# Patient Record
Sex: Male | Born: 1970
Health system: Southern US, Community
[De-identification: ages and names within clinical notes are randomized; demographics above are authoritative.]

## PROBLEM LIST (undated history)

## (undated) DIAGNOSIS — I6529 Occlusion and stenosis of unspecified carotid artery: Secondary | ICD-10-CM

## (undated) DIAGNOSIS — E7800 Pure hypercholesterolemia, unspecified: Secondary | ICD-10-CM

## (undated) DIAGNOSIS — T7840XA Allergy, unspecified, initial encounter: Secondary | ICD-10-CM

## (undated) DIAGNOSIS — Z720 Tobacco use: Secondary | ICD-10-CM

## (undated) DIAGNOSIS — I739 Peripheral vascular disease, unspecified: Secondary | ICD-10-CM

## (undated) DIAGNOSIS — I2510 Atherosclerotic heart disease of native coronary artery without angina pectoris: Secondary | ICD-10-CM

## (undated) DIAGNOSIS — K219 Gastro-esophageal reflux disease without esophagitis: Secondary | ICD-10-CM

## (undated) DIAGNOSIS — I10 Essential (primary) hypertension: Secondary | ICD-10-CM

## (undated) DIAGNOSIS — I219 Acute myocardial infarction, unspecified: Secondary | ICD-10-CM

## (undated) HISTORY — DX: Occlusion and stenosis of unspecified carotid artery: I65.29

## (undated) HISTORY — PX: LACERATION REPAIR: SHX5168

## (undated) HISTORY — DX: Tobacco use: Z72.0

## (undated) HISTORY — DX: Allergy, unspecified, initial encounter: T78.40XA

## (undated) HISTORY — DX: Peripheral vascular disease, unspecified: I73.9

---

## 2001-01-14 ENCOUNTER — Ambulatory Visit (HOSPITAL_COMMUNITY): Admission: RE | Admit: 2001-01-14 | Discharge: 2001-01-14 | Payer: Self-pay | Admitting: Family Medicine

## 2001-01-14 ENCOUNTER — Encounter: Payer: Self-pay | Admitting: Family Medicine

## 2006-12-12 ENCOUNTER — Emergency Department (HOSPITAL_COMMUNITY): Admission: EM | Admit: 2006-12-12 | Discharge: 2006-12-13 | Payer: Self-pay | Admitting: Emergency Medicine

## 2007-11-06 ENCOUNTER — Emergency Department (HOSPITAL_COMMUNITY): Admission: EM | Admit: 2007-11-06 | Discharge: 2007-11-07 | Payer: Self-pay | Admitting: Emergency Medicine

## 2010-10-20 LAB — POCT I-STAT, CHEM 8
BUN: 11
Calcium, Ion: 1.19
Chloride: 105
Creatinine, Ser: 1.2
Glucose, Bld: 102 — ABNORMAL HIGH
HCT: 45
Hemoglobin: 15.3
Potassium: 4
Sodium: 141
TCO2: 31

## 2010-10-28 LAB — BASIC METABOLIC PANEL
BUN: 7
CO2: 28
Calcium: 8.6
Chloride: 104
Creatinine, Ser: 1.12
GFR calc Af Amer: >60
GFR calc non Af Amer: >60
Glucose, Bld: 105 — ABNORMAL HIGH
Potassium: 3.7
Sodium: 139

## 2010-10-28 LAB — DIFFERENTIAL
Basophils Absolute: 0.1
Basophils Relative: 1
Eosinophils Absolute: 0.1 — ABNORMAL LOW
Eosinophils Relative: 1
Lymphocytes Relative: 18
Lymphs Abs: 1.8
Monocytes Absolute: 1.3 — ABNORMAL HIGH
Monocytes Relative: 13 — ABNORMAL HIGH
Neutro Abs: 6.5
Neutrophils Relative %: 67

## 2010-10-28 LAB — CBC
HCT: 46.6
Hemoglobin: 16.2
MCHC: 34.7
MCV: 85.7
Platelets: 183
RBC: 5.44
RDW: 13.9
WBC: 9.7

## 2012-07-14 ENCOUNTER — Ambulatory Visit: Payer: Self-pay | Admitting: Family Medicine

## 2012-07-14 VITALS — BP 110/66 | HR 81 | Temp 97.9°F | Resp 16 | Ht 69.25 in | Wt 177.2 lb

## 2012-07-14 DIAGNOSIS — Z0000 Encounter for general adult medical examination without abnormal findings: Secondary | ICD-10-CM

## 2012-07-14 DIAGNOSIS — Z0289 Encounter for other administrative examinations: Secondary | ICD-10-CM

## 2012-07-14 NOTE — Patient Instructions (Addendum)
Good to see you today!  You did have trace protein in your urine- please follow up with your regular doctor.  You should have a blood test to check your kidney function

## 2012-07-14 NOTE — Progress Notes (Signed)
[facility] [address] [place] [address] [phone]  Date:  07/14/2012   Name:  [name] [name]   DOB:  Jun 18, 1970   MRN:  [id number]  PCP:  No PCP Per Patient    Chief Complaint: dot   History of Present Illness:  [name] [name] is a 42 y.o. very pleasant male patient who presents with the following:  Here today for a DOT exam.  He has a history of AR and uses nasacort- otherwise quite healhty.   He is a long distance Naval architect.   Pan negative health history on DOT form   There are no active problems to display for this patient.   Past Medical History  Diagnosis Date  . Allergy     History reviewed. No pertinent past surgical history.  History  Substance Use Topics  . Smoking status: Former Games developer  . Smokeless tobacco: Not on file     Comment: uses e cigarette  . Alcohol Use: No    Family History  Problem Relation Age of Onset  . Cancer Father     Allergies  Allergen Reactions  . Codeine     Migraine     Medication list has been reviewed and updated.  No current outpatient prescriptions on file prior to visit.   No current facility-administered medications on file prior to visit.    Review of Systems:  As per HPI- otherwise negative.   Physical Examination: Filed Vitals:   07/14/12 0812  BP: 110/66  Pulse: 81  Temp: 97.9 F (36.6 C)  Resp: 16   Filed Vitals:   07/14/12 0812  Height: 5' 9.25" (1.759 m)  Weight: 177 lb 3.2 oz (80.377 kg)   Body mass index is 25.98 kg/(m^2). Ideal Body Weight: Weight in (lb) to have BMI = 25: 170.2  GEN: WDWN, NAD, Non-toxic, A & O x 3 HEENT: Atraumatic, Normocephalic. Neck supple. No masses, No LAD.  Bilateral TM wnl, oropharynx normal.  PEERL,EOMI.   Ears and Nose: No external deformity. CV: RRR, No M/G/R. No JVD. No thrill. No extra heart sounds. PULM: CTA B, no wheezes, crackles, rhonchi. No retractions. No resp. distress. No accessory muscle use. ABD: S, NT, ND,  +BS. No rebound. No HSM. EXTR: No c/c/e.  Normal strength, ROM and DTR all extremities  NEURO Normal gait.  PSYCH: Normally interactive. Conversant. Not depressed or anxious appearing.  Calm demeanor.  No inguinal hernia  Assessment and Plan: Physical exam  Cleared for 3 year DOT card.  Follow- up trace proteinuria with personal physician.    Signed Abbe Amsterdam, MD

## 2012-08-04 ENCOUNTER — Encounter: Payer: Self-pay | Admitting: Family Medicine

## 2013-03-28 ENCOUNTER — Ambulatory Visit (INDEPENDENT_AMBULATORY_CARE_PROVIDER_SITE_OTHER): Payer: BC Managed Care – PPO | Admitting: Internal Medicine

## 2013-03-28 VITALS — BP 126/80 | HR 67 | Temp 98.6°F | Resp 16 | Ht 69.25 in | Wt 167.8 lb

## 2013-03-28 DIAGNOSIS — F17200 Nicotine dependence, unspecified, uncomplicated: Secondary | ICD-10-CM

## 2013-03-28 DIAGNOSIS — R0989 Other specified symptoms and signs involving the circulatory and respiratory systems: Secondary | ICD-10-CM

## 2013-03-28 DIAGNOSIS — R002 Palpitations: Secondary | ICD-10-CM

## 2013-03-28 DIAGNOSIS — R0602 Shortness of breath: Secondary | ICD-10-CM

## 2013-03-28 LAB — POCT CBC
Granulocyte percent: 64.9 %G (ref 37–80)
HCT, POC: 49.7 % (ref 43.5–53.7)
HEMOGLOBIN: 16.1 g/dL (ref 14.1–18.1)
Lymph, poc: 3.1 (ref 0.6–3.4)
MCH: 28.9 pg (ref 27–31.2)
MCHC: 32.4 g/dL (ref 31.8–35.4)
MCV: 89.3 fL (ref 80–97)
MID (CBC): 0.9 (ref 0–0.9)
MPV: 9.1 fL (ref 0–99.8)
PLATELET COUNT, POC: 266 10*3/uL (ref 142–424)
POC GRANULOCYTE: 7.4 — AB (ref 2–6.9)
POC LYMPH PERCENT: 27.5 %L (ref 10–50)
POC MID %: 7.6 % (ref 0–12)
RBC: 5.57 M/uL (ref 4.69–6.13)
RDW, POC: 14.7 %
WBC: 11.4 10*3/uL — AB (ref 4.6–10.2)

## 2013-03-28 NOTE — Progress Notes (Signed)
   Subjective:    Patient ID: [name] [name], male    DOB: 02/05/1970, 43 y.o.   MRN: [id number]  HPI 43 year old male presents with palpitations and SOB. No intense chest pain; sometimes he will get a feeling in his chest like someone is poking or a small shock to his chest. Pt has previously had them off and on, but they have been steady for the past three weeks. No hx of hypertension. Pt takes no medications. He currently smokes 2 cigarettes a day then uses an e-cig the rest of the day. No known heart disease in his family hx. When he was 13 or 14 they said he thought he had a heart murmur. They ran tests on him and said they could not find the heart murmur so they cleared him.   fhx of stroke Review of Systems     Objective:   Physical Exam  Vitals reviewed. Constitutional: He is oriented to person, place, and time. He appears well-developed and well-nourished. No distress.  HENT:  Head: Normocephalic.  Eyes: EOM are normal. Pupils are equal, round, and reactive to light.  Neck: Normal range of motion. Neck supple.  Cardiovascular: Regular rhythm, normal heart sounds and intact distal pulses.  Tachycardia present.   3/6 left carotid bruit 4/6 left femoral bruit 3/6 lower abdominal bruit  Pulmonary/Chest: Effort normal and breath sounds normal.  Abdominal: Soft. He exhibits no mass. There is no tenderness.  Neurological: He is alert and oriented to person, place, and time. No cranial nerve deficit. He exhibits normal muscle tone. Coordination normal.  Psychiatric: He has a normal mood and affect.   Screening labs       Assessment & Plan:  Scattered vascular bruits Palpitations/PR interval .12 Smoker/SOB intermittent Refer to cardiology. Quit smoking

## 2013-03-28 NOTE — Patient Instructions (Signed)
Palpitations  A palpitation is the feeling that your heartbeat is irregular or is faster than normal. It may feel like your heart is fluttering or skipping a beat. Palpitations are usually not a serious problem. However, in some cases, you may need further medical evaluation. CAUSES  Palpitations can be caused by:  Smoking.  Caffeine or other stimulants, such as diet pills or energy drinks.  Alcohol.  Stress and anxiety.  Strenuous physical activity.  Fatigue.  Certain medicines.  Heart disease, especially if you have a history of arrhythmias. This includes atrial fibrillation, atrial flutter, or supraventricular tachycardia.  An improperly working pacemaker or defibrillator. DIAGNOSIS  To find the cause of your palpitations, your caregiver will take your history and perform a physical exam. Tests may also be done, including:  Electrocardiography (ECG). This test records the heart's electrical activity.  Cardiac monitoring. This allows your caregiver to monitor your heart rate and rhythm in real time.  Holter monitor. This is a portable device that records your heartbeat and can help diagnose heart arrhythmias. It allows your caregiver to track your heart activity for several days, if needed.  Stress tests by exercise or by giving medicine that makes the heart beat faster. TREATMENT  Treatment of palpitations depends on the cause of your symptoms and can vary greatly. Most cases of palpitations do not require any treatment other than time, relaxation, and monitoring your symptoms. Other causes, such as atrial fibrillation, atrial flutter, or supraventricular tachycardia, usually require further treatment. HOME CARE INSTRUCTIONS   Avoid:  Caffeinated coffee, tea, soft drinks, diet pills, and energy drinks.  Chocolate.  Alcohol.  Stop smoking if you smoke.  Reduce your stress and anxiety. Things that can help you relax include:  A method that measures bodily functions so  you can learn to control them (biofeedback).  Yoga.  Meditation.  Physical activity such as swimming, jogging, or walking.  Get plenty of rest and sleep. SEEK MEDICAL CARE IF:   You continue to have a fast or irregular heartbeat beyond 24 hours.  Your palpitations occur more often. SEEK IMMEDIATE MEDICAL CARE IF:  You develop chest pain or shortness of breath.  You have a severe headache.  You feel dizzy, or you faint. MAKE SURE YOU:  Understand these instructions.  Will watch your condition.  Will get help right away if you are not doing well or get worse. Document Released: 01/03/2000 Document Revised: 05/02/2012 Document Reviewed: 03/06/2011 [facility] Patient Information 2014 [place], [place]. Smoking Cessation Quitting smoking is important to your health and has many advantages. However, it is not always easy to quit since nicotine is a very addictive drug. Often times, people try 3 times or more before being able to quit. This document explains the best ways for you to prepare to quit smoking. Quitting takes hard work and a lot of effort, but you can do it. ADVANTAGES OF QUITTING SMOKING  You will live longer, feel better, and live better.  Your body will feel the impact of quitting smoking almost immediately.  Within 20 minutes, blood pressure decreases. Your pulse returns to its normal level.  After 8 hours, carbon monoxide levels in the blood return to normal. Your oxygen level increases.  After 24 hours, the chance of having a heart attack starts to decrease. Your breath, hair, and body stop smelling like smoke.  After 48 hours, damaged nerve endings begin to recover. Your sense of taste and smell improve.  After 72 hours, the body is virtually free  of nicotine. Your bronchial tubes relax and breathing becomes easier.  After 2 to 12 weeks, lungs can hold more air. Exercise becomes easier and circulation improves.  The risk of having a heart attack, stroke,  cancer, or lung disease is greatly reduced.  After 1 year, the risk of coronary heart disease is cut in half.  After 5 years, the risk of stroke falls to the same as a nonsmoker.  After 10 years, the risk of lung cancer is cut in half and the risk of other cancers decreases significantly.  After 15 years, the risk of coronary heart disease drops, usually to the level of a nonsmoker.  If you are pregnant, quitting smoking will improve your chances of having a healthy baby.  The people you live with, especially any children, will be healthier.  You will have extra money to spend on things other than cigarettes. QUESTIONS TO THINK ABOUT BEFORE ATTEMPTING TO QUIT You may want to talk about your answers with your caregiver.  Why do you want to quit?  If you tried to quit in the past, what helped and what did not?  What will be the most difficult situations for you after you quit? How will you plan to handle them?  Who can help you through the tough times? Your family? Friends? A caregiver?  What pleasures do you get from smoking? What ways can you still get pleasure if you quit? Here are some questions to ask your caregiver:  How can you help me to be successful at quitting?  What medicine do you think would be best for me and how should I take it?  What should I do if I need more help?  What is smoking withdrawal like? How can I get information on withdrawal? GET READY  Set a quit date.  Change your environment by getting rid of all cigarettes, ashtrays, matches, and lighters in your home, car, or work. Do not let people smoke in your home.  Review your past attempts to quit. Think about what worked and what did not. GET SUPPORT AND ENCOURAGEMENT You have a better chance of being successful if you have help. You can get support in many ways.  Tell your family, friends, and co-workers that you are going to quit and need their support. Ask them not to smoke around you.  Get  individual, group, or telephone counseling and support. Programs are available at [facility] and health centers. Call your local health department for information about programs in your area.  Spiritual beliefs and practices may help some smokers quit.  Download a "quit meter" on your computer to keep track of quit statistics, such as how long you have gone without smoking, cigarettes not smoked, and money saved.  Get a self-help book about quitting smoking and staying off of tobacco. LEARN NEW SKILLS AND BEHAVIORS  Distract yourself from urges to smoke. Talk to someone, go for a walk, or occupy your time with a task.  Change your normal routine. Take a different route to work. Drink tea instead of coffee. Eat breakfast in a different place.  Reduce your stress. Take a hot bath, exercise, or read a book.  Plan something enjoyable to do every day. Reward yourself for not smoking.  Explore interactive web-based programs that specialize in helping you quit. GET MEDICINE AND USE IT CORRECTLY Medicines can help you stop smoking and decrease the urge to smoke. Combining medicine with the above behavioral methods and support can greatly increase  your chances of successfully quitting smoking.  Nicotine replacement therapy helps deliver nicotine to your body without the negative effects and risks of smoking. Nicotine replacement therapy includes nicotine gum, lozenges, inhalers, nasal sprays, and skin patches. Some may be available over-the-counter and others require a prescription.  Antidepressant medicine helps people abstain from smoking, but how this works is unknown. This medicine is available by prescription.  Nicotinic receptor partial agonist medicine simulates the effect of nicotine in your brain. This medicine is available by prescription. Ask your caregiver for advice about which medicines to use and how to use them based on your health history. Your caregiver will tell you what side  effects to look out for if you choose to be on a medicine or therapy. Carefully read the information on the package. Do not use any other product containing nicotine while using a nicotine replacement product.  RELAPSE OR DIFFICULT SITUATIONS Most relapses occur within the first 3 months after quitting. Do not be discouraged if you start smoking again. Remember, most people try several times before finally quitting. You may have symptoms of withdrawal because your body is used to nicotine. You may crave cigarettes, be irritable, feel very hungry, cough often, get headaches, or have difficulty concentrating. The withdrawal symptoms are only temporary. They are strongest when you first quit, but they will go away within 10 14 days. To reduce the chances of relapse, try to:  Avoid drinking alcohol. Drinking lowers your chances of successfully quitting.  Reduce the amount of caffeine you consume. Once you quit smoking, the amount of caffeine in your body increases and can give you symptoms, such as a rapid heartbeat, sweating, and anxiety.  Avoid smokers because they can make you want to smoke.  Do not let weight gain distract you. Many smokers will gain weight when they quit, usually less than 10 pounds. Eat a healthy diet and stay active. You can always lose the weight gained after you quit.  Find ways to improve your mood other than smoking. FOR MORE INFORMATION  www.smokefree.gov  Document Released: 12/30/2000 Document Revised: 07/07/2011 Document Reviewed: 04/16/2011 [facility] Patient Information 2014 [place], [place].

## 2013-03-29 LAB — COMPREHENSIVE METABOLIC PANEL
ALT: 27 U/L (ref 0–53)
AST: 103 U/L — AB (ref 0–37)
Albumin: 4.6 g/dL (ref 3.5–5.2)
Alkaline Phosphatase: 98 U/L (ref 39–117)
BILIRUBIN TOTAL: 0.6 mg/dL (ref 0.2–1.2)
BUN: 11 mg/dL (ref 6–23)
CALCIUM: 9 mg/dL (ref 8.4–10.5)
CHLORIDE: 104 meq/L (ref 96–112)
CO2: 25 mEq/L (ref 19–32)
CREATININE: 1.04 mg/dL (ref 0.50–1.35)
Glucose, Bld: 95 mg/dL (ref 70–99)
Potassium: 4.6 mEq/L (ref 3.5–5.3)
SODIUM: 139 meq/L (ref 135–145)
TOTAL PROTEIN: 7.5 g/dL (ref 6.0–8.3)

## 2013-03-29 LAB — LIPID PANEL
CHOL/HDL RATIO: 6.6 ratio
Cholesterol: 210 mg/dL — ABNORMAL HIGH (ref 0–200)
HDL: 32 mg/dL — AB (ref >39)
LDL CALC: 137 mg/dL — AB (ref 0–99)
Triglycerides: 203 mg/dL — ABNORMAL HIGH (ref <150)
VLDL: 41 mg/dL — ABNORMAL HIGH (ref 0–40)

## 2013-03-29 LAB — TSH: TSH: 1.885 u[IU]/mL (ref 0.350–4.500)

## 2013-04-10 ENCOUNTER — Emergency Department (HOSPITAL_COMMUNITY): Payer: BC Managed Care – PPO

## 2013-04-10 ENCOUNTER — Encounter (HOSPITAL_COMMUNITY): Payer: Self-pay | Admitting: Emergency Medicine

## 2013-04-10 DIAGNOSIS — R002 Palpitations: Secondary | ICD-10-CM | POA: Insufficient documentation

## 2013-04-10 DIAGNOSIS — F17200 Nicotine dependence, unspecified, uncomplicated: Secondary | ICD-10-CM | POA: Insufficient documentation

## 2013-04-10 DIAGNOSIS — IMO0002 Reserved for concepts with insufficient information to code with codable children: Secondary | ICD-10-CM | POA: Insufficient documentation

## 2013-04-10 DIAGNOSIS — Z79899 Other long term (current) drug therapy: Secondary | ICD-10-CM | POA: Insufficient documentation

## 2013-04-10 DIAGNOSIS — J40 Bronchitis, not specified as acute or chronic: Secondary | ICD-10-CM | POA: Insufficient documentation

## 2013-04-10 NOTE — ED Notes (Signed)
Pt. reports palpitations " heart racing " with left chest discomfort onset this evening  , no chest pain , slight SOB and occasional dry cough .

## 2013-04-11 ENCOUNTER — Emergency Department (HOSPITAL_COMMUNITY)
Admission: EM | Admit: 2013-04-11 | Discharge: 2013-04-11 | Disposition: A | Discharge status: Home / Self Care | Payer: BC Managed Care – PPO | Attending: Emergency Medicine | Admitting: Emergency Medicine

## 2013-04-11 DIAGNOSIS — J40 Bronchitis, not specified as acute or chronic: Secondary | ICD-10-CM

## 2013-04-11 DIAGNOSIS — R002 Palpitations: Secondary | ICD-10-CM

## 2013-04-11 LAB — CBC WITH DIFFERENTIAL/PLATELET
BASOS ABS: 0 10*3/uL (ref 0.0–0.1)
BASOS PCT: 0 % (ref 0–1)
EOS ABS: 0.4 10*3/uL (ref 0.0–0.7)
Eosinophils Relative: 3 % (ref 0–5)
HEMATOCRIT: 41 % (ref 39.0–52.0)
HEMOGLOBIN: 14.3 g/dL (ref 13.0–17.0)
LYMPHS ABS: 3.3 10*3/uL (ref 0.7–4.0)
Lymphocytes Relative: 23 % (ref 12–46)
MCH: 29.4 pg (ref 26.0–34.0)
MCHC: 34.9 g/dL (ref 30.0–36.0)
MCV: 84.4 fL (ref 78.0–100.0)
MONO ABS: 1 10*3/uL (ref 0.1–1.0)
Monocytes Relative: 7 % (ref 3–12)
NEUTROS ABS: 9.5 10*3/uL — AB (ref 1.7–7.7)
Neutrophils Relative %: 67 % (ref 43–77)
Platelets: 223 10*3/uL (ref 150–400)
RBC: 4.86 MIL/uL (ref 4.22–5.81)
RDW: 14.2 % (ref 11.5–15.5)
WBC: 14.2 10*3/uL — ABNORMAL HIGH (ref 4.0–10.5)

## 2013-04-11 LAB — I-STAT CHEM 8, ED
BUN: 14 mg/dL (ref 6–23)
CALCIUM ION: 1.2 mmol/L (ref 1.12–1.23)
CREATININE: 1.3 mg/dL (ref 0.50–1.35)
Chloride: 103 mEq/L (ref 96–112)
GLUCOSE: 95 mg/dL (ref 70–99)
HEMATOCRIT: 43 % (ref 39.0–52.0)
HEMOGLOBIN: 14.6 g/dL (ref 13.0–17.0)
Potassium: 4 mEq/L (ref 3.7–5.3)
Sodium: 143 mEq/L (ref 137–147)
TCO2: 28 mmol/L (ref 0–100)

## 2013-04-11 LAB — I-STAT TROPONIN, ED: Troponin i, poc: 0 ng/mL (ref 0.00–0.08)

## 2013-04-11 MED ORDER — PREDNISONE 50 MG PO TABS: 50.0000 mg | ORAL_TABLET | Freq: Every day | ORAL | Status: DC

## 2013-04-11 MED ORDER — ALBUTEROL SULFATE HFA 108 (90 BASE) MCG/ACT IN AERS
1.0000 | INHALATION_SPRAY | Freq: Four times a day (QID) | RESPIRATORY_TRACT | Status: DC | PRN
Start: 2013-04-11 — End: 2013-12-29

## 2013-04-11 NOTE — Discharge Instructions (Signed)
We saw you in the ER for the palpitations.  All the results in the ER are normal, labs and imaging - EXCEPT FOR THE XRAY SHOWS POSSIBLE BRONCHITIS LIKE FINDINGS. If you are having cough, wheezing - take the medications prescribed - otherwise, no need to fill those prescription. We are not sure what is causing your palpitations. The workup in the ER is not complete, and is limited to screening for life threatening and emergent conditions only, so please see a primary care doctor for further evaluation.   Bronchitis Bronchitis is inflammation of the airways that extend from the windpipe into the lungs (bronchi). The inflammation often causes mucus to develop, which leads to a cough. If the inflammation becomes severe, it may cause shortness of breath. CAUSES  Bronchitis may be caused by:   Viral infections.   Bacteria.   Cigarette smoke.   Allergens, pollutants, and other irritants.  SIGNS AND SYMPTOMS  The most common symptom of bronchitis is a frequent cough that produces mucus. Other symptoms include:  Fever.   Body aches.   Chest congestion.   Chills.   Shortness of breath.   Sore throat.  DIAGNOSIS  Bronchitis is usually diagnosed through a medical history and physical exam. Tests, such as chest X-rays, are sometimes done to rule out other conditions.  TREATMENT  You may need to avoid contact with whatever caused the problem (smoking, for example). Medicines are sometimes needed. These may include:  Antibiotics. These may be prescribed if the condition is caused by bacteria.  Cough suppressants. These may be prescribed for relief of cough symptoms.   Inhaled medicines. These may be prescribed to help open your airways and make it easier for you to breathe.   Steroid medicines. These may be prescribed for those with recurrent (chronic) bronchitis. HOME CARE INSTRUCTIONS  Get plenty of rest.   Drink enough fluids to keep your urine clear or pale yellow  (unless you have a medical condition that requires fluid restriction). Increasing fluids may help thin your secretions and will prevent dehydration.   Only take over-the-counter or prescription medicines as directed by your health care provider.  Only take antibiotics as directed. Make sure you finish them even if you start to feel better.  Avoid secondhand smoke, irritating chemicals, and strong fumes. These will make bronchitis worse. If you are a smoker, quit smoking. Consider using nicotine gum or skin patches to help control withdrawal symptoms. Quitting smoking will help your lungs heal faster.   Put a cool-mist humidifier in your bedroom at night to moisten the air. This may help loosen mucus. Change the water in the humidifier daily. You can also run the hot water in your shower and sit in the bathroom with the door closed for 5 10 minutes.   Follow up with your health care provider as directed.   Wash your hands frequently to avoid catching bronchitis again or spreading an infection to others.  SEEK MEDICAL CARE IF: Your symptoms do not improve after 1 week of treatment.  SEEK IMMEDIATE MEDICAL CARE IF:  Your fever increases.  You have chills.   You have chest pain.   You have worsening shortness of breath.   You have bloody sputum.  You faint.  You have lightheadedness.  You have a severe headache.   You vomit repeatedly. MAKE SURE YOU:   Understand these instructions.  Will watch your condition.  Will get help right away if you are not doing well or get worse. Document  Released: 01/05/2005 Document Revised: 10/26/2012 Document Reviewed: 08/30/2012 [facility] Patient Information 2014 [place], [place].  Palpitations  A palpitation is the feeling that your heartbeat is irregular or is faster than normal. It may feel like your heart is fluttering or skipping a beat. Palpitations are usually not a serious problem. However, in some cases, you may need further  medical evaluation. CAUSES  Palpitations can be caused by:  Smoking.  Caffeine or other stimulants, such as diet pills or energy drinks.  Alcohol.  Stress and anxiety.  Strenuous physical activity.  Fatigue.  Certain medicines.  Heart disease, especially if you have a history of arrhythmias. This includes atrial fibrillation, atrial flutter, or supraventricular tachycardia.  An improperly working pacemaker or defibrillator. DIAGNOSIS  To find the cause of your palpitations, your caregiver will take your history and perform a physical exam. Tests may also be done, including:  Electrocardiography (ECG). This test records the heart's electrical activity.  Cardiac monitoring. This allows your caregiver to monitor your heart rate and rhythm in real time.  Holter monitor. This is a portable device that records your heartbeat and can help diagnose heart arrhythmias. It allows your caregiver to track your heart activity for several days, if needed.  Stress tests by exercise or by giving medicine that makes the heart beat faster. TREATMENT  Treatment of palpitations depends on the cause of your symptoms and can vary greatly. Most cases of palpitations do not require any treatment other than time, relaxation, and monitoring your symptoms. Other causes, such as atrial fibrillation, atrial flutter, or supraventricular tachycardia, usually require further treatment. HOME CARE INSTRUCTIONS   Avoid:  Caffeinated coffee, tea, soft drinks, diet pills, and energy drinks.  Chocolate.  Alcohol.  Stop smoking if you smoke.  Reduce your stress and anxiety. Things that can help you relax include:  A method that measures bodily functions so you can learn to control them (biofeedback).  Yoga.  Meditation.  Physical activity such as swimming, jogging, or walking.  Get plenty of rest and sleep. SEEK MEDICAL CARE IF:   You continue to have a fast or irregular heartbeat beyond 24  hours.  Your palpitations occur more often. SEEK IMMEDIATE MEDICAL CARE IF:  You develop chest pain or shortness of breath.  You have a severe headache.  You feel dizzy, or you faint. MAKE SURE YOU:  Understand these instructions.  Will watch your condition.  Will get help right away if you are not doing well or get worse. Document Released: 01/03/2000 Document Revised: 05/02/2012 Document Reviewed: 03/06/2011 [facility] Patient Information 2014 [place], [place].

## 2013-04-13 NOTE — ED Provider Notes (Signed)
CSN: [id number]     Arrival date & time 04/10/13  2229 History   First MD Initiated Contact with Patient 04/11/13 0015     Chief Complaint  Patient presents with  . Palpitations     (Consider location/radiation/quality/duration/timing/severity/associated sxs/prior Treatment) Patient is a 43 y.o. male presenting with palpitations. The history is provided by the patient.  Palpitations Palpitations quality:  Fast Onset quality:  Sudden Timing:  Constant Progression:  Unable to specify Chronicity:  New Context: not anxiety, not appetite suppressants, not caffeine, not exercise, not illicit drugs, not nicotine and not stimulant use   Relieved by:  Nothing Ineffective treatments:  None tried Associated symptoms: chest pain   Associated symptoms: no cough, no diaphoresis, no dizziness, no leg pain, no orthopnea, no PND and no shortness of breath     Past Medical History  Diagnosis Date  . Allergy    History reviewed. No pertinent past surgical history. Family History  Problem Relation Age of Onset  . Cancer Father   . Stroke Mother    History  Substance Use Topics  . Smoking status: Current Every Day Smoker  . Smokeless tobacco: Not on file     Comment: uses e cigarette  . Alcohol Use: No    Review of Systems  Constitutional: Negative for diaphoresis, activity change and appetite change.  Respiratory: Negative for cough and shortness of breath.   Cardiovascular: Positive for chest pain and palpitations. Negative for orthopnea and PND.  Gastrointestinal: Negative for abdominal pain.  Genitourinary: Negative for dysuria.  Neurological: Negative for dizziness.      Allergies  Codeine  Home Medications   Current Outpatient Rx  Name  Route  Sig  Dispense  Refill  . atorvastatin (LIPITOR) 20 MG tablet   Oral   Take 20 mg by mouth daily.         . cilostazol (PLETAL) 100 MG tablet   Oral   Take 100 mg by mouth 2 (two) times daily.         [name]. esomeprazole  (NEXIUM) 20 MG capsule   Oral   Take 20 mg by mouth daily as needed (for stomach).         . naproxen sodium (ANAPROX) 220 MG tablet   Oral   Take 440 mg by mouth daily as needed (for pain).         [name]. albuterol (PROVENTIL HFA;VENTOLIN HFA) 108 (90 BASE) MCG/ACT inhaler   Inhalation   Inhale 1-2 puffs into the lungs every 6 (six) hours as needed for wheezing or shortness of breath.   1 Inhaler   0   . predniSONE (DELTASONE) 50 MG tablet   Oral   Take 1 tablet (50 mg total) by mouth daily.   5 tablet   0    BP 117/65  Pulse 68  Temp(Src) 98.3 F (36.8 C) (Oral)  Resp 12  Ht 5\' 9"  (1.753 m)  Wt 165 lb (74.844 kg)  BMI 24.36 kg/m2  SpO2 98% Physical Exam  Nursing note and vitals reviewed. Constitutional: He is oriented to person, place, and time. He appears well-developed.  HENT:  Head: Normocephalic and atraumatic.  Eyes: Conjunctivae and EOM are normal. Pupils are equal, round, and reactive to light.  Neck: Normal range of motion. Neck supple.  Cardiovascular: Normal rate and regular rhythm.   Pulmonary/Chest: Effort normal and breath sounds normal.  Abdominal: Soft. Bowel sounds are normal. He exhibits no distension. There is no tenderness. There is no rebound  and no guarding.  Neurological: He is alert and oriented to person, place, and time.  Skin: Skin is warm.    ED Course  Procedures (including critical care time) Labs Review Labs Reviewed  CBC WITH DIFFERENTIAL - Abnormal; Notable for the following:    WBC 14.2 (*)    Neutro Abs 9.5 (*)    All other components within normal limits  I-STAT TROPOININ, ED  I-STAT CHEM 8, ED   Imaging Review No results found.   EKG Interpretation   Date/Time:  Monday April 10 2013 22:48:38 EDT Ventricular Rate:  91 PR Interval:  118 QRS Duration: 96 QT Interval:  356 QTC Calculation: 437 R Axis:   77 Text Interpretation:  Normal sinus rhythm Cannot rule out Anterior infarct  , age undetermined Abnormal ECG  Confirmed by Rhunette Croft, MD, Janey Genta [phone])  on 04/11/2013 12:38:11 AM      MDM   Final diagnoses:  Bronchitis  Palpitations    Pt comes in with chest palpitations and chest pain. Smoker, caffeine user - no other stimulant use. No hard cardiac risk factors. No palpitations actively, with the palpitations there was no fainting.  Will d.c EKG appears reassuring.  Derwood Kaplan, MD 04/13/13 [phone]

## 2013-05-01 ENCOUNTER — Encounter (HOSPITAL_COMMUNITY): Payer: Self-pay

## 2013-05-16 ENCOUNTER — Encounter (HOSPITAL_COMMUNITY)
Admission: RE | Disposition: A | Discharge status: Home / Self Care | Payer: Self-pay | Source: Ambulatory Visit | Attending: Cardiology

## 2013-05-16 ENCOUNTER — Ambulatory Visit (HOSPITAL_COMMUNITY)
Admission: RE | Admit: 2013-05-16 | Discharge: 2013-05-16 | Disposition: A | Discharge status: Home / Self Care | Payer: BC Managed Care – PPO | Source: Ambulatory Visit | Attending: Cardiology | Admitting: Cardiology

## 2013-05-16 DIAGNOSIS — I739 Peripheral vascular disease, unspecified: Secondary | ICD-10-CM | POA: Diagnosis present

## 2013-05-16 DIAGNOSIS — I708 Atherosclerosis of other arteries: Secondary | ICD-10-CM | POA: Insufficient documentation

## 2013-05-16 DIAGNOSIS — J309 Allergic rhinitis, unspecified: Secondary | ICD-10-CM | POA: Insufficient documentation

## 2013-05-16 DIAGNOSIS — F17200 Nicotine dependence, unspecified, uncomplicated: Secondary | ICD-10-CM | POA: Insufficient documentation

## 2013-05-16 DIAGNOSIS — K219 Gastro-esophageal reflux disease without esophagitis: Secondary | ICD-10-CM | POA: Insufficient documentation

## 2013-05-16 DIAGNOSIS — E785 Hyperlipidemia, unspecified: Secondary | ICD-10-CM | POA: Insufficient documentation

## 2013-05-16 HISTORY — PX: LOWER EXTREMITY ANGIOGRAM: SHX5508

## 2013-05-16 LAB — POCT ACTIVATED CLOTTING TIME
Activated Clotting Time: 232 seconds
Activated Clotting Time: 177 seconds

## 2013-05-16 SURGERY — ANGIOGRAM, LOWER EXTREMITY
Anesthesia: LOCAL

## 2013-05-16 MED ORDER — HEPARIN SODIUM (PORCINE) 1000 UNIT/ML IJ SOLN
INTRAMUSCULAR | Status: AC
  Filled 2013-05-16: qty 1

## 2013-05-16 MED ORDER — HYDROMORPHONE HCL PF 1 MG/ML IJ SOLN
0.5000 mg | INTRAMUSCULAR | Status: DC | PRN
  Administered 2013-05-16: 0.5 mg via INTRAVENOUS

## 2013-05-16 MED ORDER — LIDOCAINE HCL (PF) 1 % IJ SOLN
INTRAMUSCULAR | Status: AC
  Filled 2013-05-16: qty 30

## 2013-05-16 MED ORDER — MIDAZOLAM HCL 2 MG/2ML IJ SOLN
INTRAMUSCULAR | Status: AC
  Filled 2013-05-16: qty 2

## 2013-05-16 MED ORDER — HEPARIN (PORCINE) IN NACL 2-0.9 UNIT/ML-% IJ SOLN
INTRAMUSCULAR | Status: AC
  Filled 2013-05-16: qty 1000

## 2013-05-16 MED ORDER — HYDROMORPHONE HCL PF 1 MG/ML IJ SOLN
INTRAMUSCULAR | Status: AC
  Filled 2013-05-16: qty 1

## 2013-05-16 MED ORDER — SODIUM CHLORIDE 0.9 % IV SOLN
INTRAVENOUS | Status: DC
  Administered 2013-05-16: 08:00:00 via INTRAVENOUS

## 2013-05-16 MED ORDER — SODIUM CHLORIDE 0.9 % IV SOLN: Freq: Once | INTRAVENOUS | Status: DC

## 2013-05-16 NOTE — Interval H&P Note (Signed)
History and Physical Interval Note:  05/16/2013 10:18 AM  [name] [name]  has presented today for surgery, with the diagnosis of pain when walking   The various methods of treatment have been discussed with the patient and family. After consideration of risks, benefits and other options for treatment, the patient has consented to  Procedure(s): LOWER EXTREMITY ANGIOGRAM (N/A) and possible PTA as a surgical intervention .  The patient's history has been reviewed, patient examined, no change in status, stable for surgery.  I have reviewed the patient's chart and labs.  Questions were answered to the patient's satisfaction.     Pamella PertJagadeesh R Young Mulvey

## 2013-05-16 NOTE — Discharge Instructions (Signed)
Angiography, Care After °Refer to this sheet in the next few weeks. These instructions provide you with information on caring for yourself after your procedure. Your health care provider may also give you more specific instructions. Your treatment has been planned according to current medical practices, but problems sometimes occur. Call your health care provider if you have any problems or questions after your procedure.  °WHAT TO EXPECT AFTER THE PROCEDURE °After your procedure, it is typical to have the following sensations: °· Minor discomfort or tenderness and a small bump at the catheter insertion site. The bump should usually decrease in size and tenderness within 1 to 2 weeks. °· Any bruising will usually fade within 2 to 4 weeks. °HOME CARE INSTRUCTIONS  °· You may need to keep taking blood thinners if they were prescribed for you. Only take over-the-counter or prescription medicines for pain, fever, or discomfort as directed by your health care provider. °· Do not apply powder or lotion to the site. °· Do not sit in a bathtub, swimming pool, or whirlpool for 5 to 7 days. °· You may shower 24 hours after the procedure. Remove the bandage (dressing) and gently wash the site with plain soap and water. Gently pat the site dry. °· Inspect the site at least twice daily. °· Limit your activity for the first 48 hours. Do not bend, squat, or lift anything over 20 lb (9 kg) or as directed by your health care provider. °· Do not drive home if you are discharged the day of the procedure. Have someone else drive you. Follow instructions about when you can drive or return to work. °SEEK MEDICAL CARE IF: °· You get lightheaded when standing up. °· You have drainage (other than a small amount of blood on the dressing). °· You have chills. °· You have a fever. °· You have redness, warmth, swelling, or pain at the insertion site. °SEEK IMMEDIATE MEDICAL CARE IF:  °· You develop chest pain or shortness of breath, feel faint,  or pass out. °· You have bleeding, swelling larger than a walnut, or drainage from the catheter insertion site. °· You develop pain, discoloration, coldness, or severe bruising in the leg or arm that held the catheter. °· You develop bleeding from any other place, such as the bowels. You may see bright red blood in your urine or stools, or your stools may appear black and tarry. °· You have heavy bleeding from the site. If this happens, hold pressure on the site. °MAKE SURE YOU: °· Understand these instructions. °· Will watch your condition. °· Will get help right away if you are not doing well or get worse. °Document Released: 07/24/2004 Document Revised: 09/07/2012 Document Reviewed: 05/30/2012 °Exit[employer]® Patient Information ©2014 [employer]. ° °

## 2013-05-16 NOTE — CV Procedure (Signed)
Procedure performed: 1. Bilateral femoral arterial access, abdominal aortogram, abdominal aortogram with bifemoral runoff. 2. PTA and stenting of the left common iliac artery with implantation of 2 overlapping 9.0 x 29 mm and a 8.0 x 19 mm Omnilink Elite  balloon expandable stent, PTA and stenting of the left external iliac artery with implantation of a self-expanding 8.0 x 18 mm absolute pro stent.  Indication: Patient is a 43 year old Caucasian male with history of tobacco use disorder who was referred for evaluation of peripheral arterial disease and chest discomfort and claudication. Outpatient Doppler evaluation had revealed severe damping flow across the left leg, suggestive of inflow disease. Hence was brought to the peripheral angiography suite to evaluate his peripheral anatomy.  Angiographic data: Abdominal aortogram revealed no significant disease in the abdominal aorta. The aortoiliac bifurcation was widely patent. There were 2 renal arteries one on either sides, widely patent. The mesenteric vessels filled quickly and did not appear to have any significant atherosclerotic changes.  Abdominal aortogram with bifemoral runoff: The right common iliac artery showed about a 20% mild luminal irregularity. Right femoral artery showed mild disease. SFA also showed mild disease. Below the knee there was two-vessel runoff in the form of peroneal and posterior tibial artery, and the tibial artery is occluded.  Left common iliac artery showed a high-grade 80-90% stenosis, left internal iliac artery was widely patent, left external iliac artery at the origin and ostium showed a high-grade 90% stenosis followed by a tandem 80% stenosis. The left femoral artery and left superficial femoral artery showed mild disease. Below the left knee there was two-vessel runoff in the form of peroneal and posterior tibial artery with an irritable artery occluded in the proximal segment.  Interventional data:  Successful  PTA and stenting of the left common iliac artery with implantation of 2 overlapping 9.0 x 29 mm and a 8.0 x 19 mm Omnilink Elite  balloon expandable stent, PTA and stenting of the left external iliac artery with implantation of a self-expanding 8.0 x 18 mm absolute pro stent. No residual stenosis was evident. Excellent result was evident.  Recommendation: Patient needs aggressive risk modification, smoking cessation. He'll be discharged home today with outpatient followup.  Technique of the procedure: Under sterile precautions using a 5 French right femoral arterial access, a 5 French Omni Flush catheter was advanced into the abdominal aorta. Abdominal aortogram was performed. The same catheter was utilized to perform abdominal aortogram with bifemoral runoff.  Technique of intervention: Using 3000 units of heparin and keeping ACT greater than 200, access the left femoral artery and a 7 French bright tip 35 cm sheath was introduced into the left common femoral artery and into the left distal iliac artery. The lesion in the common and external iliac artery was carefully evaluated, a Viper trek ruler was utilized to measure the lesion length. This was followed by stenting of the left common iliac artery with implantation of a balloon expandable stent, 9.0 x 28 mm Omnilink deployed at 10 atmospheric pressure for 60 seconds. I then implanted a self-expanding 8.0 x 18 mm absolute pro into the left external iliac artery. I spared the internal iliac artery origin. The stenosis at the site was then ballooned with the 9.0 x 28 mm Omnilink stent balloon at 2 atmosphere pressure was the self-expanding stent was deployed. The stent was dilated with the same balloon throughout the lesion length. Post-angioplasty, there was a antegrade dissection noted at the outflow of the proximal common iliac artery stent. This  was covered with a 8.0 x 19 mm Omnilink Elite balloon expandable stent which was deployed at 10 atmospheric  pressure x2. Having performed this angiography revealed excellent results. The sheath was Sutured in place., Omni flush catheter pulled out of the body over [employer] wire. All the procedure was performed via Versacore wire support. A total of 280 cc of contrast was utilized for diagnostic and interventional procedure.

## 2013-05-16 NOTE — H&P (Signed)
  Please see office visit notes for complete details of HPI.  

## 2013-05-26 ENCOUNTER — Encounter: Payer: Self-pay | Admitting: Internal Medicine

## 2013-07-18 ENCOUNTER — Encounter: Payer: Self-pay | Admitting: Internal Medicine

## 2013-12-28 ENCOUNTER — Encounter (HOSPITAL_COMMUNITY): Payer: Self-pay | Admitting: Cardiology

## 2013-12-29 ENCOUNTER — Observation Stay (HOSPITAL_COMMUNITY)
Admission: EM | Admit: 2013-12-29 | Discharge: 2013-12-29 | Disposition: A | Discharge status: Home / Self Care | Payer: BC Managed Care – PPO | Attending: Internal Medicine | Admitting: Internal Medicine

## 2013-12-29 ENCOUNTER — Emergency Department (HOSPITAL_COMMUNITY): Payer: BC Managed Care – PPO

## 2013-12-29 ENCOUNTER — Encounter (HOSPITAL_COMMUNITY): Payer: Self-pay | Admitting: Emergency Medicine

## 2013-12-29 DIAGNOSIS — R079 Chest pain, unspecified: Principal | ICD-10-CM | POA: Diagnosis present

## 2013-12-29 DIAGNOSIS — Z7982 Long term (current) use of aspirin: Secondary | ICD-10-CM | POA: Insufficient documentation

## 2013-12-29 DIAGNOSIS — Z720 Tobacco use: Secondary | ICD-10-CM | POA: Diagnosis not present

## 2013-12-29 DIAGNOSIS — Z79899 Other long term (current) drug therapy: Secondary | ICD-10-CM | POA: Insufficient documentation

## 2013-12-29 DIAGNOSIS — I739 Peripheral vascular disease, unspecified: Secondary | ICD-10-CM | POA: Diagnosis present

## 2013-12-29 DIAGNOSIS — F17200 Nicotine dependence, unspecified, uncomplicated: Secondary | ICD-10-CM | POA: Diagnosis present

## 2013-12-29 LAB — CBC
HEMATOCRIT: 40.8 % (ref 39.0–52.0)
Hemoglobin: 14.2 g/dL (ref 13.0–17.0)
MCH: 29.1 pg (ref 26.0–34.0)
MCHC: 34.8 g/dL (ref 30.0–36.0)
MCV: 83.6 fL (ref 78.0–100.0)
PLATELETS: 231 10*3/uL (ref 150–400)
RBC: 4.88 MIL/uL (ref 4.22–5.81)
RDW: 14.1 % (ref 11.5–15.5)
WBC: 22 10*3/uL — AB (ref 4.0–10.5)

## 2013-12-29 LAB — RAPID URINE DRUG SCREEN, HOSP PERFORMED
AMPHETAMINES: NOT DETECTED
Barbiturates: NOT DETECTED
Benzodiazepines: NOT DETECTED
COCAINE: NOT DETECTED
Opiates: NOT DETECTED
TETRAHYDROCANNABINOL: NOT DETECTED

## 2013-12-29 LAB — BASIC METABOLIC PANEL
Anion gap: 16 — ABNORMAL HIGH (ref 5–15)
BUN: 11 mg/dL (ref 6–23)
CO2: 22 meq/L (ref 19–32)
CREATININE: 1.13 mg/dL (ref 0.50–1.35)
Calcium: 8.8 mg/dL (ref 8.4–10.5)
Chloride: 100 mEq/L (ref 96–112)
GFR calc Af Amer: >90 mL/min (ref >90)
GFR calc non Af Amer: 78 mL/min — ABNORMAL LOW (ref >90)
Glucose, Bld: 90 mg/dL (ref 70–99)
Potassium: 4.2 mEq/L (ref 3.7–5.3)
Sodium: 138 mEq/L (ref 137–147)

## 2013-12-29 LAB — TROPONIN I
Troponin I: <0.3 ng/mL (ref <0.30)
Troponin I: <0.3 ng/mL (ref <0.30)

## 2013-12-29 LAB — I-STAT TROPONIN, ED: Troponin i, poc: 0 ng/mL (ref 0.00–0.08)

## 2013-12-29 MED ORDER — METOPROLOL TARTRATE 25 MG PO TABS
25.0000 mg | ORAL_TABLET | Freq: Two times a day (BID) | ORAL | Status: DC
Start: 2013-12-29 — End: 2013-12-29

## 2013-12-29 MED ORDER — RANITIDINE HCL 150 MG PO TABS: 75.0000 mg | ORAL_TABLET | Freq: Every day | ORAL | Status: DC | PRN

## 2013-12-29 MED ORDER — ATORVASTATIN CALCIUM 20 MG PO TABS
20.0000 mg | ORAL_TABLET | Freq: Every day | ORAL | Status: DC
  Administered 2013-12-29: 20 mg via ORAL
  Filled 2013-12-29: qty 1

## 2013-12-29 MED ORDER — CILOSTAZOL 50 MG PO TABS
50.0000 mg | ORAL_TABLET | Freq: Two times a day (BID) | ORAL | Status: DC
  Administered 2013-12-29: 50 mg via ORAL
  Filled 2013-12-29: qty 1

## 2013-12-29 MED ORDER — NITROGLYCERIN 0.4 MG SL SUBL
0.4000 mg | SUBLINGUAL_TABLET | SUBLINGUAL | Status: DC | PRN
  Administered 2013-12-29: 0.4 mg via SUBLINGUAL
  Filled 2013-12-29: qty 1

## 2013-12-29 MED ORDER — ASPIRIN EC 81 MG PO TBEC
81.0000 mg | DELAYED_RELEASE_TABLET | Freq: Every day | ORAL | Status: DC
  Administered 2013-12-29: 81 mg via ORAL
  Filled 2013-12-29: qty 1

## 2013-12-29 MED ORDER — METOPROLOL TARTRATE 25 MG PO TABS
25.0000 mg | ORAL_TABLET | Freq: Two times a day (BID) | ORAL | Status: DC
Start: 2013-12-29 — End: 2015-01-30

## 2013-12-29 MED ORDER — NICOTINE 21 MG/24HR TD PT24
21.0000 mg | MEDICATED_PATCH | Freq: Every day | TRANSDERMAL | Status: DC
  Administered 2013-12-29: 21 mg via TRANSDERMAL
  Filled 2013-12-29 (×2): qty 1

## 2013-12-29 MED ORDER — ASPIRIN EC 81 MG PO TBEC: 81.0000 mg | DELAYED_RELEASE_TABLET | Freq: Every day | ORAL | Status: DC

## 2013-12-29 MED ORDER — ALBUTEROL SULFATE HFA 108 (90 BASE) MCG/ACT IN AERS
1.0000 | INHALATION_SPRAY | Freq: Four times a day (QID) | RESPIRATORY_TRACT | Status: DC | PRN
Start: 2013-12-29 — End: 2015-01-30

## 2013-12-29 MED ORDER — ATORVASTATIN CALCIUM 20 MG PO TABS: 20.0000 mg | ORAL_TABLET | Freq: Every day | ORAL | Status: DC

## 2013-12-29 MED ORDER — CILOSTAZOL 100 MG PO TABS: 50.0000 mg | ORAL_TABLET | Freq: Two times a day (BID) | ORAL | Status: DC

## 2013-12-29 MED ORDER — ESOMEPRAZOLE MAGNESIUM 20 MG PO CPDR: 20.0000 mg | DELAYED_RELEASE_CAPSULE | Freq: Every day | ORAL | Status: DC | PRN

## 2013-12-29 MED ORDER — METOPROLOL TARTRATE 25 MG PO TABS
25.0000 mg | ORAL_TABLET | Freq: Two times a day (BID) | ORAL | Status: DC
  Administered 2013-12-29: 25 mg via ORAL
  Filled 2013-12-29: qty 1

## 2013-12-29 MED ORDER — TRIAMCINOLONE ACETONIDE 55 MCG/ACT NA AERO: 2.0000 | INHALATION_SPRAY | Freq: Every day | NASAL | Status: DC | PRN

## 2013-12-29 MED ORDER — PANTOPRAZOLE SODIUM 40 MG PO TBEC
40.0000 mg | DELAYED_RELEASE_TABLET | Freq: Every day | ORAL | Status: DC
  Administered 2013-12-29: 40 mg via ORAL
  Filled 2013-12-29: qty 1

## 2013-12-29 MED ORDER — ASPIRIN 81 MG PO CHEW
324.0000 mg | CHEWABLE_TABLET | Freq: Once | ORAL | Status: AC
  Administered 2013-12-29: 324 mg via ORAL
  Filled 2013-12-29: qty 4

## 2013-12-29 MED ORDER — ENOXAPARIN SODIUM 40 MG/0.4ML ~~LOC~~ SOLN: 40.0000 mg | SUBCUTANEOUS | Status: DC

## 2013-12-29 NOTE — Discharge Instructions (Addendum)
Please attend your previously scheduled appointment with Dr. Jacinto HalimGanji on February 01, 2013. He is aware of this hospital visit and your chest pain last night.  Please also fill your prescriptions so that you can take your medications.

## 2013-12-29 NOTE — Care Management Note (Signed)
  Page 1 of 1   12/29/2013     1:10:13 PM CARE MANAGEMENT NOTE 12/29/2013  Patient:  [name] [name],[name] [name]   Account Number:  [id number]  Date Initiated:  12/29/2013  Documentation initiated by:  Ronny FlurryWILE,Jerene Yeager  Subjective/Objective Assessment:     Action/Plan:   Anticipated DC Date:  12/29/2013   Anticipated DC Plan:  HOME/SELF CARE      DC Planning Services  [facility]      Choice offered to / List presented to:             Status of service:   Medicare Important Message given?   (If response is "NO", the following Medicare IM given date fields will be blank) Date Medicare IM given:   Medicare IM given by:   Date Additional Medicare IM given:   Additional Medicare IM given by:    Discharge Disposition:    Per UR Regulation:    If discussed at Long Length of Stay Meetings, dates discussed:    Comments:  12-29-13 [facility] , scheduled Monday Dec 21 at 1030 . Patient can go there today to have prescriptions filled . 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 [facility] has [facility] contact information. Ronny FlurryHeather Hailea Eaglin RN BSN

## 2013-12-29 NOTE — Progress Notes (Signed)
Subjective: [name] feels "much better" this morning. He has had no more chest pain or pressure in his chest or shoulder. He had a very difficult time sleeping in the hospital and is experiencing discomfort at the site of his IV. He would like to go home.  Objective: Vital signs in last 24 hours: Filed Vitals:   12/29/13 0330 12/29/13 0400 12/29/13 0459 12/29/13 1116  BP: 107/71 118/74 137/69 108/58  Pulse: 65 56 56   Temp:   97.7 F (36.5 C)   TempSrc:   Oral   Resp: 11 14 13    Height:   5\' 9"  (1.753 m)   Weight:   171 lb (77.565 kg)   SpO2: 98% 96% 99%    Weight change:   Intake/Output Summary (Last 24 hours) at 12/29/13 1143 Last data filed at 12/29/13 1045  Gross per 24 hour  Intake      0 ml  Output    625 ml  Net   -625 ml   Physical Exam: General: in NAD, resting on his side in bed, appears comfortable HEENT: [place]/AT, EOMi, MMM Cardiac: RRR, normal S1S2, no MRG, no chest [place] tenderness Pulmonary: CTAB, non-labored breathing Abdominal: nondistended, BS+, soft, nontender Extremities: no edema BLE, no shoulder tenderness, no pain on motion Neurologic: A&Ox3, grossly intact, appears frustrated  Lab Results: Basic Metabolic Panel:  Recent Labs Lab 12/29/13 0120  NA 138  K 4.2  CL 100  CO2 22  GLUCOSE 90  BUN 11  CREATININE 1.13  CALCIUM 8.8   CBC:  Recent Labs Lab 12/29/13 0120  WBC 22.0*  HGB 14.2  HCT 40.8  MCV 83.6  PLT 231   Cardiac Enzymes:  Recent Labs Lab 12/29/13 0625 12/29/13 0731  TROPONINI <0.30 <0.30   Patient refusing third blood draw  Urine Drug Screen: Drugs of Abuse     Component Value Date/Time   LABOPIA NONE DETECTED 12/29/2013 1037   COCAINSCRNUR NONE DETECTED 12/29/2013 1037   LABBENZ NONE DETECTED 12/29/2013 1037   AMPHETMU NONE DETECTED 12/29/2013 1037   THCU NONE DETECTED 12/29/2013 1037   LABBARB NONE DETECTED 12/29/2013 1037    Studies/Results: Dg Chest Port 1 View  12/29/2013   CLINICAL DATA:  Left  upper chest pain began while walking. Shortness of breath.  EXAM: PORTABLE CHEST - 1 VIEW  COMPARISON:  04/10/2013  FINDINGS: Elevation of right hemidiaphragm with suggestion of atelectasis in the right lung base. No airspace disease in the lungs. No blunting of costophrenic angles. No pneumothorax. Normal heart size and pulmonary vascularity.  IMPRESSION: Atelectasis in the right lung base with elevation of right hemidiaphragm.   Electronically Signed   By: Burman NievesWilliam  Stevens M.D.   On: 12/29/2013 01:48   Medications: I have reviewed the patient's current medications. Scheduled Meds: . aspirin EC  81 mg Oral Daily  . atorvastatin  20 mg Oral Daily  . cilostazol  50 mg Oral BID  . enoxaparin (LOVENOX) injection  40 mg Subcutaneous Q24H  . metoprolol tartrate  25 mg Oral BID  . nicotine  21 mg Transdermal Daily  . pantoprazole  40 mg Oral Daily   Continuous Infusions:  PRN Meds:.nitroGLYCERIN Assessment/Plan: Principal Problem:   Chest pain Active Problems:   Nicotine dependence   Claudication in peripheral vascular disease  Mr. Malbrough is a 43 yo man with a history of peripheral artery disease (s/p stents to his left common iliac and left external iliac arteries in April 2015), tobacco abuse (2 packs  per day), and HLD who presents with atypical chest pain; he therefore has significant cardiac risk factors. However, his episode occurred in the setting of stress and its brevity is less convincing for AMI. He has had 2 EKGs that are not concerning for acute changes and 2 negative troponins; he is refusing the 3rd blood draw. His chest pain is completely resolved. is followed by Dr. Jacinto HalimGanji, who reviewed his history on this visit, is comfortable with his discharge with his previously scheduled follow up in early January.  Atypical Chest Pain: Chest pain brought on by emotional stress and physical exertion. Described as 2/10 in severity, only lasting "a second" and intermittent, located in left shoulder  radiating to axilla, and associated with palpitations and SOB. Patient does have significant cardiac risk factors including heavy smoking, peripheral artery disease, and hyperlipidemia. However, his presentation is very atypical and not convincing for ACS. He follows with Dr. Jacinto HalimGanji. Of note, patient had a nuclear stress test on 04/07/13 that showed no evidence of ischemia and an EF 54%, no Vangieson motion abnormalities. EKGs unchanged from previous. Troponins negative x2. CXR shows normal heart size and pulmonary vascularity. TIMI risk score 0-1 (1 for aspirin use, but has not been taking this medication). His chest pain is most likely due to anxiety. UDS negative. - Trend troponins - Nitro 0.4 mg as needed - ASA 81 mg - Lipitor 20 mg daily - Metoprolol 25 mg BID - Cardiac monitoring  Peripheral Vascular Disease: History of claudication and s/p stents to left common iliac and left external iliac arteries. Patient has Pletal 50 mg BID and Lipitor 20 mg daily listed on home meds, but patient states he has not taken any medications since August. 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 He is currently unemployed. He denies calf pain at rest, but reports some pain if walking for long distances.  - ContinuePletal 50 mg BID - Continue Lipitor 20 mg daily  HLD: Last lipid profile on 03/28/13 shows Chol 210, Trigly 203, HDL 32, and LDL 137. He is prescribed Lipitor 20 mg daily, but states he has not taken this medication for several months. Given his hx of PVD, patient should be on statin.  - Started Lipitor 20 mg daily; switch to lovastatin 20 mg even though it is lower intensity in that it is on the $4 list  Tobacco abuse: Patient currently smokes 2 packs per day. He notes increased smoking over past few days due to stress.  - Encourage smoking cessation  Care Management:  - Left message for CM; patient has  struggled to get his medications and does not have a PCP. Follows with Dr. Jacinto HalimGanji, cardiology, without insurance.  Diet: Heart healthy DVT/PE PPx: Lovenox SQ  Dispo: Disposition is deferred at this time, awaiting improvement of current medical problems.  Anticipated discharge in approximately 0 day(s).   The patient does not have a current PCP (No Pcp Per Patient) and [facility].  The patient does not have transportation limitations that hinder transportation to clinic appointments.  .Services Needed at time of discharge: Y = Yes, Blank = No PT:   OT:   RN:   Equipment:   Other:     LOS: 0 days   Dionne AnoJulia Keighley Deckman, MD 12/29/2013, 11:43 AM

## 2013-12-29 NOTE — ED Provider Notes (Signed)
CSN: [id number]     Arrival date & time 12/29/13  0114 History   This chart was scribed for Dione Boozeavid Elyas Villamor, MD by Freida Busmaniana Omoyeni, ED Scribe. This patient was seen in room B15C/B15C and the patient's care was started 1:34 AM.    Chief Complaint  Patient presents with  . Chest Pain   The history is provided by the patient. No language interpreter was used.     HPI Comments:  [name] [name] is a 43 y.o. male who presents to the Emergency Department complaining of 2 out of 10 left sided CP that radiates into his left shoulder, with onset about 2200 last night while walking. He describes the pain as a stinging sensation. He also notes chest heaviness which he noticed about 30 minutes ago. He reports associated mild SOB. He denies nausea, vomiting, and diaphoresis. He also denies h/o similar CP. No alleviating factors noted. He is currently a 2 ppd smoker. Denies h/o DM, and HTN. No ASA taken PTA.   Past Medical History  Diagnosis Date  . Allergy    Past Surgical History  Procedure Laterality Date  . Lower extremity angiogram N/A 05/16/2013    Procedure: LOWER EXTREMITY ANGIOGRAM;  Surgeon: Pamella PertJagadeesh R Ganji, MD;  [facility];  Service: Cardiovascular;  Laterality: N/A;   Family History  Problem Relation Age of Onset  . Cancer Father   . Stroke Mother    History  Substance Use Topics  . Smoking status: Current Every Day Smoker  . Smokeless tobacco: Not on file     Comment: uses e cigarette  . Alcohol Use: No    Review of Systems  Constitutional: Negative for diaphoresis.  Respiratory: Positive for shortness of breath.   Cardiovascular: Positive for chest pain.  Gastrointestinal: Negative for nausea and vomiting.  All other systems reviewed and are negative.     Allergies  Codeine  Home Medications   Prior to Admission medications   Medication Sig Start Date End Date Taking? Authorizing Provider  albuterol (PROVENTIL HFA;VENTOLIN HFA) 108 (90 BASE) MCG/ACT inhaler  Inhale 1-2 puffs into the lungs every 6 (six) hours as needed for wheezing or shortness of breath. 04/11/13   Derwood KaplanAnkit Nanavati, MD  aspirin EC 81 MG tablet Take 81 mg by mouth daily.    Historical Provider, MD  atorvastatin (LIPITOR) 20 MG tablet Take 20 mg by mouth daily.    Historical Provider, MD  cilostazol (PLETAL) 100 MG tablet Take 50 mg by mouth 2 (two) times daily.     Historical Provider, MD  naproxen sodium (ANAPROX) 220 MG tablet Take 440 mg by mouth daily as needed (for pain).    Historical Provider, MD  ranitidine (ZANTAC) 75 MG tablet Take 75 mg by mouth daily as needed for heartburn.    Historical Provider, MD  triamcinolone (NASACORT ALLERGY 24HR) 55 MCG/ACT AERO nasal inhaler Place 2 sprays into the nose daily as needed (allergies).    Historical Provider, MD   BP 137/81 mmHg  Pulse 64  Temp(Src) 98.4 F (36.9 C) (Oral)  Resp 19  SpO2 97% Physical Exam  Constitutional: He is oriented to person, place, and time. He appears well-developed and well-nourished.  HENT:  Head: Normocephalic and atraumatic.  Eyes: Conjunctivae are normal. Pupils are equal, round, and reactive to light.  Neck: Normal range of motion. Neck supple. No JVD present.  Cardiovascular: Normal rate, regular rhythm and normal heart sounds.   No murmur heard. Pulmonary/Chest: Effort normal and breath sounds  normal. He has no wheezes. He has no rales. He exhibits no tenderness.  Abdominal: Soft. Bowel sounds are normal. He exhibits no distension and no mass. There is no tenderness.  Musculoskeletal: Normal range of motion. He exhibits no edema.  Lymphadenopathy:    He has no cervical adenopathy.  Neurological: He is alert and oriented to person, place, and time. He has normal reflexes. No cranial nerve deficit.  Skin: Skin is warm and dry. No rash noted.  Psychiatric: He has a normal mood and affect. His behavior is normal. Thought content normal.  Nursing note and vitals reviewed.   ED Course   Procedures   DIAGNOSTIC STUDIES:  Oxygen Saturation is 97% on RA, normal by my interpretation.    COORDINATION OF CARE:  1:38 AM Discussed treatment plan with pt at bedside and pt agreed to plan.  Labs Review Results for orders placed or performed during the hospital encounter of 12/29/13  CBC  Result Value Ref Range   WBC 22.0 (H) 4.0 - 10.5 K/uL   RBC 4.88 4.22 - 5.81 MIL/uL   Hemoglobin 14.2 13.0 - 17.0 g/dL   HCT 08.640.8 57.839.0 - 46.952.0 %   MCV 83.6 78.0 - 100.0 fL   MCH 29.1 26.0 - 34.0 pg   MCHC 34.8 30.0 - 36.0 g/dL   RDW 62.914.1 52.811.5 - 41.315.5 %   Platelets 231 150 - 400 K/uL  Basic metabolic panel  Result Value Ref Range   Sodium 138 137 - 147 mEq/L   Potassium 4.2 3.7 - 5.3 mEq/L   Chloride 100 96 - 112 mEq/L   CO2 22 19 - 32 mEq/L   Glucose, Bld 90 70 - 99 mg/dL   BUN 11 6 - 23 mg/dL   Creatinine, Ser 2.441.13 0.50 - 1.35 mg/dL   Calcium 8.8 8.4 - 01.010.5 mg/dL   GFR calc non Af Amer 78 (L) >90 mL/min   GFR calc Af Amer >90 >90 mL/min   Anion gap 16 (H) 5 - 15  I-stat troponin, ED [facility])  Result Value Ref Range   Troponin i, poc 0.00 0.00 - 0.08 ng/mL   Comment 3           Imaging Review Dg Chest Port 1 View  12/29/2013   CLINICAL DATA:  Left upper chest pain began while walking. Shortness of breath.  EXAM: PORTABLE CHEST - 1 VIEW  COMPARISON:  04/10/2013  FINDINGS: Elevation of right hemidiaphragm with suggestion of atelectasis in the right lung base. No airspace disease in the lungs. No blunting of costophrenic angles. No pneumothorax. Normal heart size and pulmonary vascularity.  IMPRESSION: Atelectasis in the right lung base with elevation of right hemidiaphragm.   Electronically Signed   By: Burman NievesWilliam  Stevens M.D.   On: 12/29/2013 01:48     EKG Interpretation   Date/Time:  Friday December 29 2013 01:21:26 EST Ventricular Rate:  63 PR Interval:  139 QRS Duration: 79 QT Interval:  382 QTC Calculation: 391 R Axis:   46 Text Interpretation:  Sinus rhythm  Probable left atrial enlargement  Anterior infarct, old When compared with ECG of 04/10/2013, No significant  change was found Confirmed by [facility]  MD, Promise Weldin ([id number]) on 12/29/2013  1:26:15 AM      MDM   Final diagnoses:  Chest pain, unspecified chest pain type  Claudication in peripheral vascular disease    Chest pain of uncertain cause. Old records are reviewed and he has had stents placed in his iliac  arteries for atherosclerotic disease. He is therefore at significant risk for coronary artery disease. He continues to smoke. He is given aspirin and nitroglycerin was ordered but he refuses nitroglycerin. He states his in's and he had has been any open the door to the room, the heavy feeling went away. I am concerned about his risk of underlying cardiac disease and will admit him for serial troponins.  I personally performed the services described in this documentation, which was scribed in my presence. The recorded information has been reviewed and is accurate.     Dione Booze, MD 12/29/13 [phone]

## 2013-12-29 NOTE — Discharge Summary (Signed)
Name: [name] [name] MRN: [id number] DOB: 04-13-1970 43 y.o. PCP: No Pcp Per Patient  Date of Admission: 12/29/2013  1:15 AM Date of Discharge: 12/29/2013 Attending Physician: No att. providers found  Discharge Diagnosis: Principal Problem:   Chest pain Active Problems:   Nicotine dependence   Claudication in peripheral vascular disease  Discharge Medications:   Medication List    STOP taking these medications        naproxen sodium 220 MG tablet  Commonly known as:  ANAPROX      TAKE these medications        albuterol 108 (90 BASE) MCG/ACT inhaler  Commonly known as:  PROVENTIL HFA;VENTOLIN HFA  Inhale 1-2 puffs into the lungs every 6 (six) hours as needed for wheezing or shortness of breath.     aspirin EC 81 MG tablet  Take 1 tablet (81 mg total) by mouth daily.     atorvastatin 20 MG tablet  Commonly known as:  LIPITOR  Take 1 tablet (20 mg total) by mouth daily.     cilostazol 100 MG tablet  Commonly known as:  PLETAL  Take 0.5 tablets (50 mg total) by mouth 2 (two) times daily.     esomeprazole 20 MG capsule  Commonly known as:  NEXIUM  Take 1 capsule (20 mg total) by mouth daily as needed (heartburn/acid reflux).     metoprolol tartrate 25 MG tablet  Commonly known as:  LOPRESSOR  Take 1 tablet (25 mg total) by mouth 2 (two) times daily.     ranitidine 150 MG tablet  Commonly known as:  ZANTAC  Take 0.5 tablets (75 mg total) by mouth daily as needed for heartburn.     triamcinolone 55 MCG/ACT Aero nasal inhaler  Commonly known as:  NASACORT ALLERGY 24HR  Place 2 sprays into the nose daily as needed (allergies).        Disposition and follow-up:   [name] [name] was discharged from [facility] in Stable condition.  At the hospital follow up visit please address:  1.  Atypical Chest Pain: please follow; patient's pain was very atypical, plus his EKG and troponins were negative. Follow for recurrence given that he has many  risk factors.  HLD and CAD: Reinitiated medications for this patient with consciousness for cost ($4 list).  2.  Labs / imaging needed at time of follow-up: none  3.  Pending labs/ test needing follow-up: none  Follow-up Appointments:     Follow-up Information    Follow up with [facility]     On 01/08/2014.   Why:  10:30AM   Contact information:   [address] [place] [address] [phone]      Discharge Instructions: Please attend your previously scheduled appointment with Dr. Jacinto Halim on February 01, 2013. He is aware of this hospital visit and your chest pain last night.  Please also fill your prescriptions so that you can take your medications.  Discharge Instructions    Diet - low sodium heart healthy    Complete by:  As directed      Increase activity slowly    Complete by:  As directed            Consultations:  none, spoke with patient's cardiologist  Procedures Performed:  Dg Chest Port 1 View  12/29/2013   CLINICAL DATA:  Left upper chest pain began while walking. Shortness of breath.  EXAM: PORTABLE CHEST - 1 VIEW  COMPARISON:  04/10/2013  FINDINGS: Elevation of right hemidiaphragm with suggestion of atelectasis in the right lung base. No airspace disease in the lungs. No blunting of costophrenic angles. No pneumothorax. Normal heart size and pulmonary vascularity.  IMPRESSION: Atelectasis in the right lung base with elevation of right hemidiaphragm.   Electronically Signed   By: Burman NievesWilliam  Stevens M.D.   On: 12/29/2013 01:48    Admission HPI: [name] [name] is a 43yo man w/ PMHx of peripheral artery disease s/p stents to his left common iliac and left external iliac arteries in April 2015, tobacco abuse (2 packs per day), and HLD who presents to the ED with chest pain. Patient reports around 9 PM he was in an argument with his ex-girlfriend when he started to feel palpitations. He states he "stormed off" and was  walking fast when he developed chest pain in his left shoulder. He states the pain only lasted a second before it went away. He describes the pain as 2/10 in severity, located in a small area on his left shoulder that radiated into his axilla, tingling in sensation, and has been intermittent with only lasting a second and then returning 6-7 minutes later. He also describes chest heaviness at one point, but states it did not last long. He denies chest pain or chest heaviness currently. He states his pain was relieved after EMS gave him nitroglycerin. He reports associated SOB but feels this was due to exerting himself. He denies nausea, vomiting, diaphoresis. He states he has had similar chest pain during emotional distress, but not exertional. He reports the last time he felt this pain was 2 years ago.   In the ED, he was given ASA 325 mg and nitroglycerin. Trops were negative x 1.   Hospital Course by problem list: Principal Problem:   Chest pain Active Problems:   Nicotine dependence   Claudication in peripheral vascular disease  [name] [name] is a 43 yo man with a history of peripheral artery disease (s/p stents to his left common iliac and left external iliac arteries in April 2015), tobacco abuse (2 packs per day), and HLD who [facility]; he clearly has significant cardiac risk factors. However, his episode occurred in the setting of stress and its brevity is less convincing for AMI. He had 2 EKGs that were not concerning for acute changes and 2 negative troponins; he refused the 3rd blood draw. His chest pain completely resolved. He is followed by Dr. Jacinto HalimGanji, who reviewed his history on this visit, is comfortable with his discharge with his previously scheduled follow up in early January.  Atypical Chest Pain: Chest pain brought on by emotional stress and physical exertion. Described as 2/10 in severity, only lasting "a second" and intermittent, located in left  shoulder radiating to axilla, and associated with palpitations and SOB. Patient does have significant cardiac risk factors including heavy smoking, peripheral artery disease, and hyperlipidemia. However, his presentation is very atypical and not convincing for ACS. He follows with Dr. Jacinto HalimGanji. Of note, patient had a nuclear stress test on 04/07/13 that showed no evidence of ischemia and an EF 54%, no Sepulveda motion abnormalities. EKGs unchanged from previous. Troponins negative x2. CXR shows normal heart size and pulmonary vascularity. TIMI risk score 0-1 (1 for aspirin use, but has not been taking this medication). His chest pain is most likely due to anxiety. UDS negative.  Peripheral Vascular Disease: History of claudication and s/p stents to left common iliac and  left external iliac arteries. Patient has Pletal 50 mg BID and Lipitor 20 mg daily listed on home meds, but patient states he has not taken any medications since August. 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 He is currently unemployed. He denies calf pain at rest, but reports some pain if walking for long distances. ReinitiatedPletal and Lovastatin 20 mg daily (on the $4 list).  HLD: Last lipid profile on 03/28/13 shows Chol 210, Trigly 203, HDL 32, and LDL 137. He is prescribed lovastatin 20 mg daily, but states he has not taken this medication for several months. Given his hx of PVD, patient should be on statin (lovastatin reinitiated).  Discharge Vitals:   BP 108/58 mmHg  Pulse 56  Temp(Src) 97.7 F (36.5 C) (Oral)  Resp 13  Ht 5\' 9"  (1.753 m)  Wt 171 lb (77.565 kg)  BMI 25.24 kg/m2  SpO2 99%  Discharge Labs:  Results for orders placed or performed during the hospital encounter of 12/29/13 (from the past 24 hour(s))  CBC     Status: Abnormal   Collection Time: 12/29/13  1:20 AM  Result Value Ref Range   WBC 22.0 (H) 4.0 - 10.5 K/uL    RBC 4.88 4.22 - 5.81 MIL/uL   Hemoglobin 14.2 13.0 - 17.0 g/dL   HCT 16.140.8 09.639.0 - 04.552.0 %   MCV 83.6 78.0 - 100.0 fL   MCH 29.1 26.0 - 34.0 pg   MCHC 34.8 30.0 - 36.0 g/dL   RDW 40.914.1 81.111.5 - 91.415.5 %   Platelets 231 150 - 400 K/uL  Basic metabolic panel     Status: Abnormal   Collection Time: 12/29/13  1:20 AM  Result Value Ref Range   Sodium 138 137 - 147 mEq/L   Potassium 4.2 3.7 - 5.3 mEq/L   Chloride 100 96 - 112 mEq/L   CO2 22 19 - 32 mEq/L   Glucose, Bld 90 70 - 99 mg/dL   BUN 11 6 - 23 mg/dL   Creatinine, Ser 7.821.13 0.50 - 1.35 mg/dL   Calcium 8.8 8.4 - 95.610.5 mg/dL   GFR calc non Af Amer 78 (L) >90 mL/min   GFR calc Af Amer >90 >90 mL/min   Anion gap 16 (H) 5 - 15  I-stat troponin, ED ([facility])     Status: None   Collection Time: 12/29/13  1:47 AM  Result Value Ref Range   Troponin i, poc 0.00 0.00 - 0.08 ng/mL   Comment 3          Troponin I-serum (0, 3, 6 hours)     Status: None   Collection Time: 12/29/13  6:25 AM  Result Value Ref Range   Troponin I <0.30 <0.30 ng/mL  Troponin I-serum (0, 3, 6 hours)     Status: None   Collection Time: 12/29/13  7:31 AM  Result Value Ref Range   Troponin I <0.30 <0.30 ng/mL  Urine rapid drug screen (hosp performed)     Status: None   Collection Time: 12/29/13 10:37 AM  Result Value Ref Range   Opiates NONE DETECTED NONE DETECTED   Cocaine NONE DETECTED NONE DETECTED   Benzodiazepines NONE DETECTED NONE DETECTED   Amphetamines NONE DETECTED NONE DETECTED   Tetrahydrocannabinol NONE DETECTED NONE DETECTED   Barbiturates NONE DETECTED NONE DETECTED    Signed: Dionne AnoJulia Arda Daggs, MD 12/29/2013, 4:31 PM  Services Ordered on Discharge: none Equipment Ordered on Discharge: none

## 2013-12-29 NOTE — ED Notes (Signed)
Patient got into a argument with significant other and began walking down the road to [place], [place]. While walking patient began having left upper chest pain he described as a 2/10 stinging pain that radiated to his left arm. Patient denies N/v, but states he is SOB room air sats are 100%. Patient refused to take ASA, but took 1 SL nitro and pain decreased from a 2 to a 1. Patient has had 3 stents placed previously.

## 2013-12-29 NOTE — H&P (Signed)
Date: 12/29/2013               Patient Name:  [name] [name] MRN: [id number]  DOB: 09/27/1970 Age / Sex: 43 y.o., male   PCP: No Pcp Per Patient         Medical Service: Internal Medicine Teaching Service         Attending Physician: Dr. Burns Spain, MD    First Contact: Dr. Eleonore Chiquito Pager: [phone]  Second Contact: Dr. Boykin Peek Pager: [phone]       After Hours (After 5p/  First Contact Pager: [phone]  weekends / holidays): Second Contact Pager: [phone]   Chief Complaint: Chest pain  History of Present Illness: [name] [name] is a 43yo man w/ PMHx of peripheral artery disease s/p stents to his left common iliac and left external iliac arteries in April 2015, tobacco abuse (2 packs per day), and HLD who presents to the ED with chest pain.  Patient reports around 9 PM he was in an argument with his ex-girlfriend when he started to feel palpitations. He states he "stormed off" and was walking fast when he developed chest pain in his left shoulder. He states the pain only lasted a second before it went away. He describes the pain as 2/10 in severity, located in a small area on his left shoulder that radiated into his axilla, tingling in sensation, and has been intermittent with only lasting a second and then returning 6-7 minutes later. He also describes chest heaviness at one point, but states it did not last long. He denies chest pain or chest heaviness currently. He states his pain was relieved after EMS gave him nitroglycerin. He reports associated SOB but feels this was due to exerting himself. He denies nausea, vomiting, diaphoresis. He states he has had similar chest pain during emotional distress, but not exertional. He reports the last time he felt this pain was 2 years ago.   In the ED, he was given ASA 325 mg and nitroglycerin. Trops were negative x 1.   Meds: Current Facility-Administered Medications  Medication Dose Route Frequency Provider Last Rate Last Dose  .  nitroGLYCERIN (NITROSTAT) SL tablet 0.4 mg  0.4 mg Sublingual Q5 min PRN Dione Booze, MD   0.4 mg at 12/29/13 0210   Current Outpatient Prescriptions  Medication Sig Dispense Refill  . albuterol (PROVENTIL HFA;VENTOLIN HFA) 108 (90 BASE) MCG/ACT inhaler Inhale 1-2 puffs into the lungs every 6 (six) hours as needed for wheezing or shortness of breath. 1 Inhaler 0  . esomeprazole (NEXIUM) 20 MG capsule Take 20 mg by mouth daily as needed (heartburn/acid reflux).    . naproxen sodium (ANAPROX) 220 MG tablet Take 440 mg by mouth daily as needed (for pain).    . ranitidine (ZANTAC) 75 MG tablet Take 75 mg by mouth daily as needed for heartburn.    . triamcinolone (NASACORT ALLERGY 24HR) 55 MCG/ACT AERO nasal inhaler Place 2 sprays into the nose daily as needed (allergies).    [name] aspirin EC 81 MG tablet Take 81 mg by mouth daily.    [name] atorvastatin (LIPITOR) 20 MG tablet Take 20 mg by mouth daily.    . cilostazol (PLETAL) 100 MG tablet Take 50 mg by mouth 2 (two) times daily.       Allergies: Allergies as of 12/29/2013 - Review Complete 12/29/2013  Allergen Reaction Noted  . Codeine  07/14/2012   Past Medical History  Diagnosis Date  . Allergy  Past Surgical History  Procedure Laterality Date  . Lower extremity angiogram N/A 05/16/2013    Procedure: LOWER EXTREMITY ANGIOGRAM;  Surgeon: Pamella PertJagadeesh R Ganji, MD;  [facility];  Service: Cardiovascular;  Laterality: N/A;   Family History  Problem Relation Age of Onset  . Cancer Father   . Stroke Mother    History   Social History  . Marital Status: Single    Spouse Name: N/A    Number of Children: N/A  . Years of Education: N/A   Occupational History  . Not on file.   Social History Main Topics  . Smoking status: Current Every Day Smoker  . Smokeless tobacco: Not on file     Comment: uses e cigarette  . Alcohol Use: No  . Drug Use: No  . Sexual Activity: Yes   Other Topics Concern  . Not on file   Social History  Narrative    Review of Systems: General: Denies fever, chills, night sweats, changes in weight, changes in appetite HEENT: Denies headaches, ear pain, changes in vision, rhinorrhea, sore throat CV: See HPI Pulm: Denies cough, wheezing GI: Denies abdominal pain, diarrhea, constipation, melena, hematochezia GU: Denies dysuria, hematuria, frequency Msk: Denies muscle cramps, joint pains Neuro: Denies weakness, numbness Skin: Denies rashes, bruising  Physical Exam: Blood pressure 107/71, pulse 65, temperature 98.4 F (36.9 C), temperature source Oral, resp. rate 11, SpO2 98 %. General: alert, sitting up in bed, NAD HEENT: [place]/AT, EOMI, sclera anicteric, mucus membranes moist CV: RRR, normal S1/S2, no m/g/r, no chest Guthrie tenderness Pulm: CTA bilaterally, breaths non-labored Abd: BS+, soft, non-tender Ext: warm, no edema, moves all Neuro: alert and oriented x 3, appears frustrated and angry, CNs II-XII intact  Lab results: Basic Metabolic Panel:  Recent Labs  40/98/1110/12/04 0120  NA 138  K 4.2  CL 100  CO2 22  GLUCOSE 90  BUN 11  CREATININE 1.13  CALCIUM 8.8   Liver Function Tests: No results for input(s): AST, ALT, ALKPHOS, BILITOT, PROT, ALBUMIN in the last 72 hours. No results for input(s): LIPASE, AMYLASE in the last 72 hours. No results for input(s): AMMONIA in the last 72 hours. CBC:  Recent Labs  12/29/13 0120  WBC 22.0*  HGB 14.2  HCT 40.8  MCV 83.6  PLT 231   Cardiac Enzymes: No results for input(s): CKTOTAL, CKMB, CKMBINDEX, TROPONINI in the last 72 hours. BNP: No results for input(s): PROBNP in the last 72 hours. D-Dimer: No results for input(s): DDIMER in the last 72 hours. CBG: No results for input(s): GLUCAP in the last 72 hours. Hemoglobin A1C: No results for input(s): HGBA1C in the last 72 hours. Fasting Lipid Panel: No results for input(s): CHOL, HDL, LDLCALC, TRIG, CHOLHDL, LDLDIRECT in the last 72 hours. Thyroid Function Tests: No results for  input(s): TSH, T4TOTAL, FREET4, T3FREE, THYROIDAB in the last 72 hours. Anemia Panel: No results for input(s): VITAMINB12, FOLATE, FERRITIN, TIBC, IRON, RETICCTPCT in the last 72 hours. Coagulation: No results for input(s): LABPROT, INR in the last 72 hours. Urine Drug Screen: Drugs of Abuse  No results found for: LABOPIA, COCAINSCRNUR, LABBENZ, AMPHETMU, THCU, LABBARB  Alcohol Level: No results for input(s): ETH in the last 72 hours. Urinalysis: No results for input(s): COLORURINE, LABSPEC, PHURINE, GLUCOSEU, HGBUR, BILIRUBINUR, KETONESUR, PROTEINUR, UROBILINOGEN, NITRITE, LEUKOCYTESUR in the last 72 hours.  Invalid input(s): APPERANCEUR   Imaging results:  Dg Chest Port 1 View  12/29/2013   CLINICAL DATA:  Left upper chest pain began while  walking. Shortness of breath.  EXAM: PORTABLE CHEST - 1 VIEW  COMPARISON:  04/10/2013  FINDINGS: Elevation of right hemidiaphragm with suggestion of atelectasis in the right lung base. No airspace disease in the lungs. No blunting of costophrenic angles. No pneumothorax. Normal heart size and pulmonary vascularity.  IMPRESSION: Atelectasis in the right lung base with elevation of right hemidiaphragm.   Electronically Signed   By: Burman NievesWilliam  Stevens M.D.   On: 12/29/2013 01:48    Other results: EKG: sinus rhythm, old anterior infarct, no significant changes from prior  Assessment & Plan by Problem: Principal Problem:   Chest pain Active Problems:   Nicotine dependence   Claudication in peripheral vascular disease [name] [name] is a 43yo man w/ PMHx of peripheral artery disease s/p stents to his left common iliac and left external iliac arteries in April 2015, tobacco abuse (2 packs per day), and HLD who presents with atypical chest pain.   Atypical Chest Pain: Chest pain brought on by emotional stress and physical exertion. Described as 2/10 in severity, only lasting "a second" and intermittent, located in left shoulder radiating to axilla, and  associated with palpitations and SOB. Patient does have significant cardiac risk factors including heavy smoking, peripheral artery disease, and hyperlipidemia. However, his presentation is very atypical and not convincing for ACS. He follows with Dr. Jacinto HalimGanji. Of note, patient had a nuclear stress test on 04/07/13 that showed no evidence of ischemia and an EF 54%, no Hyden motion abnormalities. In the ED, troponin negative x 1. EKG unchanged from prior. CXR shows normal heart size and pulmonary vascularity. TIMI risk score 0-1 (1 for aspirin use, but has not been taking this medication). His chest pain is most likely due to anxiety. - Call Dr. Jacinto HalimGanji in AM - Trend troponins - Repeat EKG in AM - Nitro 0.4 mg as needed - ASA 81 mg - Lipitor 20 mg daily - Metoprolol 25 mg BID - Cardiac monitoring  Peripheral Vascular Disease: Hx of claudication and s/p stents to left common iliac and left external iliac arteries. Patient has Pletal 50 mg BID and Lipitor 20 mg daily listed on home meds, but patient states he has not taken any medications since August. 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 He is currently unemployed. He denies calf pain at rest, but reports some pain if walking for long distances.  - Start Pletal 50 mg BID - Start Lipitor 20 mg daily  HLD: Last lipid profile on 03/28/13 shows Chol 210, Trigly 203, HDL 32, and LDL 137. He is prescribed Lipitor 20 mg daily, but states he has not taken this medication for several months. Given his hx of PVD, patient should be on statin.  - Start Lipitor 20 mg daily  Tobacco abuse: Patient currently smokes 2 packs per day. He notes increased smoking over past few days due to stress.  - Encourage smoking cessation   Diet: Heart healthy DVT/PE PPx: Lovenox SQ Dispo: Disposition is deferred at this time, awaiting improvement of current medical problems.  Anticipated discharge in approximately 1 day(s).   The patient does not have a current PCP (No Pcp Per Patient) and [facility] after discharge.  The patient does not have transportation limitations that hinder transportation to clinic appointments.  Signed: Rich Numberarly Rivet, MD 12/29/2013, 4:02 AM

## 2013-12-29 NOTE — Progress Notes (Signed)
Spoke with Dr.Ghanji on the phone this morning. States pt can be discharged with current follow up appointment.Internal medicine notified. Cecille Rubinhompson,Feliciano Wynter V, RN

## 2013-12-29 NOTE — Progress Notes (Signed)
UR completed 

## 2014-01-08 ENCOUNTER — Inpatient Hospital Stay: Payer: BC Managed Care – PPO | Admitting: Internal Medicine

## 2014-01-19 ENCOUNTER — Emergency Department (HOSPITAL_COMMUNITY)
Admission: EM | Admit: 2014-01-19 | Discharge: 2014-01-19 | Disposition: A | Discharge status: Home / Self Care | Payer: BC Managed Care – PPO | Attending: Emergency Medicine | Admitting: Emergency Medicine

## 2014-01-19 ENCOUNTER — Emergency Department (HOSPITAL_COMMUNITY): Payer: BC Managed Care – PPO

## 2014-01-19 ENCOUNTER — Encounter (HOSPITAL_COMMUNITY): Payer: Self-pay | Admitting: *Deleted

## 2014-01-19 DIAGNOSIS — R52 Pain, unspecified: Secondary | ICD-10-CM

## 2014-01-19 DIAGNOSIS — Z9861 Coronary angioplasty status: Secondary | ICD-10-CM | POA: Insufficient documentation

## 2014-01-19 DIAGNOSIS — Z79899 Other long term (current) drug therapy: Secondary | ICD-10-CM | POA: Insufficient documentation

## 2014-01-19 DIAGNOSIS — E780 Pure hypercholesterolemia: Secondary | ICD-10-CM | POA: Insufficient documentation

## 2014-01-19 DIAGNOSIS — Y9389 Activity, other specified: Secondary | ICD-10-CM | POA: Insufficient documentation

## 2014-01-19 DIAGNOSIS — Z7982 Long term (current) use of aspirin: Secondary | ICD-10-CM | POA: Insufficient documentation

## 2014-01-19 DIAGNOSIS — Y9289 Other specified places as the place of occurrence of the external cause: Secondary | ICD-10-CM | POA: Insufficient documentation

## 2014-01-19 DIAGNOSIS — Z7951 Long term (current) use of inhaled steroids: Secondary | ICD-10-CM | POA: Insufficient documentation

## 2014-01-19 DIAGNOSIS — Y998 Other external cause status: Secondary | ICD-10-CM | POA: Insufficient documentation

## 2014-01-19 DIAGNOSIS — S92401A Displaced unspecified fracture of right great toe, initial encounter for closed fracture: Secondary | ICD-10-CM | POA: Insufficient documentation

## 2014-01-19 DIAGNOSIS — W2209XA Striking against other stationary object, initial encounter: Secondary | ICD-10-CM | POA: Insufficient documentation

## 2014-01-19 DIAGNOSIS — Z720 Tobacco use: Secondary | ICD-10-CM | POA: Insufficient documentation

## 2014-01-19 DIAGNOSIS — Z8679 Personal history of other diseases of the circulatory system: Secondary | ICD-10-CM | POA: Insufficient documentation

## 2014-01-19 DIAGNOSIS — Z79891 Long term (current) use of opiate analgesic: Secondary | ICD-10-CM | POA: Insufficient documentation

## 2014-01-19 HISTORY — DX: Pure hypercholesterolemia, unspecified: E78.00

## 2014-01-19 MED ORDER — OXYCODONE-ACETAMINOPHEN 5-325 MG PO TABS: 1.0000 | ORAL_TABLET | ORAL | Status: DC | PRN

## 2014-01-19 MED ORDER — LIDOCAINE HCL (PF) 1 % IJ SOLN
5.0000 mL | Freq: Once | INTRAMUSCULAR | Status: AC
  Administered 2014-01-19: 5 mL via INTRADERMAL
  Filled 2014-01-19: qty 5

## 2014-01-19 MED ORDER — OXYCODONE-ACETAMINOPHEN 5-325 MG PO TABS
2.0000 | ORAL_TABLET | Freq: Once | ORAL | Status: AC
  Administered 2014-01-19: 2 via ORAL
  Filled 2014-01-19: qty 2

## 2014-01-19 NOTE — Discharge Instructions (Signed)
1. Medications: percocet, usual home medications 2. Treatment: rest, drink plenty of fluids, not walking on your foot until evaluated by orthopedics 3. Follow Up: Please followup with Dr. Lajoyce Corners or an orthopedic of your choice for further evaluation of your toe, if you do not have a primary care doctor use the resource guide provided to find one; Please return to the ER for  worsening pain, significant swelling, high fevers or other concerning symptoms   Toe Fracture Your caregiver has diagnosed you as having a fractured toe. A toe fracture is a break in the bone of a toe. "Buddy taping" is a way of splinting your broken toe, by taping the broken toe to the toe next to it. This "buddy taping" will keep the injured toe from moving beyond normal range of motion. Buddy taping also helps the toe heal in a more normal alignment. It may take 6 to 8 weeks for the toe injury to heal. HOME CARE INSTRUCTIONS   Leave your toes taped together for as long as directed by your caregiver or until you see a doctor for a follow-up examination. You can change the tape after bathing. Always use a small piece of gauze or cotton between the toes when taping them together. This will help the skin stay dry and prevent infection.  Apply ice to the injury for 15-20 minutes each hour while awake for the first 2 days. Put the ice in a plastic bag and place a towel between the bag of ice and your skin.  After the first 2 days, apply heat to the injured area. Use heat for the next 2 to 3 days. Place a heating pad on the foot or soak the foot in warm water as directed by your caregiver.  Keep your foot elevated as much as possible to lessen swelling.  Wear sturdy, supportive shoes. The shoes should not pinch the toes or fit tightly against the toes.  Your caregiver may prescribe a rigid shoe if your foot is very swollen.  Your may be given crutches if the pain is too great and it hurts too much to walk.  Only take  over-the-counter or prescription medicines for pain, discomfort, or fever as directed by your caregiver.  If your caregiver has given you a follow-up appointment, it is very important to keep that appointment. Not keeping the appointment could result in a chronic or permanent injury, pain, and disability. If there is any problem keeping the appointment, you must call back to this facility for assistance. SEEK MEDICAL CARE IF:   You have increased pain or swelling, not relieved with medications.  The pain does not get better after 1 week.  Your injured toe is cold when the others are warm. SEEK IMMEDIATE MEDICAL CARE IF:   The toe becomes cold, numb, or white.  The toe becomes hot (inflamed) and red. Document Released: 01/03/2000 Document Revised: 03/30/2011 Document Reviewed: 08/22/2007 [facility] Patient Information 2015 [place], [place]. This information is not intended to replace advice given to you by your health care provider. Make sure you discuss any questions you have with your health care provider.

## 2014-01-19 NOTE — ED Notes (Signed)
Patient presents stating he ran into a basketball goal with his right foot injury his great toe.  Toe swollen

## 2014-01-19 NOTE — ED Provider Notes (Signed)
CSN: [id number]     Arrival date & time 01/19/14  2003 History   None    Chief Complaint  Patient presents with  . Toe Injury    The history is provided by the patient and medical records. No language interpreter was used.   This chart was scribed for non-physician practitioner Dierdre Forth, PA-C,  working with No att. providers found, by Andrew Au, ED Scribe. This patient was seen in room TR11C/TR11C and the patient's care was started at 10:59 PM.  [name] [name] is a 44 y.o. male who presents to the Emergency Department complaining of a right great toe injury that occurred today. Pt states he kicked a  basketball goal. She reports the pain to his right great toe and medial right foot but no pain in the ankle, knee or hip. Patient reports he's been unable to ambulate since the incident. He has been using crutches. Pt takes cholesterol and blood thinning medication he is unable to recall.  ( Record review lists Pletal as a vasodilator but no anticoagulants listed). Pt reports 3 stents in left leg due to peripheral vascular disease and claudication. Pt denies drugs and alcohol.    Past Medical History  Diagnosis Date  . Allergy   . Hypercholesteremia    Past Surgical History  Procedure Laterality Date  . Lower extremity angiogram N/A 05/16/2013    Procedure: LOWER EXTREMITY ANGIOGRAM;  Surgeon: Pamella Pert, MD;  Location: [facility] CATH LAB;  Service: Cardiovascular;  Laterality: N/A;  . Heart stents     Family History  Problem Relation Age of Onset  . Cancer Father   . Stroke Mother    History  Substance Use Topics  . Smoking status: Current Every Day Smoker  . Smokeless tobacco: Never Used     Comment: uses e cigarette  . Alcohol Use: No    Review of Systems  Constitutional: Negative for fever and chills.  Gastrointestinal: Negative for nausea and vomiting.  Musculoskeletal: Positive for myalgias, joint swelling and arthralgias. Negative for back pain, neck pain and neck  stiffness.  Skin: Negative for wound.  Neurological: Negative for numbness.  Hematological: Does not bruise/bleed easily.  Psychiatric/Behavioral: The patient is not nervous/anxious.   All other systems reviewed and are negative.  Allergies  Codeine  Home Medications   Prior to Admission medications   Medication Sig Start Date End Date Taking? Authorizing Provider  albuterol (PROVENTIL HFA;VENTOLIN HFA) 108 (90 BASE) MCG/ACT inhaler Inhale 1-2 puffs into the lungs every 6 (six) hours as needed for wheezing or shortness of breath. 12/29/13   Dionne Ano, MD  aspirin EC 81 MG tablet Take 1 tablet (81 mg total) by mouth daily. 12/29/13   Dionne Ano, MD  atorvastatin (LIPITOR) 20 MG tablet Take 1 tablet (20 mg total) by mouth daily. 12/29/13   Dionne Ano, MD  cilostazol (PLETAL) 100 MG tablet Take 0.5 tablets (50 mg total) by mouth 2 (two) times daily. 12/29/13   Dionne Ano, MD  esomeprazole (NEXIUM) 20 MG capsule Take 1 capsule (20 mg total) by mouth daily as needed (heartburn/acid reflux). 12/29/13   Dionne Ano, MD  metoprolol tartrate (LOPRESSOR) 25 MG tablet Take 1 tablet (25 mg total) by mouth 2 (two) times daily. 12/29/13   Dionne Ano, MD  oxyCODONE-acetaminophen (PERCOCET) 5-325 MG per tablet Take 1-2 tablets by mouth every 4 (four) hours as needed. 01/19/14   Shakyia Bosso, PA-C  ranitidine (ZANTAC) 150 MG tablet Take 0.5 tablets (75 mg total)  by mouth daily as needed for heartburn. 12/29/13   Dionne Ano, MD  triamcinolone (NASACORT ALLERGY 24HR) 55 MCG/ACT AERO nasal inhaler Place 2 sprays into the nose daily as needed (allergies). 12/29/13   Dionne Ano, MD   BP 140/87 mmHg  Pulse 82  Temp(Src) 97.7 F (36.5 C) (Oral)  Resp 16  Ht  (1.753 m)  Wt 178 lb (80.74 kg)  BMI 26.27 kg/m2  SpO2 96% Physical Exam  Constitutional: He appears well-developed and well-nourished. No distress.  HENT:  Head: Normocephalic and atraumatic.  Eyes: Conjunctivae  are normal.  Neck: Normal range of motion.  Cardiovascular: Normal rate, regular rhythm, normal heart sounds and intact distal pulses.   No murmur heard. Capillary refill < 3 sec  Pulmonary/Chest: Effort normal and breath sounds normal.  Musculoskeletal: He exhibits tenderness. He exhibits no edema.  ROM: Full range of motion of the right knee and ankle, no range of motion of right great toe due to pain, or range of motion of all other toes on the right foot Deformity of the right great toe without tenting of the skin  Neurological: He is alert. Coordination normal.  Sensation intact to dull and sharp Strength 5/5 versus flexion and plantar flexion, no resisted flexion and extension of the right great toe due to pain  Skin: Skin is warm and dry. He is not diaphoretic. No erythema.  No tenting of the skin No lacerations  Psychiatric: He has a normal mood and affect.  Nursing note and vitals reviewed.   ED Course  NERVE BLOCK Date/Time: 01/19/2014 9:39 PM Performed by: Dierdre Forth Authorized by: Dierdre Forth Consent: Verbal consent obtained. Risks and benefits: risks, benefits and alternatives were discussed Consent given by: patient Patient understanding: patient states understanding of the procedure being performed Patient consent: the patient's understanding of the procedure matches consent given Procedure consent: procedure consent matches procedure scheduled Relevant documents: relevant documents present and verified Site marked: the operative site was marked Imaging studies: imaging studies available Required items: required blood products, implants, devices, and special equipment available Patient identity confirmed: verbally with patient and arm band Time out: Immediately prior to procedure a "time out" was called to verify the correct patient, procedure, equipment, support staff and site/side marked as required. Indications: pain relief and fracture Body  area: lower extremity Nerve: digital Laterality: right Patient sedated: no Preparation: Patient was prepped and draped in the usual sterile fashion. Patient position: supine Needle gauge: 25 G Location technique: anatomical landmarks Local anesthetic: lidocaine 1% without epinephrine Anesthetic total: 5 ml Outcome: pain improved Patient tolerance: Patient tolerated the procedure well with no immediate complications  Reduction of fracture Date/Time: 01/19/2014 11:00 PM Performed by: Dierdre Forth Authorized by: Dierdre Forth Consent: Verbal consent obtained. Risks and benefits: risks, benefits and alternatives were discussed Consent given by: patient Patient understanding: patient states understanding of the procedure being performed Patient consent: the patient's understanding of the procedure matches consent given Procedure consent: procedure consent matches procedure scheduled Relevant documents: relevant documents present and verified Site marked: the operative site was marked Imaging studies: imaging studies available Required items: required blood products, implants, devices, and special equipment available Patient identity confirmed: verbally with patient and arm band Time out: Immediately prior to procedure a "time out" was called to verify the correct patient, procedure, equipment, support staff and site/side marked as required. Preparation: Patient was prepped and draped in the usual sterile fashion. Local anesthesia used: yes Anesthesia: digital block Local anesthetic: lidocaine 1%  without epinephrine Anesthetic total: 5 ml Patient sedated: no Patient tolerance: Patient tolerated the procedure well with no immediate complications Comments: Projection of fracture of right great toe with improvement in alignment and decrease in visible and palpable deformity   (including critical care time) DIAGNOSTIC STUDIES: Oxygen Saturation is 96% on RA, normal by my  interpretation.    COORDINATION OF CARE: 10:59 PM- Pt advised of plan for treatment and pt agrees.  Labs Review Labs Reviewed - No data to display  Imaging Review Dg Foot Complete Right  01/19/2014   CLINICAL DATA:  Stubbed toe on basketball goal this evening, with right great toe pain. Initial encounter.  EXAM: RIGHT FOOT COMPLETE - 3+ VIEW  COMPARISON:  None.  FINDINGS: There is a comminuted fracture involving the first proximal phalanx, with extension to the first interphalangeal joint. In addition, there is suggestion of a minimally displaced fracture through the distal tuft of the first digit, and likely also involving the proximal portions of the first distal phalanx.  Associated soft tissue swelling is noted. No additional fractures are seen. The subtalar joint is grossly unremarkable.  IMPRESSION: Comminuted fracture involving the first proximal phalanx, with extension to the first interphalangeal joint. In addition, there is suggestion of a minimally displaced fracture through the distal tuft of the first digit, likely also involving the proximal portions of the first distal phalanx.   Electronically Signed   By: Roanna Raider M.D.   On: 01/19/2014 21:06   Dg Toe Great Right  01/19/2014   CLINICAL DATA:  Great toe injury.  Great toe pain.  Trauma.  EXAM: RIGHT GREAT TOE  COMPARISON:  None.  FINDINGS: Comminuted mildly displaced fractures of the great toe proximal phalanx. Proximal articular surface appears normal and there is no intra-articular extension. There is probably a small amount of intra-articular extension into the distal articular surface however no step-off deformity is present.  IMPRESSION: Comminuted proximal phalanx great toe fracture, with mild displacement of a few fragments. Probable intra-articular extension into the IP joint.   Electronically Signed   By: Andreas Newport M.D.   On: 01/19/2014 20:56     EKG Interpretation None      MDM   Final diagnoses:  Fractured  great toe, right, closed, initial encounter   [name] [name] presents with right great toe pain after kicking a metal pole.  Patient X-Ray with Comminuted proximal phalanx great toe fracture, with mild displacement of a few fragments. Pain managed in ED. Digital block performed and reduction of great toe performed with improvement in visible and palpable deformity.  Pt advised to follow up with orthopedics for further evaluation and treatment.  Patient given splint, post op she and crutches while in ED, conservative therapy recommended and discussed. Patient is to be non-weight bearing until he sees orthopedics. Patient will be dc home & is agreeable with above plan. I have also discussed reasons to return immediately to the ER.  Patient expresses understanding and agrees with plan.  I have personally reviewed patient's vitals, nursing note and any pertinent labs or imaging.  I performed an focused physical exam; undressed when appropriate .    It has been determined that no acute conditions requiring further emergency intervention are present at this time. The patient/guardian have been advised of the diagnosis and plan. I reviewed any labs and imaging including any potential incidental findings. We have discussed signs and symptoms that warrant return to the ED and they are listed in the discharge  instructions.    Vital signs are stable at discharge. Patient with tachycardia on arrival to the emergency department however no tachycardia on physical exam.   BP 140/87 mmHg  Pulse 82  Temp(Src) 97.7 F (36.5 C) (Oral)  Resp 16  Ht  (1.753 m)  Wt 178 lb (80.74 kg)  BMI 26.27 kg/m2  SpO2 96%  I personally performed the services described in this documentation, which was scribed in my presence. The recorded information has been reviewed and is accurate.    Dahlia Client Emaree Chiu, PA-C 01/19/14 [id number]  Flint Melter, MD 01/20/14 [phone]

## 2014-07-16 ENCOUNTER — Emergency Department (HOSPITAL_COMMUNITY)
Admission: EM | Admit: 2014-07-16 | Discharge: 2014-07-16 | Discharge status: Against Medical Advice | Payer: Self-pay | Attending: Emergency Medicine | Admitting: Emergency Medicine

## 2014-07-16 ENCOUNTER — Encounter (HOSPITAL_COMMUNITY): Payer: Self-pay | Admitting: Emergency Medicine

## 2014-07-16 DIAGNOSIS — S0081XA Abrasion of other part of head, initial encounter: Secondary | ICD-10-CM | POA: Insufficient documentation

## 2014-07-16 DIAGNOSIS — Z79899 Other long term (current) drug therapy: Secondary | ICD-10-CM | POA: Insufficient documentation

## 2014-07-16 DIAGNOSIS — Z7982 Long term (current) use of aspirin: Secondary | ICD-10-CM | POA: Insufficient documentation

## 2014-07-16 DIAGNOSIS — Y9289 Other specified places as the place of occurrence of the external cause: Secondary | ICD-10-CM | POA: Insufficient documentation

## 2014-07-16 DIAGNOSIS — E780 Pure hypercholesterolemia: Secondary | ICD-10-CM | POA: Insufficient documentation

## 2014-07-16 DIAGNOSIS — Y998 Other external cause status: Secondary | ICD-10-CM | POA: Insufficient documentation

## 2014-07-16 DIAGNOSIS — L089 Local infection of the skin and subcutaneous tissue, unspecified: Secondary | ICD-10-CM | POA: Insufficient documentation

## 2014-07-16 DIAGNOSIS — Z7951 Long term (current) use of inhaled steroids: Secondary | ICD-10-CM | POA: Insufficient documentation

## 2014-07-16 DIAGNOSIS — Y9389 Activity, other specified: Secondary | ICD-10-CM | POA: Insufficient documentation

## 2014-07-16 DIAGNOSIS — S1081XA Abrasion of other specified part of neck, initial encounter: Secondary | ICD-10-CM

## 2014-07-16 DIAGNOSIS — Z720 Tobacco use: Secondary | ICD-10-CM | POA: Insufficient documentation

## 2014-07-16 DIAGNOSIS — G44309 Post-traumatic headache, unspecified, not intractable: Secondary | ICD-10-CM | POA: Insufficient documentation

## 2014-07-16 MED ORDER — HYDROCODONE-ACETAMINOPHEN 5-325 MG PO TABS
2.0000 | ORAL_TABLET | Freq: Once | ORAL | Status: AC
  Administered 2014-07-16: 2 via ORAL
  Filled 2014-07-16: qty 2
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
## 2014-07-16 NOTE — ED Notes (Addendum)
Pt is leaving AMA and refusing vital signs

## 2014-07-16 NOTE — ED Notes (Signed)
Pt is stating he wants to leave and go home. PA Hannah notified and at bedside
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
## 2014-07-16 NOTE — Discharge Instructions (Signed)
1. Medications: usual home medications 2. Treatment: rest, drink plenty of fluids, tylenol or ibuprofen for headache 3. Follow Up: Please followup with your primary doctor in 2-3 days for discussion of your diagnoses and further evaluation after today's visit; if you do not have a primary care doctor use the resource guide provided to find one; Please return to the ER for worsening symptoms    Abrasion An abrasion is a cut or scrape of the skin. Abrasions do not extend through all layers of the skin and most heal within 10 days. It is important to care for your abrasion properly to prevent infection. CAUSES  Most abrasions are caused by falling on, or gliding across, the ground or other surface. When your skin rubs on something, the outer and inner layer of skin rubs off, causing an abrasion. DIAGNOSIS  Your caregiver will be able to diagnose an abrasion during a physical exam.  TREATMENT  Your treatment depends on how large and deep the abrasion is. Generally, your abrasion will be cleaned with water and a mild soap to remove any dirt or debris. An antibiotic ointment may be put over the abrasion to prevent an infection. A bandage (dressing) may be wrapped around the abrasion to keep it from getting dirty.  You may need a tetanus shot if:  You cannot remember when you had your last tetanus shot.  You have never had a tetanus shot.  The injury broke your skin. If you get a tetanus shot, your arm may swell, get red, and feel warm to the touch. This is common and not a problem. If you need a tetanus shot and you choose not to have one, there is a rare chance of getting tetanus. Sickness from tetanus can be serious.  HOME CARE INSTRUCTIONS   If a dressing was applied, change it at least once a day or as directed by your caregiver. If the bandage sticks, soak it off with warm water.   Wash the area with water and a mild soap to remove all the ointment 2 times a day. Rinse off the soap and pat  the area dry with a clean towel.   Reapply any ointment as directed by your caregiver. This will help prevent infection and keep the bandage from sticking. Use gauze over the wound and under the dressing to help keep the bandage from sticking.   Change your dressing right away if it becomes wet or dirty.   Only take over-the-counter or prescription medicines for pain, discomfort, or fever as directed by your caregiver.   Follow up with your caregiver within 24-48 hours for a wound check, or as directed. If you were not given a wound-check appointment, look closely at your abrasion for redness, swelling, or pus. These are signs of infection. SEEK IMMEDIATE MEDICAL CARE IF:   You have increasing pain in the wound.   You have redness, swelling, or tenderness around the wound.   You have pus coming from the wound.   You have a fever or persistent symptoms for more than 2-3 days.  You have a fever and your symptoms suddenly get worse.  You have a bad smell coming from the wound or dressing.  MAKE SURE YOU:   Understand these instructions.  Will watch your condition.  Will get help right away if you are not doing well or get worse. Document Released: 10/15/2004 Document Revised: 12/23/2011 Document Reviewed: 12/09/2010 [facility] Patient Information 2015 [place], [place]. This information is not intended to replace  advice given to you by your health care provider. Make sure you discuss any questions you have with your health care provider.   Emergency Department Resource Guide 1) Find a Doctor and Pay Out of Pocket Although you won't have to find out who is covered by your insurance plan, it is a good idea to ask around and get recommendations. You will then need to call the office and see if the doctor you have chosen will accept you as a new patient and what types of options they offer for patients who are self-pay. Some doctors offer discounts or will set up payment plans for  their patients who do not have insurance, but you will need to ask so you aren't surprised when you get to your appointment.  2) Contact Your Local Health Department Not all health departments have doctors that can see patients for sick visits, but many do, so it is worth a call to see if yours does. If you don't know where your local health department is, you can check in your phone book. The CDC also has a tool to help you locate your state's health department, and many state websites also have listings of all of their local health departments.  3) Find a Walk-in Clinic If your illness is not likely to be very severe or complicated, you may want to try a walk in clinic. These are popping up all over the country in pharmacies, drugstores, and shopping centers. They're usually staffed by nurse practitioners or physician assistants that have been trained to treat common illnesses and complaints. They're usually fairly quick and inexpensive. However, if you have serious medical issues or chronic medical problems, these are probably not your best option.  No Primary Care Doctor: - Call Health Connect at  [phone] - they can help you locate a primary care doctor that  accepts your insurance, provides certain services, etc. - Physician Referral Service- [phone]  Chronic Pain Problems: Organization         Address  Phone   Notes  [facility]  [phone] Patients need to be referred by their primary care doctor.   Medication Assistance: Organization         Address  Phone   Notes  [facility] Medication [facility] [address] [place], [place] [address] [phone] --Must be a resident of [facility] -- Must have NO insurance coverage whatsoever (no Medicaid/ Medicare, etc.) -- The pt. MUST have a primary care doctor that directs their care regularly and follows them in the community   [facility]ist  [phone]   [employer]  [phone]    Agencies that provide inexpensive medical care: Organization         Address  Phone   Notes  [facility]  [phone]   [facility]    [phone]   [facility] [address] [place], [place] [address] [phone]   [facility] [address] [place] [place] [phone]   Planned Parenthood    [phone]   [facility]    [phone]   [facility]  [address], [place] Phone:  [phone], Fax:  [phone] Hours of Operation:  9 am - 6 pm, M-F.  Also accepts Medicaid/Medicare and self-pay.  [facility]  [address], [place] Phone: [phone])  [phone], Fax: [phone]. Hours of Operation:  8:30 am - 5:30 pm, M-F.  Also accepts Medicaid and self-pay.  [facility] [place] [address] [place] Phone: [phone]   [facility] [address] Natasha Bence [place], [place] [phone], Ext. [phone] Mondays & Thursdays: 7-9 AM.  First 15 patients are seen on a first come, first serve basis.    Medicaid-accepting [facility] Providers:  Organization         Address  Phone   Notes  [facility] [address], [place] [phone] Also accepts self-pay patients.  [facility] [address] Laurell Josephs [place], [place]  [phone]   [facility] [address], [place] [phone]   [facility] [address], [place] [phone]   Renaye Rakers [address], [place]   [phone] Only accepts [place] patients after they have their name applied to their card.   Self-Pay (no insurance) in [facility]:  Organization         Address  Phone   Notes  Sickle Cell Patients, [facility] [address] [place], [place] [phone]   [facility] [address] [place], [place] [phone]   [facility] [place]  [address], [place] [phone]   [facility]/Dr. Osei-Bonsu  [address] [place] or [address], [place] [phone] Phone number for both [place] and [place] locations is the same.  [facility] [address], [place] [phone]   [facility] [address], [place] or [address] [phone]   [facility] [address], [place] [phone], phone; [phone], fax Sees patients 1st and 3rd Saturday of every month.  Must not qualify for public or private insurance (i.e. Medicaid, Medicare, [place] Health Choice, Veterans' Benefits)  Household income should be no more than 200% of the poverty level The clinic cannot treat you if you are pregnant or think you are pregnant  Sexually transmitted diseases are not treated at the clinic.    Dental Care: Organization         Address  Phone  Notes  [facility] Department of [facility] [address] [place], [place] [phone] Accepts children up to age 30 who are enrolled in [place] or [place] Health Choice; pregnant women with a Medicaid card; and children who have applied for Medicaid or [place] Health Choice, but were declined, whose parents can pay a reduced fee at time of service.  [facility] of [facility]  [address], [place] [phone] Accepts children up to age 58 who are enrolled in [place] or [place] Health Choice; pregnant women with a Medicaid card; and children who have applied for Medicaid or [place] Health Choice, but were declined, whose parents can pay a reduced fee at time of service.  [facility]  [address] [place], [place] [phone] Patients are  seen by appointment only. Walk-ins are not accepted. [facility] will see patients 56 years of age and older. Monday - Tuesday (8am-5pm) Most Wednesdays (8:30-5pm) $30 per visit, cash only  [facility]  [address], [employer] [place] [phone] Patients are seen by appointment only. Walk-ins are not accepted. [facility] will see patients 71 years of age and older. One Wednesday Evening (Monthly: Volunteer Based).  $30 per visit, cash only  [employer]  [phone] for adults; Children under age 32, call Graduate Pediatric Dentistry at [phone]. Children aged 52-14, please call [phone] to request a pediatric application.  Dental services are provided in all areas of dental care including fillings, crowns and bridges, complete and partial dentures, implants, gum treatment, root canals, and extractions. Preventive care is also provided. Treatment is provided to both adults and children. Patients are selected via a lottery and there is often a waiting list.   [facility] [address], [place]  [phone] [email or web address]   [facility] [address] [place], [place] [phone], Ext. [phone] Second and Fourth Thursday of each month, opens at 6:30 AM; Clinic ends at 9 AM.  Patients are seen on a first-come first-served basis, and a limited number are seen during each clinic.   [facility]  [address] Ether Griffins [place], [place] [phone]   Eligibility Requirements You must have lived in [place], [place], or [place] counties for at least the last three months.   You cannot be eligible for state or federal sponsored [place], including [employer], [place], or [employer].   You generally cannot be eligible for healthcare insurance through your employer.    How to apply: Eligibility screenings are held every Tuesday and Wednesday afternoon from 1:00 pm until 4:00  pm. You do not need an appointment for the interview!  [facility] [address] [place], [place] [phone]   [facility] Health Department  [phone]   [facility] Health Department  [phone]   [facility] Health Department  [phone]    Behavioral Health Resources in the Community: Intensive Outpatient Programs Organization         Address  Phone  Notes  [facility] Services [address], [place], [place] [phone]   [facility] Outpatient [address] [place], [place] [phone]   [facility] [address] [place], [place]  [phone]   [facility] Mental Health [address]  [place], [place] [phone] or [phone]   Substance Abuse Resources Organization         Address  Phone  Notes  [facility]  [phone]   [facility]  [phone]   [facility] [place]  [phone]   [place]oydene Flock  [phone]   Residential & Outpatient Substance Abuse Program  [phone]   Psychological Services Organization         Address  Phone  Notes  [facility]  [phone]   [facility] Services  [phone]   [facility] [address] [place] [phone] or [phone]    Mobile Crisis Teams Organization         Address  Phone  Notes  [facility], Mobile Crisis Care Unit  [phone]   [facility]  [address] [place], [place] [phone]   Doristine Locks [address] [place] [phone]    Self-Help/Support Groups Organization         Address  Phone             Notes  [facility] [place] - variety of support groups  336- [phone] Call for more information  Narcotics Anonymous (NA), [facility] [address], [place]  2 meetings at this location   Residential Sports administratorTreatment Programs Organization          Address  Phone  Notes  [facility] [address],    [place]  [phone]   [facility]  [address] [place] [phone], [place], [place] [phone]   [facility] [address], [place] [phone] Admissions: 8am-3pm M-F  [facility] [address]    [place], [place] [phone]   [facility] [address], [place], [place] [phone]   [facility] [address]  [place], [place] [phone]   [facility] - Intensive Outpatient [address], [place], [place] [phone]   [facility] ([facility] [address]  [place], [place] [phone] or [phone]   [facility] ([facility]) [address] [place], [place] [phone] Accepts Medicaid  [facility] [address]  [place] [phone] Substance Abuse/Addiction Treatment   [facility] Behavioral Health Resources Organization         Address  Phone  Notes  [facility]  [phone]) [phone]   Angie FavaJulie Brannon, PhD [address] [place], [place]   [phone] or [phone]) [phone]   [facility]   [address] [place], [place] [phone]) [phone]   [facility] [address], [place], [place] [phone]) [phone] Insurance/Medicaid/sponsorship through [facility]  [facility] [address]                                    [place], [place] [phone]) [phone] Therapy/tele-psych/case  [facility] [address]   [place], [place] [phone](336) [phone]    Dr. Lolly MustacheArfeen  [phone](336) [phone]   [facility]  [employer] [facility] 1) [address], [place] 2) [address], [place] 3)  [address], [place] [phone]) [phone](336) [phone]  [phone]) [phone]   [facility] Child Abuse Hotline [phone]) [phone] or [phone]) [phone] (After Hours)

## 2014-07-16 NOTE — ED Notes (Signed)
Pt has two small abrasions to left side of forehead. No drainage or swelling noted.

## 2014-07-16 NOTE — ED Notes (Signed)
Pt states that he is able to take vicodin
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
## 2014-07-17 ENCOUNTER — Ambulatory Visit (HOSPITAL_COMMUNITY): Payer: Self-pay

## 2015-01-30 ENCOUNTER — Encounter: Payer: Self-pay | Admitting: Internal Medicine

## 2015-01-30 ENCOUNTER — Ambulatory Visit (INDEPENDENT_AMBULATORY_CARE_PROVIDER_SITE_OTHER): Payer: Managed Care, Other (non HMO) | Admitting: Internal Medicine

## 2015-01-30 VITALS — BP 140/80 | HR 74 | Ht 69.0 in | Wt 184.0 lb

## 2015-01-30 DIAGNOSIS — I739 Peripheral vascular disease, unspecified: Secondary | ICD-10-CM

## 2015-01-30 DIAGNOSIS — K219 Gastro-esophageal reflux disease without esophagitis: Secondary | ICD-10-CM | POA: Diagnosis not present

## 2015-01-30 DIAGNOSIS — E785 Hyperlipidemia, unspecified: Secondary | ICD-10-CM | POA: Insufficient documentation

## 2015-01-30 DIAGNOSIS — J302 Other seasonal allergic rhinitis: Secondary | ICD-10-CM | POA: Diagnosis not present

## 2015-01-30 MED ORDER — CLOPIDOGREL BISULFATE 75 MG PO TABS: 75.0000 mg | ORAL_TABLET | Freq: Every day | ORAL | Status: DC

## 2015-01-30 NOTE — Assessment & Plan Note (Signed)
S/p stents He is not interested in following up with Dr. Jacinto HalimGanji Advised him to restart Plavix and ASA Advised him to stop smoking

## 2015-01-30 NOTE — Patient Instructions (Signed)
Allergic Rhinitis Allergic rhinitis is when the mucous membranes in the nose respond to allergens. Allergens are particles in the air that cause your body to have an allergic reaction. This causes you to release allergic antibodies. Through a chain of events, these eventually cause you to release histamine into the blood stream. Although meant to protect the body, it is this release of histamine that causes your discomfort, such as frequent sneezing, congestion, and an itchy, runny nose.  CAUSES Seasonal allergic rhinitis (hay fever) is caused by pollen allergens that may come from grasses, trees, and weeds. Year-round allergic rhinitis (perennial allergic rhinitis) is caused by allergens such as house dust mites, pet dander, and mold spores. SYMPTOMS  Nasal stuffiness (congestion).  Itchy, runny nose with sneezing and tearing of the eyes. DIAGNOSIS Your health care provider can help you determine the allergen or allergens that trigger your symptoms. If you and your health care provider are unable to determine the allergen, skin or blood testing may be used. Your health care provider will diagnose your condition after taking your health history and performing a physical exam. Your health care provider may assess you for other related conditions, such as asthma, pink eye, or an ear infection. TREATMENT Allergic rhinitis does not have a cure, but it can be controlled by:  Medicines that block allergy symptoms. These may include allergy shots, nasal sprays, and oral antihistamines.  Avoiding the allergen. Hay fever may often be treated with antihistamines in pill or nasal spray forms. Antihistamines block the effects of histamine. There are over-the-counter medicines that may help with nasal congestion and swelling around the eyes. Check with your health care provider before taking or giving this medicine. If avoiding the allergen or the medicine prescribed do not work, there are many new medicines  your health care provider can prescribe. Stronger medicine may be used if initial measures are ineffective. Desensitizing injections can be used if medicine and avoidance does not work. Desensitization is when a patient is given ongoing shots until the body becomes less sensitive to the allergen. Make sure you follow up with your health care provider if problems continue. HOME CARE INSTRUCTIONS It is not possible to completely avoid allergens, but you can reduce your symptoms by taking steps to limit your exposure to them. It helps to know exactly what you are allergic to so that you can avoid your specific triggers. SEEK MEDICAL CARE IF:  You have a fever.  You develop a cough that does not stop easily (persistent).  You have shortness of breath.  You start wheezing.  Symptoms interfere with normal daily activities.   This information is not intended to replace advice given to you by your health care provider. Make sure you discuss any questions you have with your health care provider.   Document Released: 09/30/2000 Document Revised: 01/26/2014 Document Reviewed: 09/12/2012 Elsevier Interactive Patient Education 2016 [employer]  

## 2015-01-30 NOTE — Assessment & Plan Note (Signed)
He declines CMET and Lipid Profile today Encouraged him to consume a low fat diet

## 2015-01-30 NOTE — Progress Notes (Signed)
HPI  Pt presents to the clinic today to establish care and for management of the conditions listed below. He has not had a PCP in many years. [facility]  Blood clots in his left lower extremity. S/p stents. He was following with Dr. Jacinto HalimGanji. He has not seen him in a while. He is no longer taking the Aspirin, Plavix or Metoprolol.  GERD: Triggered by spicy foods, tomato based products, eating too much or eating late at night. He takes Ranitidine daily.  HLD: He is prescribed Lipitor bu reports he does not take it. He does not try to consume a low fat diet.  Seasonal Allergies: Usually worse when the weather is wet and cold. He does have a runny nose, post nasal drip and cough currently. He is not blowing anything out of his nose. The cough is nonproductive. He denies fever, chills or body aches. He has take Mucinex with minimal relief. He has not had sick contacts that he is aware of.  Flu: never Tetanus: 2014 Vision Screening: as needed Dentist: needed  Past Medical History  Diagnosis Date  . Allergy   . Hypercholesteremia     Current Outpatient Prescriptions  Medication Sig Dispense Refill  . albuterol (PROVENTIL HFA;VENTOLIN HFA) 108 (90 BASE) MCG/ACT inhaler Inhale 1-2 puffs into the lungs every 6 (six) hours as needed for wheezing or shortness of breath. 1 Inhaler 0  . atorvastatin (LIPITOR) 20 MG tablet Take 1 tablet (20 mg total) by mouth daily. 30 tablet 0  . ranitidine (ZANTAC) 150 MG tablet Take 0.5 tablets (75 mg total) by mouth daily as needed for heartburn. 15 tablet 0  . aspirin EC 81 MG tablet Take 1 tablet (81 mg total) by mouth daily. (Patient not taking: Reported on 01/30/2015) 30 tablet 0  . cilostazol (PLETAL) 100 MG tablet Take 0.5 tablets (50 mg total) by mouth 2 (two) times daily. (Patient not taking: Reported on 01/30/2015) 30 tablet 0  . esomeprazole (NEXIUM) 20 MG capsule Take 1 capsule (20 mg total) by mouth daily as needed (heartburn/acid  reflux). (Patient not taking: Reported on 01/30/2015) 30 capsule 0  . metoprolol tartrate (LOPRESSOR) 25 MG tablet Take 1 tablet (25 mg total) by mouth 2 (two) times daily. (Patient not taking: Reported on 01/30/2015) 60 tablet 0  . oxyCODONE-acetaminophen (PERCOCET) 5-325 MG per tablet Take 1-2 tablets by mouth every 4 (four) hours as needed. (Patient not taking: Reported on 01/30/2015) 10 tablet 0  . triamcinolone (NASACORT ALLERGY 24HR) 55 MCG/ACT AERO nasal inhaler Place 2 sprays into the nose daily as needed (allergies). (Patient not taking: Reported on 01/30/2015) 1 Inhaler 12   No current facility-administered medications for this visit.    Allergies  Allergen Reactions  . Codeine     Migraine     Family History  Problem Relation Age of Onset  . Cancer Father     oral  . Stroke Mother   . Cancer Maternal Grandmother   . Cancer Paternal Grandmother     breast  . Cancer Paternal Grandfather   . Diabetes Neg Hx     Social History   Social History  . Marital Status: Single    Spouse Name: N/A  . Number of Children: N/A  . Years of Education: N/A   Occupational History  . Not on file.   Social History Main Topics  . Smoking status: Current Every Day Smoker -- 2.00 packs/day for 30 years  . Smokeless tobacco:  Never Used     Comment: uses e cigarette  . Alcohol Use: No  . Drug Use: No  . Sexual Activity: No   Other Topics Concern  . Not on file   Social History Narrative    ROS:  Constitutional: Denies fever, malaise, fatigue, headache or abrupt weight changes.  HEENT: Pt reports runny nose, sore throat. Denies eye pain, eye redness, ear pain, ringing in the ears, wax buildup,  nasal congestion, bloody nose. Respiratory: Pt reports cough. Denies difficulty breathing, shortness of breath, or sputum production.   Cardiovascular: Denies chest pain, chest tightness, palpitations or swelling in the hands or feet.  Gastrointestinal: Denies abdominal pain, bloating,  constipation, diarrhea or blood in the stool.  Neurological: Denies dizziness, difficulty with memory, difficulty with speech or problems with balance and coordination.  Psych: Denies anxiety, depression, SI/HI.  No other specific complaints in a complete review of systems (except as listed in HPI above).  PE:  BP 140/80 mmHg  Pulse 74  Ht 5\' 9"  (1.753 m)  Wt 184 lb (83.462 kg)  BMI 27.16 kg/m2  SpO2 98% Wt Readings from Last 3 Encounters:  01/30/15 184 lb (83.462 kg)  07/16/14 180 lb (81.647 kg)  01/19/14 178 lb (80.74 kg)    General: Appears his stated age, well developed, well nourished in NAD. HEENT: Head: normal shape and size, no sinus tenderness noted; Eyes: sclera white, no icterus, conjunctiva pinkt; Ears: Tm's gray and intact, normal light reflex;Throat/Mouth: Teeth present, mucosa pink and moist, no lesions or ulcerations noted.  Neck: No adenopathy noted.  Cardiovascular: Normal rate and rhythm. S1,S2 noted.  No murmur, rubs or gallops noted.  Pulmonary/Chest: Normal effort and positive vesicular breath sounds. No respiratory distress. No wheezes, rales or ronchi noted.  Abdomen: Soft and nontender. Normal bowel sounds. Neurological: Alert and oriented. Psychiatric: Mood and affect normal. Behavior is normal. Judgment and thought content normal.     BMET    Component Value Date/Time   NA 138 12/29/2013 0120   K 4.2 12/29/2013 0120   CL 100 12/29/2013 0120   CO2 22 12/29/2013 0120   GLUCOSE 90 12/29/2013 0120   BUN 11 12/29/2013 0120   CREATININE 1.13 12/29/2013 0120   CREATININE 1.04 03/28/2013 1350   CALCIUM 8.8 12/29/2013 0120   GFRNONAA 78* 12/29/2013 0120   GFRAA >90 12/29/2013 0120    Lipid Panel     Component Value Date/Time   CHOL 210* 03/28/2013 1350   TRIG 203* 03/28/2013 1350   HDL 32* 03/28/2013 1350   CHOLHDL 6.6 03/28/2013 1350   VLDL 41* 03/28/2013 1350   LDLCALC 137* 03/28/2013 1350    CBC    Component Value Date/Time   WBC 22.0*  12/29/2013 0120   WBC 11.4* 03/28/2013 1352   RBC 4.88 12/29/2013 0120   RBC 5.57 03/28/2013 1352   HGB 14.2 12/29/2013 0120   HGB 16.1 03/28/2013 1352   HCT 40.8 12/29/2013 0120   HCT 49.7 03/28/2013 1352   PLT 231 12/29/2013 0120   MCV 83.6 12/29/2013 0120   MCV 89.3 03/28/2013 1352   MCH 29.1 12/29/2013 0120   MCH 28.9 03/28/2013 1352   MCHC 34.8 12/29/2013 0120   MCHC 32.4 03/28/2013 1352   RDW 14.1 12/29/2013 0120   LYMPHSABS 3.3 04/11/2013 0100   MONOABS 1.0 04/11/2013 0100   EOSABS 0.4 04/11/2013 0100   BASOSABS 0.0 04/11/2013 0100    Hgb A1C No results found for: HGBA1C   Assessment and Plan:

## 2015-01-30 NOTE — Assessment & Plan Note (Signed)
Discussed avoid triggers Continue Ranitadine

## 2015-01-30 NOTE — Assessment & Plan Note (Signed)
Advised him to take Claritin and Nasocort OTC

## 2015-03-15 ENCOUNTER — Encounter: Payer: Self-pay | Admitting: Internal Medicine

## 2015-03-15 ENCOUNTER — Ambulatory Visit (INDEPENDENT_AMBULATORY_CARE_PROVIDER_SITE_OTHER): Payer: Managed Care, Other (non HMO) | Admitting: Internal Medicine

## 2015-03-15 ENCOUNTER — Encounter (INDEPENDENT_AMBULATORY_CARE_PROVIDER_SITE_OTHER): Payer: Self-pay

## 2015-03-15 VITALS — BP 142/80 | HR 67 | Temp 97.9°F | Ht 69.25 in | Wt 180.0 lb

## 2015-03-15 DIAGNOSIS — Z0000 Encounter for general adult medical examination without abnormal findings: Secondary | ICD-10-CM | POA: Diagnosis not present

## 2015-03-15 DIAGNOSIS — R7989 Other specified abnormal findings of blood chemistry: Secondary | ICD-10-CM | POA: Diagnosis not present

## 2015-03-15 LAB — CBC
HCT: 44.6 % (ref 39.0–52.0)
HEMOGLOBIN: 15.2 g/dL (ref 13.0–17.0)
MCHC: 34.1 g/dL (ref 30.0–36.0)
MCV: 86.1 fl (ref 78.0–100.0)
Platelets: 233 10*3/uL (ref 150.0–400.0)
RBC: 5.19 Mil/uL (ref 4.22–5.81)
RDW: 15.4 % (ref 11.5–15.5)
WBC: 16.3 10*3/uL — AB (ref 4.0–10.5)

## 2015-03-15 LAB — COMPREHENSIVE METABOLIC PANEL
ALK PHOS: 96 U/L (ref 39–117)
ALT: 15 U/L (ref 0–53)
AST: 65 U/L — ABNORMAL HIGH (ref 0–37)
Albumin: 4.2 g/dL (ref 3.5–5.2)
BUN: 14 mg/dL (ref 6–23)
CHLORIDE: 106 meq/L (ref 96–112)
CO2: 28 meq/L (ref 19–32)
Calcium: 9.3 mg/dL (ref 8.4–10.5)
Creatinine, Ser: 1.51 mg/dL — ABNORMAL HIGH (ref 0.40–1.50)
GFR: 53.57 mL/min — AB (ref >60.00)
GLUCOSE: 122 mg/dL — AB (ref 70–99)
POTASSIUM: 3.8 meq/L (ref 3.5–5.1)
Sodium: 141 mEq/L (ref 135–145)
Total Bilirubin: 0.5 mg/dL (ref 0.2–1.2)
Total Protein: 7.4 g/dL (ref 6.0–8.3)

## 2015-03-15 LAB — LIPID PANEL
CHOL/HDL RATIO: 10
Cholesterol: 219 mg/dL — ABNORMAL HIGH (ref 0–200)
HDL: 22.6 mg/dL — AB (ref >39.00)
NonHDL: 196.62
Triglycerides: 285 mg/dL — ABNORMAL HIGH (ref 0.0–149.0)
VLDL: 57 mg/dL — AB (ref 0.0–40.0)

## 2015-03-15 LAB — LDL CHOLESTEROL, DIRECT: LDL DIRECT: 134 mg/dL

## 2015-03-15 NOTE — Progress Notes (Signed)
Pre visit review using our clinic review tool, if applicable. No additional management support is needed unless otherwise documented below in the visit note. 

## 2015-03-15 NOTE — Progress Notes (Signed)
Subjective:    Patient ID: [name] [name], male    DOB: [birth year], 45 y.o.   MRN: [id number]  HPI  Pt presents to the clinic today for his annual exam  Flu: never Tetanus: 2014 Vision Screening: as needed Dentist: as needed  Diet: He does consume meat. He eats fruits 2-3 times per week. He eats vegetables daily. He does consume fried food. He drinks mostly coffee, tea and Dr. Reino Kent. Exercise: None  Review of Systems      Past Medical History  Diagnosis Date  . Allergy   . Hypercholesteremia     Current Outpatient Prescriptions  Medication Sig Dispense Refill  . aspirin EC 81 MG tablet Take 1 tablet (81 mg total) by mouth daily. (Patient not taking: Reported on 01/30/2015) 30 tablet 0  . atorvastatin (LIPITOR) 20 MG tablet Take 1 tablet (20 mg total) by mouth daily. 30 tablet 0  . clopidogrel (PLAVIX) 75 MG tablet Take 1 tablet (75 mg total) by mouth daily. 30 tablet 2  . ranitidine (ZANTAC) 150 MG tablet Take 0.5 tablets (75 mg total) by mouth daily as needed for heartburn. 15 tablet 0   No current facility-administered medications for this visit.    Allergies  Allergen Reactions  . Codeine     Migraine     Family History  Problem Relation Age of Onset  . Cancer Father     oral  . Stroke Mother   . Cancer Maternal Grandmother   . Cancer Paternal Grandmother     breast  . Cancer Paternal Grandfather   . Diabetes Neg Hx     Social History   Social History  . Marital Status: Single    Spouse Name: N/A  . Number of Children: N/A  . Years of Education: N/A   Occupational History  . Not on file.   Social History Main Topics  . Smoking status: Current Every Day Smoker -- 2.00 packs/day for 30 years  . Smokeless tobacco: Never Used     Comment: uses e cigarette  . Alcohol Use: No  . Drug Use: No  . Sexual Activity: No   Other Topics Concern  . Not on file   Social History Narrative     Constitutional: Denies fever, malaise, fatigue, headache  or abrupt weight changes.  HEENT: Denies eye pain, eye redness, ear pain, ringing in the ears, wax buildup, runny nose, nasal congestion, bloody nose, or sore throat. Respiratory: Denies difficulty breathing, shortness of breath, cough or sputum production.   Cardiovascular: Denies chest pain, chest tightness, palpitations or swelling in the hands or feet.  Gastrointestinal: Denies abdominal pain, bloating, constipation, diarrhea or blood in the stool.  GU: Denies urgency, frequency, pain with urination, burning sensation, blood in urine, odor or discharge. Musculoskeletal: Denies decrease in range of motion, difficulty with gait, muscle pain or joint pain and swelling.  Skin: Pt reports dry skin. Denies redness, rashes, lesions or ulcercations.  Neurological: Denies dizziness, difficulty with memory, difficulty with speech or problems with balance and coordination.  Psych: Denies anxiety, depression, SI/HI.  No other specific complaints in a complete review of systems (except as listed in HPI above).   Objective:   Physical Exam   BP 142/80 mmHg  Pulse 67  Temp(Src) 97.9 F (36.6 C) (Oral)  Ht 5' 9.25" (1.759 m)  Wt 180 lb (81.647 kg)  BMI 26.39 kg/m2  SpO2 97% Wt Readings from Last 3 Encounters:  03/15/15 180 lb (81.647 kg)  01/30/15  184 lb (83.462 kg)  07/16/14 180 lb (81.647 kg)    General: Appears his stated age, well developed, well nourished in NAD. Skin: Lichenification noted on bilateral arms. HEENT: Head: normal shape and size; Eyes: sclera white, no icterus, conjunctiva pink, PERRLA and EOMs intact; Ears: Tm's gray and intact, normal light reflex; Nose: mucosa pink and moist, septum midline; Throat/Mouth: Teeth decay present, mucosa pink and moist, no exudate, lesions or ulcerations noted.  Neck:  Neck supple, trachea midline. No masses, lumps or thyromegaly present.  Cardiovascular: Normal rate and rhythm. S1,S2 noted.  No murmur, rubs or gallops noted. No JVD or BLE  edema. No carotid bruits noted. Pulmonary/Chest: Normal effort and positive vesicular breath sounds. No respiratory distress. No wheezes, rales or ronchi noted.  Abdomen: Soft and nontender. Normal bowel sounds. No distention or masses noted. Liver, spleen and kidneys non palpable. Musculoskeletal: Strength 5/5 BUE/BLE. No signs of joint swelling. No difficulty with gait.  Neurological: Alert and oriented. Cranial nerves II-XII grossly intact. Coordination normal.  Psychiatric: Mood and affect normal. Behavior is normal. Judgment and thought content normal.     BMET    Component Value Date/Time   NA 138 12/29/2013 0120   K 4.2 12/29/2013 0120   CL 100 12/29/2013 0120   CO2 22 12/29/2013 0120   GLUCOSE 90 12/29/2013 0120   BUN 11 12/29/2013 0120   CREATININE 1.13 12/29/2013 0120   CREATININE 1.04 03/28/2013 1350   CALCIUM 8.8 12/29/2013 0120   GFRNONAA 78* 12/29/2013 0120   GFRAA >90 12/29/2013 0120    Lipid Panel     Component Value Date/Time   CHOL 210* 03/28/2013 1350   TRIG 203* 03/28/2013 1350   HDL 32* 03/28/2013 1350   CHOLHDL 6.6 03/28/2013 1350   VLDL 41* 03/28/2013 1350   LDLCALC 137* 03/28/2013 1350    CBC    Component Value Date/Time   WBC 22.0* 12/29/2013 0120   WBC 11.4* 03/28/2013 1352   RBC 4.88 12/29/2013 0120   RBC 5.57 03/28/2013 1352   HGB 14.2 12/29/2013 0120   HGB 16.1 03/28/2013 1352   HCT 40.8 12/29/2013 0120   HCT 49.7 03/28/2013 1352   PLT 231 12/29/2013 0120   MCV 83.6 12/29/2013 0120   MCV 89.3 03/28/2013 1352   MCH 29.1 12/29/2013 0120   MCH 28.9 03/28/2013 1352   MCHC 34.8 12/29/2013 0120   MCHC 32.4 03/28/2013 1352   RDW 14.1 12/29/2013 0120   LYMPHSABS 3.3 04/11/2013 0100   MONOABS 1.0 04/11/2013 0100   EOSABS 0.4 04/11/2013 0100   BASOSABS 0.0 04/11/2013 0100    Hgb A1C No results found for: HGBA1C      Assessment & Plan:   Preventative Health Maintenance:  He declines flu shot Tetanus UTD Encouraged him to see an  eye doctor and dentist at least annually Encouraged him to consume a healthy diet and start an exercise regimen CBC, CMET, Lipid and HIV today Encouraged smoking cessation  RTC in 6 months to follow up chronic conditions

## 2015-03-15 NOTE — Patient Instructions (Signed)

## 2015-03-16 LAB — HIV ANTIBODY (ROUTINE TESTING W REFLEX): HIV 1&2 Ab, 4th Generation: NONREACTIVE

## 2015-03-22 ENCOUNTER — Other Ambulatory Visit: Payer: Self-pay | Admitting: Internal Medicine

## 2015-03-22 ENCOUNTER — Encounter: Payer: Self-pay | Admitting: Internal Medicine

## 2015-03-22 ENCOUNTER — Ambulatory Visit (INDEPENDENT_AMBULATORY_CARE_PROVIDER_SITE_OTHER): Payer: Managed Care, Other (non HMO) | Admitting: Internal Medicine

## 2015-03-22 ENCOUNTER — Telehealth: Payer: Self-pay | Admitting: Internal Medicine

## 2015-03-22 VITALS — BP 136/80 | HR 74 | Temp 97.7°F | Wt 180.0 lb

## 2015-03-22 DIAGNOSIS — I739 Peripheral vascular disease, unspecified: Secondary | ICD-10-CM | POA: Diagnosis not present

## 2015-03-22 DIAGNOSIS — R29898 Other symptoms and signs involving the musculoskeletal system: Secondary | ICD-10-CM

## 2015-03-22 DIAGNOSIS — I70219 Atherosclerosis of native arteries of extremities with intermittent claudication, unspecified extremity: Secondary | ICD-10-CM

## 2015-03-22 NOTE — Progress Notes (Signed)
Subjective:    Patient ID: [name] [name], male    DOB: 08/14/1970, 45 y.o.   MRN: [id number]  HPI  Pt presents to the clinic today with c/o bilateral leg pain and weakness. This is intermittent. He describes the pain as burning and sore. The pain seems worse with walking, better with siting. He does have some tingling but denies numbness. He denies back injury. He denies low back pain. He does have a history of PVD, s/p stent placement x 3. He was not taking his Lipitor, Plavix or ASA, so was advised to restart it 01/30/15. He was seeing Dr. Jacinto HalimGanji in the past, but reports he has not seem him in a while and had no interest in making a follow up appt with him.  Review of Systems      Past Medical History  Diagnosis Date  . Allergy   . Hypercholesteremia     Current Outpatient Prescriptions  Medication Sig Dispense Refill  . aspirin EC 81 MG tablet Take 1 tablet (81 mg total) by mouth daily. 30 tablet 0  . atorvastatin (LIPITOR) 20 MG tablet Take 1 tablet (20 mg total) by mouth daily. 30 tablet 0  . clopidogrel (PLAVIX) 75 MG tablet Take 1 tablet (75 mg total) by mouth daily. 30 tablet 2  . ranitidine (ZANTAC) 150 MG tablet Take 0.5 tablets (75 mg total) by mouth daily as needed for heartburn. 15 tablet 0   No current facility-administered medications for this visit.    Allergies  Allergen Reactions  . Codeine     Migraine     Family History  Problem Relation Age of Onset  . Cancer Father     oral  . Stroke Mother   . Cancer Maternal Grandmother   . Cancer Paternal Grandmother     breast  . Cancer Paternal Grandfather   . Diabetes Neg Hx     Social History   Social History  . Marital Status: Single    Spouse Name: N/A  . Number of Children: N/A  . Years of Education: N/A   Occupational History  . Not on file.   Social History Main Topics  . Smoking status: Current Every Day Smoker -- 2.00 packs/day for 30 years  . Smokeless tobacco: Never Used  . Alcohol  Use: No  . Drug Use: No  . Sexual Activity: No   Other Topics Concern  . Not on file   Social History Narrative     Constitutional: Denies fever, malaise, fatigue, headache or abrupt weight changes.  Musculoskeletal: Pt reports bilateral leg weakness. Denies decrease in range of motion, difficulty with gait, muscle pain or joint pain and swelling.  Skin: Denies redness, rashes, lesions or ulcercations.  Neurological: Denies dizziness, difficulty with memory, difficulty with speech or problems with balance and coordination.    No other specific complaints in a complete review of systems (except as listed in HPI above).  Objective:   Physical Exam  BP 136/80 mmHg  Pulse 74  Wt 180 lb (81.647 kg)  SpO2 97% Wt Readings from Last 3 Encounters:  03/22/15 180 lb (81.647 kg)  03/15/15 180 lb (81.647 kg)  01/30/15 184 lb (83.462 kg)    General: Appears his stated age, well developed, well nourished in NAD. Skin: Dry and lichened. Cardiovascular: Normal rate and rhythm. S1,S2 noted.  No murmur, rubs or gallops noted. No BLE edema. Feet warm, pedal pulses very faintly palpable. Pulmonary/Chest: Normal effort and positive vesicular breath sounds. No  respiratory distress. No wheezes, rales or ronchi noted.  Neurological: Alert and oriented. Sensation intact to BLE.    BMET    Component Value Date/Time   NA 141 03/15/2015 1442   K 3.8 03/15/2015 1442   CL 106 03/15/2015 1442   CO2 28 03/15/2015 1442   GLUCOSE 122* 03/15/2015 1442   BUN 14 03/15/2015 1442   CREATININE 1.51* 03/15/2015 1442   CREATININE 1.04 03/28/2013 1350   CALCIUM 9.3 03/15/2015 1442   GFRNONAA 78* 12/29/2013 0120   GFRAA >90 12/29/2013 0120    Lipid Panel     Component Value Date/Time   CHOL 219* 03/15/2015 1442   TRIG 285.0* 03/15/2015 1442   HDL 22.60* 03/15/2015 1442   CHOLHDL 10 03/15/2015 1442   VLDL 57.0* 03/15/2015 1442   LDLCALC 137* 03/28/2013 1350    CBC    Component Value Date/Time     WBC 16.3* 03/15/2015 1442   WBC 11.4* 03/28/2013 1352   RBC 5.19 03/15/2015 1442   RBC 5.57 03/28/2013 1352   HGB 15.2 03/15/2015 1442   HGB 16.1 03/28/2013 1352   HCT 44.6 03/15/2015 1442   HCT 49.7 03/28/2013 1352   PLT 233.0 03/15/2015 1442   MCV 86.1 03/15/2015 1442   MCV 89.3 03/28/2013 1352   MCH 29.1 12/29/2013 0120   MCH 28.9 03/28/2013 1352   MCHC 34.1 03/15/2015 1442   MCHC 32.4 03/28/2013 1352   RDW 15.4 03/15/2015 1442   LYMPHSABS 3.3 04/11/2013 0100   MONOABS 1.0 04/11/2013 0100   EOSABS 0.4 04/11/2013 0100   BASOSABS 0.0 04/11/2013 0100    Hgb A1C No results found for: HGBA1C       Assessment & Plan:   Bilateral leg weakness, claudication:  ? Stents occluded He is now back on Lipitor and Plavix Discussed stopping smoking- he reports he has tried everything He does not want to try Chantix Advised him to call Dr. Jacinto Halim to make a follow up appt- will place a new referral if needed  RTC as needed or if symptoms persist or worsen

## 2015-03-22 NOTE — Progress Notes (Signed)
Pre visit review using our clinic review tool, if applicable. No additional management support is needed unless otherwise documented below in the visit note. 

## 2015-03-22 NOTE — Patient Instructions (Signed)
Intermittent Claudication Intermittent claudication is pain in your leg that occurs when you walk or exercise and goes away when you rest. The pain can occur in one or both legs. CAUSES Intermittent claudication is caused by the buildup of plaque within the major arteries in the body (atherosclerosis). The plaque, which makes arteries stiff and narrow, prevents enough blood from reaching your leg muscles. The pain occurs when you walk or exercise because your muscles need more blood when you are moving and exercising. RISK FACTORS Risk factors include:  A family history of atherosclerosis.  A personal history of stroke or heart disease.  Older age.  Being inactive or overweight.  Smoking cigarettes.  Having another health condition such as:  Diabetes.  High blood pressure.  High cholesterol. SIGNS AND SYMPTOMS  Your hip or leg may:   Ache.  Cramp.  Feel tight.  Feel weak.  Feel heavy. Over time, you may feel pain in your calf, thigh, or hip. DIAGNOSIS  Your health care provider may diagnose intermittent claudication based on your symptoms and medical history. Your health care provider may also do tests to learn more about your condition. These may include:  Blood tests.  An ultrasound.  Imaging tests such as angiography, magnetic resonance angiography (MRA), and computed tomography angiography (CTA). TREATMENT You may be treated for problems such as:  High blood pressure.  High cholesterol.  Diabetes. Other treatments may include:  Lifestyle changes such as:  Starting an exercise program.  Losing weight.  Quitting smoking.  Medicines to help restore blood flow through your legs.  Blood vessel surgery (angioplasty) to restore blood flow if your intermittent claudication is caused by severe peripheral artery disease. HOME CARE INSTRUCTIONS  Manage any other health conditions you have.  Eat a diet low in saturated fats and calories to maintain a  healthy weight.  Quit smoking, if you smoke.  Take medicines only as directed by your health care provider.  If your health care provider recommended an exercise program for you, follow it as directed. Your exercise program may involve:  Walking three or more times a week.  Walking until you have certain symptoms of intermittent claudication.  Resting until symptoms go away.  Gradually increasing walking time to about 50 minutes a day. SEEK MEDICAL CARE IF: Your condition is not getting better or is getting worse. SEEK IMMEDIATE MEDICAL CARE IF:   You have chest pain.  You have difficulty breathing.  You develop arm weakness.  You have trouble speaking.  Your face begins to droop. MAKE SURE YOU:  Understand these instructions.  Will watch your condition.  Will get help if you are not doing well or get worse.   This information is not intended to replace advice given to you by your health care provider. Make sure you discuss any questions you have with your health care provider.   Document Released: 11/08/2003 Document Revised: 01/26/2014 Document Reviewed: 04/13/2013 Elsevier Interactive Patient Education 2016 Else[employer] Inc.  

## 2015-03-22 NOTE — Telephone Encounter (Signed)
Patient was seen today and told to call back if he needs a referral.  Patient said he needs [facility] to [facility].  [place].  Patient needs a weeks notice to get coverage at work. [facility] has an outstanding [facility].

## 2015-03-22 NOTE — Telephone Encounter (Signed)
Referral placed.

## 2015-04-16 ENCOUNTER — Other Ambulatory Visit: Payer: Self-pay | Admitting: *Deleted

## 2015-04-16 MED ORDER — CLOPIDOGREL BISULFATE 75 MG PO TABS: 75.0000 mg | ORAL_TABLET | Freq: Every day | ORAL | Status: DC

## 2015-04-16 NOTE — Telephone Encounter (Signed)
Pt left voicemail at triage, requesting Rx filled, Nicki ReaperRegina Baity put Rx as no print instead of normal so Rx sent to [facility]

## 2015-04-19 ENCOUNTER — Ambulatory Visit: Payer: Managed Care, Other (non HMO) | Admitting: Internal Medicine

## 2015-04-24 ENCOUNTER — Ambulatory Visit (INDEPENDENT_AMBULATORY_CARE_PROVIDER_SITE_OTHER): Payer: Managed Care, Other (non HMO) | Admitting: Cardiovascular Disease

## 2015-04-24 ENCOUNTER — Encounter: Payer: Self-pay | Admitting: *Deleted

## 2015-04-24 ENCOUNTER — Encounter: Payer: Self-pay | Admitting: Cardiovascular Disease

## 2015-04-24 VITALS — BP 132/86 | HR 66 | Ht 69.0 in | Wt 185.0 lb

## 2015-04-24 DIAGNOSIS — I739 Peripheral vascular disease, unspecified: Secondary | ICD-10-CM

## 2015-04-24 NOTE — Assessment & Plan Note (Signed)
The patient was referred by his primary care provider for symptomatic left calf claudication. He does have risk factors including continued tobacco abuse, treated hypertension and hyperlipidemia. He apparently had peripheral angiography performed by Dr. Jacinto HalimGanji 05/16/13 with stenting of both iliac arteries. He had a 9 mm x 29 mm Omnilink stent placed in the right iliac, an 8 mm x 19 mm Omnilink  placed in the left iliac along with an 8 mm x 80 mm absolute Pro. He does continue to smoke. He had symptomatically for approximately one year and has had recurrent left calf claudication which is lifestyle limiting. He was referred here because unfortunately he did not meet his financial obligations in Dr. Verl DickerGanji's office. I'm going to get lower extremity arterial Doppler studies make further recommendations based on these.

## 2015-04-24 NOTE — Progress Notes (Signed)
04/24/2015 [name] [name]   12/17/1970  [id number]  Primary Physician [name] Reaper, NP Primary Cardiologist: [name] [name] [name] [name] [name] [name]   HPI:  [name] [name] is a pleasant 45 year old single Caucasian male father of one 63 year old son who is referred by [name] [name] registered ,nurse practitioner, for peripheral vascular evaluation. He has a history of acute tobacco abuse having smoked 40-60 pack years, treated hypertension and hyperlipidemia. She has never had a heart attack or stroke and denies chest pain or shortness of breath. He saw [name] [name]  years ago and ultimately underwent angiography/ 05/16/13 revealing bilateral iliac disease both of which were stented with balloon expandable and self expanding stents. He enjoyed symptomatic relief for approximately one year and then developed recurrent left calf claudication.   Current Outpatient Prescriptions  Medication Sig Dispense Refill  . aspirin EC 81 MG tablet Take 1 tablet (81 mg total) by mouth daily. 30 tablet 0  . atorvastatin (LIPITOR) 20 MG tablet Take 1 tablet (20 mg total) by mouth daily. 30 tablet 0  . clopidogrel (PLAVIX) 75 MG tablet Take 1 tablet (75 mg total) by mouth daily. 30 tablet 2  . ranitidine (ZANTAC) 150 MG tablet Take 0.5 tablets (75 mg total) by mouth daily as needed for heartburn. 15 tablet 0   No current facility-administered medications for this visit.    Allergies  Allergen Reactions  . Codeine     Migraine     Social History   Social History  . Marital Status: Single    Spouse Name: N/A  . Number of Children: N/A  . Years of Education: N/A   Occupational History  . Not on file.   Social History Main Topics  . Smoking status: Current Every Day Smoker -- 2.00 packs/day for 30 years  . Smokeless tobacco: Never Used  . Alcohol Use: No  . Drug Use: No  . Sexual Activity: No   Other Topics Concern  . Not on file   Social History Narrative     Review of Systems: General:  negative for chills, fever, night sweats or weight changes.  Cardiovascular: negative for chest pain, dyspnea on exertion, edema, orthopnea, palpitations, paroxysmal nocturnal dyspnea or shortness of breath Dermatological: negative for rash Respiratory: negative for cough or wheezing Urologic: negative for hematuria Abdominal: negative for nausea, vomiting, diarrhea, bright red blood per rectum, melena, or hematemesis Neurologic: negative for visual changes, syncope, or dizziness All other systems reviewed and are otherwise negative except as noted above.    Blood pressure 132/86, pulse 66, height  (1.753 m), weight 185 lb (83.915 kg).  General appearance: alert and no distress Neck: no adenopathy, no carotid bruit, no JVD, supple, symmetrical, trachea midline and thyroid not enlarged, symmetric, no tenderness/mass/nodules Lungs: clear to auscultation bilaterally Heart: regular rate and rhythm, S1, S2 normal, no murmur, click, rub or gallop Extremities: extremities normal, atraumatic, no cyanosis or edema and 2+ femorals bilaterally with a faint left femoral bruit.  EKG not performed today  ASSESSMENT AND PLAN:   Claudication in peripheral vascular disease (HCC) The patient was referred by his primary care provider for symptomatic left calf claudication. He does have risk factors including continued tobacco abuse, treated hypertension and hyperlipidemia. He apparently had peripheral angiography performed by [name] [name] 05/16/13 with stenting of both iliac arteries. He had a 9 mm x 29 mm Omnilink stent placed in the right iliac, an 8 mm x 19 mm Omnilink  placed in the left iliac  along with an 8 mm x 80 mm absolute Pro. He does continue to smoke. He had symptomatically for approximately one year and has had recurrent left calf claudication which is lifestyle limiting. He was referred here because unfortunately he did not meet his financial obligations in [name] [name]'s office. I'm going to get  lower extremity arterial Doppler studies make further recommendations based on these.      [name] [name] [name] [name] FACP,FACC,FAHA, [facility] 04/24/2015 10:59 AM

## 2015-04-24 NOTE — Patient Instructions (Signed)
Medication Instructions:  Your physician recommends that you continue on your current medications as directed. Please refer to the Current Medication list given to you today.   Labwork: NONE  Testing/Procedures: Your physician has requested that you have a lower extremity arterial doppler- During this test, ultrasound is used to evaluate arterial blood flow in the legs. Allow approximately one hour for this exam.   Follow-Up: PENDING RESULTS.  Any Other Special Instructions Will Be Listed Below (If Applicable).     If you need a refill on your cardiac medications before your next appointment, please call your pharmacy.

## 2015-04-25 ENCOUNTER — Other Ambulatory Visit: Payer: Self-pay | Admitting: Cardiovascular Disease

## 2015-04-25 DIAGNOSIS — I739 Peripheral vascular disease, unspecified: Secondary | ICD-10-CM

## 2015-05-02 ENCOUNTER — Ambulatory Visit (HOSPITAL_COMMUNITY)
Admission: RE | Admit: 2015-05-02 | Discharge: 2015-05-02 | Disposition: A | Discharge status: Home / Self Care | Payer: Managed Care, Other (non HMO) | Source: Ambulatory Visit | Attending: Cardiovascular Disease | Admitting: Cardiovascular Disease

## 2015-05-02 ENCOUNTER — Ambulatory Visit (HOSPITAL_COMMUNITY)
Admission: RE | Admit: 2015-05-02 | Discharge: 2015-05-02 | Disposition: A | Discharge status: Home / Self Care | Payer: Managed Care, Other (non HMO) | Source: Ambulatory Visit | Attending: Cardiology | Admitting: Cardiology

## 2015-05-02 DIAGNOSIS — I10 Essential (primary) hypertension: Secondary | ICD-10-CM | POA: Diagnosis not present

## 2015-05-02 DIAGNOSIS — I739 Peripheral vascular disease, unspecified: Secondary | ICD-10-CM

## 2015-05-02 DIAGNOSIS — E7800 Pure hypercholesterolemia, unspecified: Secondary | ICD-10-CM | POA: Diagnosis not present

## 2015-05-02 DIAGNOSIS — I700 Atherosclerosis of aorta: Secondary | ICD-10-CM | POA: Diagnosis not present

## 2015-05-02 DIAGNOSIS — I708 Atherosclerosis of other arteries: Secondary | ICD-10-CM | POA: Insufficient documentation

## 2015-05-02 DIAGNOSIS — Z720 Tobacco use: Secondary | ICD-10-CM | POA: Insufficient documentation

## 2015-05-23 ENCOUNTER — Encounter: Payer: Self-pay | Admitting: Internal Medicine

## 2015-05-23 MED ORDER — CLOPIDOGREL BISULFATE 75 MG PO TABS: 75.0000 mg | ORAL_TABLET | Freq: Every day | ORAL | Status: DC

## 2015-05-31 ENCOUNTER — Other Ambulatory Visit: Payer: Self-pay | Admitting: *Deleted

## 2015-05-31 ENCOUNTER — Ambulatory Visit
Admission: RE | Admit: 2015-05-31 | Discharge: 2015-05-31 | Disposition: A | Discharge status: Home / Self Care | Payer: Managed Care, Other (non HMO) | Source: Ambulatory Visit | Attending: Cardiovascular Disease | Admitting: Cardiovascular Disease

## 2015-05-31 ENCOUNTER — Ambulatory Visit (INDEPENDENT_AMBULATORY_CARE_PROVIDER_SITE_OTHER): Payer: Managed Care, Other (non HMO) | Admitting: Cardiovascular Disease

## 2015-05-31 ENCOUNTER — Encounter: Payer: Self-pay | Admitting: Cardiovascular Disease

## 2015-05-31 ENCOUNTER — Encounter: Payer: Self-pay | Admitting: *Deleted

## 2015-05-31 VITALS — BP 104/70 | HR 66 | Ht 69.0 in | Wt 187.0 lb

## 2015-05-31 DIAGNOSIS — I739 Peripheral vascular disease, unspecified: Secondary | ICD-10-CM

## 2015-05-31 DIAGNOSIS — Z01818 Encounter for other preprocedural examination: Secondary | ICD-10-CM | POA: Diagnosis not present

## 2015-05-31 LAB — CBC WITH DIFFERENTIAL/PLATELET
Basophils Absolute: 0 cells/uL (ref 0–200)
Basophils Relative: 0 %
EOS ABS: 357 {cells}/uL (ref 15–500)
Eosinophils Relative: 3 %
HCT: 45.7 % (ref 38.5–50.0)
Hemoglobin: 15.9 g/dL (ref 13.2–17.1)
LYMPHS PCT: 31 %
Lymphs Abs: 3689 cells/uL (ref 850–3900)
MCH: 30 pg (ref 27.0–33.0)
MCHC: 34.8 g/dL (ref 32.0–36.0)
MCV: 86.2 fL (ref 80.0–100.0)
MONOS PCT: 11 %
MPV: 9.8 fL (ref 7.5–12.5)
Monocytes Absolute: 1309 cells/uL — ABNORMAL HIGH (ref 200–950)
NEUTROS PCT: 55 %
Neutro Abs: 6545 cells/uL (ref 1500–7800)
PLATELETS: 217 10*3/uL (ref 140–400)
RBC: 5.3 MIL/uL (ref 4.20–5.80)
RDW: 14.8 % (ref 11.0–15.0)
WBC: 11.9 10*3/uL — ABNORMAL HIGH (ref 3.8–10.8)

## 2015-05-31 LAB — BASIC METABOLIC PANEL
BUN: 13 mg/dL (ref 7–25)
CALCIUM: 8.5 mg/dL — AB (ref 8.6–10.3)
CO2: 25 mmol/L (ref 20–31)
CREATININE: 1.22 mg/dL (ref 0.60–1.35)
Chloride: 104 mmol/L (ref 98–110)
Glucose, Bld: 84 mg/dL (ref 65–99)
Potassium: 4.7 mmol/L (ref 3.5–5.3)
Sodium: 138 mmol/L (ref 135–146)

## 2015-05-31 LAB — PROTIME-INR
INR: 0.97 (ref <1.50)
PROTHROMBIN TIME: 13 s (ref 11.6–15.2)

## 2015-05-31 LAB — TSH: TSH: 1.9 mIU/L (ref 0.40–4.50)

## 2015-05-31 LAB — APTT: APTT: 35 s (ref 24–37)

## 2015-05-31 MED ORDER — VARENICLINE TARTRATE 0.5 MG X 11 & 1 MG X 42 PO MISC: ORAL | Status: DC

## 2015-05-31 MED ORDER — VARENICLINE TARTRATE 1 MG PO TABS: 1.0000 mg | ORAL_TABLET | Freq: Two times a day (BID) | ORAL | Status: DC

## 2015-05-31 NOTE — Patient Instructions (Addendum)
Medication Instructions:  Your physician recommends that you continue on your current medications as directed. Please refer to the Current Medication list given to you today.   Labwork: Your physician recommends that you return for lab work in: TODAY. The lab can be found on the FIRST FLOOR of out building in [address]    Testing/Procedures: Your physician has requested that you have a LEFT ILIAC STENT PLACEMENT. For further information please visit [email or web address] ON Jun 06, 2015.  Following your catheterization, you will not be allowed to drive for 3 days.  No lifting, pushing, or pulling greater that 10 pounds is allowed for 1 week.  You will be required to have the following tests prior to the procedure:  1. Blood work-the blood work can be done no more than 7 days prior to the procedure.  It can be done at any [facility] lab.  There is one downstairs on the first floor of this building and one in the [facility] building [phone]([address], [address]).  2. Chest Xray-the chest xray order has already been placed at the [facility].      Puncture site LEFT GROIN REP: TODD KIL    Any Other Special Instructions Will Be Listed Below (If Applicable).  Dr Allyson SabalBerry has given you a prescription for chantix. You can continue smoking for the first week on chantix. Then stop smoking on the day you chose on the following week and continue taking the medication as prescribed.   If you need a refill on your cardiac medications before your next appointment, please call your pharmacy.

## 2015-05-31 NOTE — Assessment & Plan Note (Signed)
History of peripheral arterial disease status post bilateral iliac stenting by Dr. Nadara EatonGangi 05/16/13. He had recurrent left leg claudication an Dopplers performed in our office 05/02/15 revealing a right ABI of 1.1 on the left of 0.78 with a high-frequency signal in the left external iliac artery. We will proceed with angiography and potential re-intervention for lifestyle limiting claudication.

## 2015-05-31 NOTE — Progress Notes (Signed)
05/31/2015 [name] [name]   06/28/1970  [id number]  Primary Physician Nicki ReaperBAITY, REGINA, NP Primary Cardiologist: Runell GessJonathan J. Iceis Knab MD Roseanne RenoFACP,FACC,FAHA, FSCAI   HPI:  [name] [name] is a pleasant 45 year old single Caucasian male father of one 45 year old son who is referred by Nicki Reaperegina Baity registered ,nurse practitioner, for peripheral vascular evaluation. I just saw him in the office 04/24/15. He has a history of acute tobacco abuse having smoked 40-60 pack years, treated hypertension and hyperlipidemia. She has never had a heart attack or stroke and denies chest pain or shortness of breath. He saw Dr. Nadara EatonGangi years ago and ultimately underwent angiography/ 05/16/13 revealing bilateral iliac disease both of which were stented with balloon expandable and self expanding stents. He enjoyed symptomatic relief for approximately one year and then developed recurrent left calf claudication. Recent Dopplers performed 05/02/15 revealed a left ABI 0.78 with a high-frequency signal in his left external iliac artery.   Current Outpatient Prescriptions  Medication Sig Dispense Refill  . aspirin EC 81 MG tablet Take 1 tablet (81 mg total) by mouth daily. 30 tablet 0  . atorvastatin (LIPITOR) 20 MG tablet Take 1 tablet (20 mg total) by mouth daily. 30 tablet 0  . clopidogrel (PLAVIX) 75 MG tablet Take 1 tablet (75 mg total) by mouth daily. 30 tablet 3  . ranitidine (ZANTAC) 150 MG tablet Take 0.5 tablets (75 mg total) by mouth daily as needed for heartburn. 15 tablet 0   No current facility-administered medications for this visit.    Allergies  Allergen Reactions  . Codeine     Migraine     Social History   Social History  . Marital Status: Single    Spouse Name: N/A  . Number of Children: N/A  . Years of Education: N/A   Occupational History  . Not on file.   Social History Main Topics  . Smoking status: Current Every Day Smoker -- 2.00 packs/day for 30 years  . Smokeless tobacco: Never Used  .  Alcohol Use: No  . Drug Use: No  . Sexual Activity: No   Other Topics Concern  . Not on file   Social History Narrative     Review of Systems: General: negative for chills, fever, night sweats or weight changes.  Cardiovascular: negative for chest pain, dyspnea on exertion, edema, orthopnea, palpitations, paroxysmal nocturnal dyspnea or shortness of breath Dermatological: negative for rash Respiratory: negative for cough or wheezing Urologic: negative for hematuria Abdominal: negative for nausea, vomiting, diarrhea, bright red blood per rectum, melena, or hematemesis Neurologic: negative for visual changes, syncope, or dizziness All other systems reviewed and are otherwise negative except as noted above.    Blood pressure 104/70, pulse 66, height 5\' 9"  (1.753 m), weight 187 lb (84.823 kg).  General appearance: alert and no distress Neck: no adenopathy, no carotid bruit, no JVD, supple, symmetrical, trachea midline and thyroid not enlarged, symmetric, no tenderness/mass/nodules Lungs: clear to auscultation bilaterally Heart: regular rate and rhythm, S1, S2 normal, no murmur, click, rub or gallop Extremities: extremities normal, atraumatic, no cyanosis or edema  EKG normal sinus rhythm at 67 without ST or T-wave changes. I personally reviewed this EKG  ASSESSMENT AND PLAN:   Claudication in peripheral vascular disease (HCC) History of peripheral arterial disease status post bilateral iliac stenting by Dr. Nadara EatonGangi 05/16/13. He had recurrent left leg claudication an Dopplers performed in our office 05/02/15 revealing a right ABI of 1.1 on the left of 0.78 with a high-frequency signal in  the left external iliac artery. We will proceed with angiography and potential re-intervention for lifestyle limiting claudication.      [name] FACP,FACC,FAHA, [facility] 05/31/2015 11:53 AM

## 2015-06-06 ENCOUNTER — Other Ambulatory Visit: Payer: Self-pay | Admitting: Nurse Practitioner

## 2015-06-06 ENCOUNTER — Encounter (HOSPITAL_COMMUNITY): Payer: Self-pay | Admitting: Cardiovascular Disease

## 2015-06-06 ENCOUNTER — Encounter (HOSPITAL_COMMUNITY)
Admission: RE | Disposition: A | Discharge status: Home / Self Care | Payer: Self-pay | Source: Ambulatory Visit | Attending: Cardiovascular Disease

## 2015-06-06 ENCOUNTER — Ambulatory Visit (HOSPITAL_COMMUNITY)
Admission: RE | Admit: 2015-06-06 | Discharge: 2015-06-07 | Disposition: A | Discharge status: Home / Self Care | Payer: Managed Care, Other (non HMO) | Source: Ambulatory Visit | Attending: Cardiovascular Disease | Admitting: Cardiovascular Disease

## 2015-06-06 DIAGNOSIS — Z7902 Long term (current) use of antithrombotics/antiplatelets: Secondary | ICD-10-CM | POA: Diagnosis not present

## 2015-06-06 DIAGNOSIS — K219 Gastro-esophageal reflux disease without esophagitis: Secondary | ICD-10-CM | POA: Diagnosis not present

## 2015-06-06 DIAGNOSIS — I70211 Atherosclerosis of native arteries of extremities with intermittent claudication, right leg: Secondary | ICD-10-CM | POA: Insufficient documentation

## 2015-06-06 DIAGNOSIS — Z79899 Other long term (current) drug therapy: Secondary | ICD-10-CM | POA: Insufficient documentation

## 2015-06-06 DIAGNOSIS — IMO0001 Reserved for inherently not codable concepts without codable children: Secondary | ICD-10-CM

## 2015-06-06 DIAGNOSIS — F17200 Nicotine dependence, unspecified, uncomplicated: Secondary | ICD-10-CM

## 2015-06-06 DIAGNOSIS — Z95820 Peripheral vascular angioplasty status with implants and grafts: Secondary | ICD-10-CM | POA: Diagnosis not present

## 2015-06-06 DIAGNOSIS — I10 Essential (primary) hypertension: Secondary | ICD-10-CM | POA: Insufficient documentation

## 2015-06-06 DIAGNOSIS — I739 Peripheral vascular disease, unspecified: Secondary | ICD-10-CM | POA: Diagnosis present

## 2015-06-06 DIAGNOSIS — Z7982 Long term (current) use of aspirin: Secondary | ICD-10-CM | POA: Insufficient documentation

## 2015-06-06 DIAGNOSIS — E785 Hyperlipidemia, unspecified: Secondary | ICD-10-CM | POA: Diagnosis not present

## 2015-06-06 DIAGNOSIS — I70212 Atherosclerosis of native arteries of extremities with intermittent claudication, left leg: Secondary | ICD-10-CM | POA: Diagnosis not present

## 2015-06-06 DIAGNOSIS — F17210 Nicotine dependence, cigarettes, uncomplicated: Secondary | ICD-10-CM | POA: Insufficient documentation

## 2015-06-06 HISTORY — DX: Gastro-esophageal reflux disease without esophagitis: K21.9

## 2015-06-06 HISTORY — PX: PERIPHERAL VASCULAR CATHETERIZATION: SHX172C

## 2015-06-06 HISTORY — DX: Essential (primary) hypertension: I10

## 2015-06-06 LAB — POCT ACTIVATED CLOTTING TIME
ACTIVATED CLOTTING TIME: 265 s
ACTIVATED CLOTTING TIME: 193 s
Activated Clotting Time: 173 seconds

## 2015-06-06 SURGERY — LOWER EXTREMITY ANGIOGRAPHY

## 2015-06-06 MED ORDER — MIDAZOLAM HCL 2 MG/2ML IJ SOLN
INTRAMUSCULAR | Status: DC | PRN
  Administered 2015-06-06: 1 mg via INTRAVENOUS

## 2015-06-06 MED ORDER — ATORVASTATIN CALCIUM 20 MG PO TABS
20.0000 mg | ORAL_TABLET | Freq: Every day | ORAL | Status: DC
Start: 2015-06-06 — End: 2015-06-07
  Administered 2015-06-06: 20 mg via ORAL
  Filled 2015-06-06 (×2): qty 1

## 2015-06-06 MED ORDER — IODIXANOL 320 MG/ML IV SOLN
INTRAVENOUS | Status: DC | PRN
  Administered 2015-06-06: 110 mL via INTRA_ARTERIAL

## 2015-06-06 MED ORDER — ASPIRIN EC 325 MG PO TBEC: 325.0000 mg | DELAYED_RELEASE_TABLET | Freq: Every day | ORAL | Status: DC

## 2015-06-06 MED ORDER — HYDRALAZINE HCL 20 MG/ML IJ SOLN: 10.0000 mg | INTRAMUSCULAR | Status: DC | PRN

## 2015-06-06 MED ORDER — HEPARIN SODIUM (PORCINE) 1000 UNIT/ML IJ SOLN
INTRAMUSCULAR | Status: DC | PRN
  Administered 2015-06-06: 6000 [IU] via INTRAVENOUS

## 2015-06-06 MED ORDER — HEPARIN (PORCINE) IN NACL 2-0.9 UNIT/ML-% IJ SOLN
INTRAMUSCULAR | Status: AC
  Filled 2015-06-06: qty 1000

## 2015-06-06 MED ORDER — FENTANYL CITRATE (PF) 100 MCG/2ML IJ SOLN
INTRAMUSCULAR | Status: DC | PRN
  Administered 2015-06-06: 25 ug via INTRAVENOUS
  Administered 2015-06-06: 50 ug via INTRAVENOUS

## 2015-06-06 MED ORDER — CLOPIDOGREL BISULFATE 75 MG PO TABS: 75.0000 mg | ORAL_TABLET | Freq: Every day | ORAL | Status: DC

## 2015-06-06 MED ORDER — ASPIRIN 81 MG PO CHEW
81.0000 mg | CHEWABLE_TABLET | ORAL | Status: AC
  Administered 2015-06-06: 81 mg via ORAL

## 2015-06-06 MED ORDER — ASPIRIN 81 MG PO CHEW
CHEWABLE_TABLET | ORAL | Status: AC
  Filled 2015-06-06: qty 1

## 2015-06-06 MED ORDER — SODIUM CHLORIDE 0.9 % IV SOLN: INTRAVENOUS | Status: AC

## 2015-06-06 MED ORDER — IOPAMIDOL (ISOVUE-370) INJECTION 76%
INTRAVENOUS | Status: AC
  Filled 2015-06-06: qty 100

## 2015-06-06 MED ORDER — SODIUM CHLORIDE 0.9 % WEIGHT BASED INFUSION
3.0000 mL/kg/h | INTRAVENOUS | Status: DC
  Administered 2015-06-06: 3 mL/kg/h via INTRAVENOUS

## 2015-06-06 MED ORDER — LIDOCAINE HCL (PF) 1 % IJ SOLN
INTRAMUSCULAR | Status: DC | PRN
  Administered 2015-06-06: 16 mL

## 2015-06-06 MED ORDER — ONDANSETRON HCL 4 MG/2ML IJ SOLN: 4.0000 mg | Freq: Four times a day (QID) | INTRAMUSCULAR | Status: DC | PRN

## 2015-06-06 MED ORDER — SODIUM CHLORIDE 0.9% FLUSH: 3.0000 mL | INTRAVENOUS | Status: DC | PRN

## 2015-06-06 MED ORDER — VARENICLINE TARTRATE 1 MG PO TABS
1.0000 mg | ORAL_TABLET | Freq: Two times a day (BID) | ORAL | Status: DC
  Administered 2015-06-06 – 2015-06-07 (×2): 1 mg via ORAL
  Filled 2015-06-06 (×3): qty 1

## 2015-06-06 MED ORDER — FENTANYL CITRATE (PF) 100 MCG/2ML IJ SOLN
INTRAMUSCULAR | Status: AC
  Filled 2015-06-06: qty 2

## 2015-06-06 MED ORDER — LIDOCAINE HCL (PF) 1 % IJ SOLN
INTRAMUSCULAR | Status: AC
  Filled 2015-06-06: qty 30

## 2015-06-06 MED ORDER — IOPAMIDOL (ISOVUE-370) INJECTION 76%
INTRAVENOUS | Status: AC
  Filled 2015-06-06: qty 50

## 2015-06-06 MED ORDER — HEPARIN (PORCINE) IN NACL 2-0.9 UNIT/ML-% IJ SOLN
INTRAMUSCULAR | Status: DC | PRN
  Administered 2015-06-06: 1000 mL via INTRA_ARTERIAL

## 2015-06-06 MED ORDER — SODIUM CHLORIDE 0.9 % WEIGHT BASED INFUSION: 1.0000 mL/kg/h | INTRAVENOUS | Status: DC

## 2015-06-06 MED ORDER — HEPARIN SODIUM (PORCINE) 1000 UNIT/ML IJ SOLN
INTRAMUSCULAR | Status: AC
  Filled 2015-06-06: qty 1

## 2015-06-06 MED ORDER — NAPROXEN SODIUM 275 MG PO TABS
440.0000 mg | ORAL_TABLET | Freq: Every day | ORAL | Status: DC
  Filled 2015-06-06 (×2): qty 2

## 2015-06-06 MED ORDER — ANGIOPLASTY BOOK
Freq: Once | Status: AC
  Administered 2015-06-06: 23:00:00
  Filled 2015-06-06: qty 1

## 2015-06-06 MED ORDER — ACETAMINOPHEN 325 MG PO TABS
650.0000 mg | ORAL_TABLET | ORAL | Status: DC | PRN
  Administered 2015-06-07: 650 mg via ORAL
  Filled 2015-06-06: qty 2

## 2015-06-06 MED ORDER — CLOPIDOGREL BISULFATE 75 MG PO TABS
75.0000 mg | ORAL_TABLET | Freq: Every day | ORAL | Status: DC
  Filled 2015-06-06: qty 1

## 2015-06-06 MED ORDER — MIDAZOLAM HCL 2 MG/2ML IJ SOLN
INTRAMUSCULAR | Status: AC
  Filled 2015-06-06: qty 2

## 2015-06-06 MED ORDER — ASPIRIN EC 81 MG PO TBEC: 81.0000 mg | DELAYED_RELEASE_TABLET | Freq: Every day | ORAL | Status: DC

## 2015-06-06 SURGICAL SUPPLY — 18 items
BALLN MUSTANG 7.0X20 75 (BALLOONS) ×3
BALLN MUSTANG 7.0X20 75CM (BALLOONS) ×1
BALLOON MUSTANG 4X20X75 (BALLOONS) ×4 IMPLANT
BALLOON MUSTANG 7.0X20 75 (BALLOONS) ×2 IMPLANT
CATH ANGIO 5F PIGTAIL 65CM (CATHETERS) ×4 IMPLANT
DRAPE SURG 17X11 SM STRL (DRAPES) ×4 IMPLANT
KIT ENCORE 26 ADVANTAGE (KITS) ×4 IMPLANT
KIT PV (KITS) ×4 IMPLANT
SHEATH BRITE TIP 6FR 35CM (SHEATH) ×4 IMPLANT
SHEATH PINNACLE 5F 10CM (SHEATH) ×4 IMPLANT
SHEATH PINNACLE 6F 10CM (SHEATH) ×4 IMPLANT
STENT ABSOLUTE 9X30X135 (Permanent Stent) ×4 IMPLANT
SYRINGE MEDRAD AVANTA MACH 7 (SYRINGE) ×4 IMPLANT
TAPE RADIOPAQUE TURBO (MISCELLANEOUS) ×4 IMPLANT
TRANSDUCER W/STOPCOCK (MISCELLANEOUS) ×4 IMPLANT
TRAY PV CATH (CUSTOM PROCEDURE TRAY) ×4 IMPLANT
WIRE HITORQ VERSACORE ST 145CM (WIRE) ×4 IMPLANT
WIRE VERSACORE LOC 115CM (WIRE) ×4 IMPLANT

## 2015-06-06 NOTE — Interval H&P Note (Signed)
History and Physical Interval Note:  06/06/2015 10:11 AM  [name] [name]  has presented today for surgery, with the diagnosis of pad  The various methods of treatment have been discussed with the patient and family. After consideration of risks, benefits and other options for treatment, the patient has consented to  Procedure(s): Lower Extremity Angiography (N/A) as a surgical intervention .  The patient's history has been reviewed, patient examined, no change in status, stable for surgery.  I have reviewed the patient's chart and labs.  Questions were answered to the patient's satisfaction.     Nanetta BattyBerry, Norely Schlick

## 2015-06-06 NOTE — H&P (View-Only) (Signed)
05/31/2015 [name] [name]   06/28/1970  [id number]  Primary Physician Nicki ReaperBAITY, REGINA, NP Primary Cardiologist: Runell GessJonathan J. Meilani Edmundson MD Roseanne RenoFACP,FACC,FAHA, FSCAI   HPI:  [name] is a pleasant 45 year old single Caucasian male father of one 45 year old son who is referred by Nicki Reaperegina Baity registered ,nurse practitioner, for peripheral vascular evaluation. I just saw him in the office 04/24/15. He has a history of acute tobacco abuse having smoked 40-60 pack years, treated hypertension and hyperlipidemia. She has never had a heart attack or stroke and denies chest pain or shortness of breath. He saw Dr. Nadara EatonGangi years ago and ultimately underwent angiography/ 05/16/13 revealing bilateral iliac disease both of which were stented with balloon expandable and self expanding stents. He enjoyed symptomatic relief for approximately one year and then developed recurrent left calf claudication. Recent Dopplers performed 05/02/15 revealed a left ABI 0.78 with a high-frequency signal in his left external iliac artery.   Current Outpatient Prescriptions  Medication Sig Dispense Refill  . aspirin EC 81 MG tablet Take 1 tablet (81 mg total) by mouth daily. 30 tablet 0  . atorvastatin (LIPITOR) 20 MG tablet Take 1 tablet (20 mg total) by mouth daily. 30 tablet 0  . clopidogrel (PLAVIX) 75 MG tablet Take 1 tablet (75 mg total) by mouth daily. 30 tablet 3  . ranitidine (ZANTAC) 150 MG tablet Take 0.5 tablets (75 mg total) by mouth daily as needed for heartburn. 15 tablet 0   No current facility-administered medications for this visit.    Allergies  Allergen Reactions  . Codeine     Migraine     Social History   Social History  . Marital Status: Single    Spouse Name: N/A  . Number of Children: N/A  . Years of Education: N/A   Occupational History  . Not on file.   Social History Main Topics  . Smoking status: Current Every Day Smoker -- 2.00 packs/day for 30 years  . Smokeless tobacco: Never Used  .  Alcohol Use: No  . Drug Use: No  . Sexual Activity: No   Other Topics Concern  . Not on file   Social History Narrative     Review of Systems: General: negative for chills, fever, night sweats or weight changes.  Cardiovascular: negative for chest pain, dyspnea on exertion, edema, orthopnea, palpitations, paroxysmal nocturnal dyspnea or shortness of breath Dermatological: negative for rash Respiratory: negative for cough or wheezing Urologic: negative for hematuria Abdominal: negative for nausea, vomiting, diarrhea, bright red blood per rectum, melena, or hematemesis Neurologic: negative for visual changes, syncope, or dizziness All other systems reviewed and are otherwise negative except as noted above.    Blood pressure 104/70, pulse 66, height 5\' 9"  (1.753 m), weight 187 lb (84.823 kg).  General appearance: alert and no distress Neck: no adenopathy, no carotid bruit, no JVD, supple, symmetrical, trachea midline and thyroid not enlarged, symmetric, no tenderness/mass/nodules Lungs: clear to auscultation bilaterally Heart: regular rate and rhythm, S1, S2 normal, no murmur, click, rub or gallop Extremities: extremities normal, atraumatic, no cyanosis or edema  EKG normal sinus rhythm at 67 without ST or T-wave changes. I personally reviewed this EKG  ASSESSMENT AND PLAN:   Claudication in peripheral vascular disease (HCC) History of peripheral arterial disease status post bilateral iliac stenting by Dr. Nadara EatonGangi 05/16/13. He had recurrent left leg claudication an Dopplers performed in our office 05/02/15 revealing a right ABI of 1.1 on the left of 0.78 with a high-frequency signal in  the left external iliac artery. We will proceed with angiography and potential re-intervention for lifestyle limiting claudication.      Asar Evilsizer J. Jerrick Farve MD FACP,FACC,FAHA, FSCAI 05/31/2015 11:53 AM  

## 2015-06-06 NOTE — Progress Notes (Signed)
Site area: LFA Site Prior to Removal:  Level 0 Pressure Applied For:2620min Manual: yes   Patient Status During Pull:  stable Post Pull Site:  Level 0 Post Pull Instructions Given: yes  Post Pull Pulses Present: palpable Dressing Applied:  tegaderm Bedrest begins @ 1330 till 1730 Comments:

## 2015-06-07 ENCOUNTER — Other Ambulatory Visit: Payer: Self-pay | Admitting: Nurse Practitioner

## 2015-06-07 ENCOUNTER — Encounter (HOSPITAL_COMMUNITY): Payer: Self-pay | Admitting: Nurse Practitioner

## 2015-06-07 DIAGNOSIS — E785 Hyperlipidemia, unspecified: Secondary | ICD-10-CM

## 2015-06-07 DIAGNOSIS — IMO0001 Reserved for inherently not codable concepts without codable children: Secondary | ICD-10-CM

## 2015-06-07 DIAGNOSIS — F17200 Nicotine dependence, unspecified, uncomplicated: Secondary | ICD-10-CM

## 2015-06-07 DIAGNOSIS — I739 Peripheral vascular disease, unspecified: Secondary | ICD-10-CM | POA: Diagnosis not present

## 2015-06-07 DIAGNOSIS — I70211 Atherosclerosis of native arteries of extremities with intermittent claudication, right leg: Secondary | ICD-10-CM | POA: Diagnosis not present

## 2015-06-07 DIAGNOSIS — Z720 Tobacco use: Secondary | ICD-10-CM

## 2015-06-07 LAB — BASIC METABOLIC PANEL
ANION GAP: 7 (ref 5–15)
BUN: 11 mg/dL (ref 6–20)
CO2: 27 mmol/L (ref 22–32)
Calcium: 8.7 mg/dL — ABNORMAL LOW (ref 8.9–10.3)
Chloride: 107 mmol/L (ref 101–111)
Creatinine, Ser: 1.22 mg/dL (ref 0.61–1.24)
GFR calc Af Amer: >60 mL/min (ref >60)
GLUCOSE: 155 mg/dL — AB (ref 65–99)
POTASSIUM: 4.1 mmol/L (ref 3.5–5.1)
Sodium: 141 mmol/L (ref 135–145)

## 2015-06-07 LAB — CBC
HEMATOCRIT: 44.3 % (ref 39.0–52.0)
HEMOGLOBIN: 14.4 g/dL (ref 13.0–17.0)
MCH: 28.7 pg (ref 26.0–34.0)
MCHC: 32.5 g/dL (ref 30.0–36.0)
MCV: 88.2 fL (ref 78.0–100.0)
Platelets: 203 10*3/uL (ref 150–400)
RBC: 5.02 MIL/uL (ref 4.22–5.81)
RDW: 14.4 % (ref 11.5–15.5)
WBC: 12.1 10*3/uL — ABNORMAL HIGH (ref 4.0–10.5)

## 2015-06-07 MED ORDER — ASPIRIN EC 81 MG PO TBEC: 81.0000 mg | DELAYED_RELEASE_TABLET | Freq: Every day | ORAL | Status: AC

## 2015-06-07 MED ORDER — NAPROXEN 250 MG PO TABS: 500.0000 mg | ORAL_TABLET | Freq: Every day | ORAL | Status: DC

## 2015-06-07 NOTE — Progress Notes (Signed)
Patient Name: [name] [name] Date of Encounter: 06/07/2015  Hospital Problem List     Principal Problem:   Claudication in peripheral vascular disease [facility](HCC) Active Problems:   Tobacco abuse   GERD (gastroesophageal reflux disease)   HLD (hyperlipidemia)    Subjective   Had some groin pain post procedure - better w/ tylenol.  Ambulating this AM.  Feels good.  No LLE claudication.  Inpatient Medications    . aspirin EC  325 mg Oral Daily  . atorvastatin  20 mg Oral Daily  . clopidogrel  75 mg Oral Q breakfast  . naproxen  500 mg Oral Daily  . varenicline  1 mg Oral BID    Vital Signs    Filed Vitals:   06/06/15 1700 06/06/15 1916 06/07/15 0244 06/07/15 0834  BP: 116/74 157/80 124/68 149/79  Pulse: 56 58 67 56  Temp:  97.2 F (36.2 C) 97 F (36.1 C) 97 F (36.1 C)  TempSrc:  Oral Oral Oral  Resp: 14 21 15 14   Height:      Weight:   185 lb 10 oz (84.2 kg)   SpO2: 95% 96% 96% 96%    Intake/Output Summary (Last 24 hours) at 06/07/15 0849 Last data filed at 06/07/15 0244  Gross per 24 hour  Intake 451.25 ml  Output    400 ml  Net  51.25 ml   Filed Weights   06/06/15 0848 06/07/15 0244  Weight: 186 lb (84.369 kg) 185 lb 10 oz (84.2 kg)    Physical Exam    General: Pleasant, NAD. Neuro: Alert and oriented X 3. Moves all extremities spontaneously. Psych: Normal affect. HEENT:  Normal  Neck: Supple without bruits or JVD. Lungs:  Resp regular and unlabored, CTA. Heart: RRR no s3, s4, or murmurs. Abdomen: Soft, non-tender, non-distended, BS + x 4.  Extremities: No clubbing, cyanosis or edema. DP/PT/Radials 1+ and equal bilaterally. L groin cath site w/o bleeding/bruit/hematoma.  Labs    CBC  Recent Labs  06/07/15 0634  WBC 12.1*  HGB 14.4  HCT 44.3  MCV 88.2  PLT 203   Basic Metabolic Panel  Recent Labs  06/07/15 0634  NA 141  K 4.1  CL 107  CO2 27  GLUCOSE 155*  BUN 11  CREATININE 1.22  CALCIUM 8.7*    Telemetry    Sinus  rhythm/sinus brady.  Radiology    Dg Chest 2 View  05/31/2015  CLINICAL DATA:  Preoperative testing. Scheduled for stent placement. EXAM: CHEST  2 VIEW COMPARISON:  12/29/2013 FINDINGS: Both lungs are clear. Heart and mediastinum are within normal limits. Trachea is midline. Bone structures are unremarkable. No pleural effusions. IMPRESSION: No active cardiopulmonary disease. Electronically Signed   By: Richarda OverlieAdam  Henn M.D.   On: 05/31/2015 16:25    Assessment & Plan    1.  LLE Claudication/PVD:  S/p peripheral angiography yesterday revealing severe stenosis @ the distal edge of the previously placed L CIA stent.  Now s/p PTA/Stenting of the LCIA.  Ambulating this AM w/o difficulty.  Plan d/c today with f/u arterial duplex next week and f/u with Dr. Allyson SabalBerry in 2-3 wks.  He will need to hold naproxen while on asa/plavix.  2.  HL:  On lipitor 20 @ home.  Last LDL in our system was 137.  Needs outpt f/u.  3.  Tobacco Abuse:  He says that he is committed to quitting.  Complete cessation advised.  Signed, Nicolasa Duckinghristopher Berge NP   I have seen  and examined the patient along with Nicolasa Ducking NP .  I have reviewed the chart, notes and new data.  I agree with NP's note.   PLAN:  S/P uncomplicated repeat stent L common iliac. Access site appears to be healthy. Follow up scheduled. DC home today. Insisted on importance of smoking cessation.  Thurmon Fair, MD, [facility] [phone] 06/07/2015, 9:08 AM

## 2015-06-07 NOTE — Discharge Instructions (Signed)

## 2015-06-07 NOTE — Discharge Summary (Signed)
Discharge Summary   Patient ID: [name] [name],  MRN: [id number], DOB/AGE: 45/02/1970 45 y.o.  Admit date: 06/06/2015 Discharge date: 06/07/2015  Primary Care Provider: Nicki ReaperBAITY, [name] Primary Cardiologist: [name] [name]   Discharge Diagnoses    Principal Problem:   Claudication in peripheral vascular disease (HCC)  **s/p PTA and stenting of the Right common Iliac Artery stenting this admissoin.  Active Problems:   Tobacco abuse   GERD (gastroesophageal reflux disease)   HLD (hyperlipidemia)   Allergies Allergies  Allergen Reactions  . Codeine Other (See Comments)    Migraine     Diagnostic Studies/Procedures    Peripheral Angiography & Percutaneous Transluminal Angioplasty & Stenting 5.18.2017  Angiographic Data:   1: Abdominal aorta-the distal dominant aorta was free of significant disease 2: Right lower extremity-there was 30-40% segmental stenosis at the  Ostium/proximal portion of the right common iliac artery 3: Left lower extremity-the left common and external iliac artery stents were widely patent. There was a new lesion at the distal edge of the previously placed left common iliac artery stent that was 95%.  **The left common iliac artery was successfully stented using a 9 x 30 mm Abbott nitinol Absolue Pro self-expanding stent. _____________   History of Present Illness  45 y/o ? with a h/o PAD, HTN, HL, and tobacco abuse.  He is s/p prior bilateral iliac stenting in 2015 and recently developed recurrent LLE claudication.  LLE ABI was found to be abnormal @ 0.78 and he was subsequently arranged for peripheral angiography.  Hospital Course   Consultants: None  [name] [name] underwent peripheral angiography on 5.17.2017 revealing a new 95% stenosis at the distal edge of the previously placed left common iliac artery stent.  This area was successfully treated with a 9 x 30 mm Abbott nitinol Absolue Pro self-expanding stent.  [name] [name] tolerated the procedure well and  post-procedure has been ambulating without recurrent claudication.  He will be discharged home today in good condition an will have follow-up arterial duplex on 5/31 with office follow-up a week later.  He has been counseled on the importance of smoking cessation and is committed to quitting. _____________  Discharge Vitals Blood pressure 149/79, pulse 56, temperature 97 F (36.1 C), temperature source Oral, resp. rate 14, height 5\' 9"  (1.753 m), weight 185 lb 10 oz (84.2 kg), SpO2 96 %.  Filed Weights   06/06/15 0848 06/07/15 0244  Weight: 186 lb (84.369 kg) 185 lb 10 oz (84.2 kg)    Labs     CBC  Recent Labs  06/07/15 0634  WBC 12.1*  HGB 14.4  HCT 44.3  MCV 88.2  PLT 203   Basic Metabolic Panel  Recent Labs  06/07/15 0634  NA 141  K 4.1  CL 107  CO2 27  GLUCOSE 155*  BUN 11  CREATININE 1.22  CALCIUM 8.7*    Disposition   Pt is being discharged home today in good condition.  Follow-up Plans & Appointments        Follow-up Information    Follow up with [name] [name] On 06/28/2015.   Specialties:  Cardiology, Radiology   Why:  8:15 am   Contact information:   [address] [place] [address] [phone]       [facility] On 06/19/2015.   Why:  12:30 PM   Contact information:   [address] [place] [address] [phone]  Discharge Medications     Medication List    STOP taking these medications        ALEVE 220 MG tablet  Generic drug:  naproxen sodium      TAKE these medications        aspirin EC 81 MG tablet  Take 1 tablet (81 mg total) by mouth daily.     atorvastatin 20 MG tablet  Commonly known as:  LIPITOR  Take 1 tablet (20 mg total) by mouth daily.     clopidogrel 75 MG tablet  Commonly known as:  PLAVIX  Take 1 tablet (75 mg total) by mouth daily.     ranitidine 150 MG tablet  Commonly known as:  ZANTAC  Take 0.5 tablets (75 mg  total) by mouth daily as needed for heartburn.     varenicline 1 MG tablet  Commonly known as:  CHANTIX CONTINUING MONTH PAK  Take 1 tablet (1 mg total) by mouth 2 (two) times daily.         Outstanding Labs/Studies   F/u Lower extremity arterial duplex as scheduled.  Duration of Discharge Encounter   Greater than 30 minutes including physician time.  Signed, Nicolasa Ducking NP 06/07/2015, 9:12 AM

## 2015-06-13 ENCOUNTER — Telehealth: Payer: Self-pay | Admitting: Cardiovascular Disease

## 2015-06-13 NOTE — Telephone Encounter (Signed)
[name] [name] is returning your call .[name]. Please call

## 2015-06-13 NOTE — Telephone Encounter (Signed)
Returned call to pt. 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 He says his groin has a small scab on it and is not tender. He states it is not bothering him. Pt has f/u [facility] on 5/31 and f/u with Berry on 06/28/15.  Will defer to Dr Allyson SabalBerry as to when pt can go back to his work.

## 2015-06-13 NOTE — Telephone Encounter (Signed)
Spoke with [name], NP who discharged pt from the hospital. He said that pt can return to work in 7 days considering his groin is healed and he is having no issues.  Returned call to pt and left a message to call back.

## 2015-06-13 NOTE — Telephone Encounter (Signed)
Pt calling to find out when he can go back to work after procedure

## 2015-06-14 NOTE — Telephone Encounter (Signed)
No answer. Left detailed message, ok per DPR, if instructions that he could go back to work on Tuesday, 06/18/15. Said to call back if he had any questions.

## 2015-06-14 NOTE — Telephone Encounter (Signed)
Can return to work on [employer]

## 2015-06-19 ENCOUNTER — Encounter: Payer: Self-pay | Admitting: *Deleted

## 2015-06-19 ENCOUNTER — Ambulatory Visit (HOSPITAL_COMMUNITY)
Admission: RE | Admit: 2015-06-19 | Discharge: 2015-06-19 | Disposition: A | Discharge status: Home / Self Care | Payer: Managed Care, Other (non HMO) | Source: Ambulatory Visit | Attending: Nurse Practitioner | Admitting: Nurse Practitioner

## 2015-06-19 ENCOUNTER — Telehealth: Payer: Self-pay | Admitting: Cardiovascular Disease

## 2015-06-19 DIAGNOSIS — E7800 Pure hypercholesterolemia, unspecified: Secondary | ICD-10-CM | POA: Insufficient documentation

## 2015-06-19 DIAGNOSIS — I700 Atherosclerosis of aorta: Secondary | ICD-10-CM | POA: Diagnosis not present

## 2015-06-19 DIAGNOSIS — Z720 Tobacco use: Secondary | ICD-10-CM | POA: Diagnosis not present

## 2015-06-19 DIAGNOSIS — I708 Atherosclerosis of other arteries: Secondary | ICD-10-CM | POA: Diagnosis not present

## 2015-06-19 DIAGNOSIS — I739 Peripheral vascular disease, unspecified: Secondary | ICD-10-CM

## 2015-06-19 DIAGNOSIS — K219 Gastro-esophageal reflux disease without esophagitis: Secondary | ICD-10-CM | POA: Diagnosis not present

## 2015-06-19 DIAGNOSIS — I10 Essential (primary) hypertension: Secondary | ICD-10-CM | POA: Insufficient documentation

## 2015-06-19 NOTE — Telephone Encounter (Signed)
Pt is calling to see if he can come by the office to pick up a doctor's note to return to work. He said he was told that he could return to work on 5/30 with restrictions of not lifting anything over 10lbs . Please f/u with him.

## 2015-06-19 NOTE — Telephone Encounter (Signed)
Returned call to patient.He stated he was presently in our lobby to pick up return to work letter.Spoke to Dr.Berry's nurse Victorino DikeJennifer she stated she gave letter to vascular tech to give to patient.

## 2015-06-28 ENCOUNTER — Ambulatory Visit (INDEPENDENT_AMBULATORY_CARE_PROVIDER_SITE_OTHER): Payer: Managed Care, Other (non HMO) | Admitting: Cardiovascular Disease

## 2015-06-28 ENCOUNTER — Encounter: Payer: Self-pay | Admitting: Cardiovascular Disease

## 2015-06-28 VITALS — BP 126/84 | HR 64 | Ht 69.0 in | Wt 189.4 lb

## 2015-06-28 DIAGNOSIS — E785 Hyperlipidemia, unspecified: Secondary | ICD-10-CM

## 2015-06-28 DIAGNOSIS — Z79899 Other long term (current) drug therapy: Secondary | ICD-10-CM | POA: Diagnosis not present

## 2015-06-28 DIAGNOSIS — I739 Peripheral vascular disease, unspecified: Secondary | ICD-10-CM

## 2015-06-28 DIAGNOSIS — F17200 Nicotine dependence, unspecified, uncomplicated: Secondary | ICD-10-CM

## 2015-06-28 DIAGNOSIS — F17290 Nicotine dependence, other tobacco product, uncomplicated: Secondary | ICD-10-CM

## 2015-06-28 MED ORDER — ATORVASTATIN CALCIUM 40 MG PO TABS: 40.0000 mg | ORAL_TABLET | Freq: Every day | ORAL | Status: DC

## 2015-06-28 NOTE — Progress Notes (Signed)
06/28/2015 [name] [name]   1970-02-19  [id number]  Primary Physician [name], NP Primary Cardiologist: [name] [name]  HPI:  [name] is a pleasant 45 year old single Caucasian male father of one 50 year old son who is referred by [name] registered ,nurse practitioner, for peripheral vascular evaluation. I just saw him in the office 05/31/15. He has a history of acute tobacco abuse having smoked 40-60 pack years, treated hypertension and hyperlipidemia. She has never had a heart attack or stroke and denies chest pain or shortness of breath. He saw [name] years ago and ultimately underwent angiography/ 05/16/13 revealing bilateral iliac disease both of which were stented with balloon expandable and self expanding stents. He enjoyed symptomatic relief for approximately one year and then developed recurrent left calf claudication. Recent Dopplers performed 05/02/15 revealed a left ABI 0.78 with a high-frequency signal in his left external iliac artery. I performed lower extremity angiography on him 06/06/15 revealing a patent left common and external iliac artery stent with a 95% stenosis at the distal edge of the proximal stent which I restented with a 9 mm x 3 cm nitinol self expanding stent. His symptoms of claudication have resolved and his Dopplers have normalized. He has in addition stop smoking by his account.  Current Outpatient Prescriptions  Medication Sig Dispense Refill  . aspirin EC 81 MG tablet Take 1 tablet (81 mg total) by mouth daily. 30 tablet 0  . clopidogrel (PLAVIX) 75 MG tablet Take 1 tablet (75 mg total) by mouth daily. 30 tablet 3  . ranitidine (ZANTAC) 150 MG tablet Take 0.5 tablets (75 mg total) by mouth daily as needed for heartburn. 15 tablet 0  . varenicline (CHANTIX CONTINUING MONTH PAK) 1 MG tablet Take 1 tablet (1 mg total) by mouth 2 (two) times daily. 60 tablet 3  . [DISCONTINUED] atorvastatin (LIPITOR) 20 MG tablet Take 1  tablet (20 mg total) by mouth daily. 30 tablet 0   No current facility-administered medications for this visit.    Allergies  Allergen Reactions  . Codeine Other (See Comments)    Migraine     Social History   Social History  . Marital Status: Divorced    Spouse Name: N/A  . Number of Children: N/A  . Years of Education: N/A   Occupational History  . Not on file.   Social History Main Topics  . Smoking status: Former Smoker -- 1.00 packs/day for 30 years    Types: Cigarettes    Quit date: 06/07/2015  . Smokeless tobacco: Former Neurosurgeon    Types: Snuff     Comment: "used snuff in my teens"  . Alcohol Use: 0.0 oz/week    0 Standard drinks or equivalent per week     Comment: 06/06/2015 "might have 1 beer/month"  . Drug Use: No  . Sexual Activity: Not Currently   Other Topics Concern  . Not on file   Social History Narrative     Review of Systems: General: negative for chills, fever, night sweats or weight changes.  Cardiovascular: negative for chest pain, dyspnea on exertion, edema, orthopnea, palpitations, paroxysmal nocturnal dyspnea or shortness of breath Dermatological: negative for rash Respiratory: negative for cough or wheezing Urologic: negative for hematuria Abdominal: negative for nausea, vomiting, diarrhea, bright red blood per rectum, melena, or hematemesis Neurologic: negative for visual changes, syncope, or dizziness All other systems reviewed and are otherwise negative except as noted above.    Blood pressure  126/84, pulse 64, height 5\' 9"  (1.753 m), weight 189 lb 6.4 oz (85.911 kg).  General appearance: alert and no distress Neck: no adenopathy, no carotid bruit, no JVD, supple, symmetrical, trachea midline and thyroid not enlarged, symmetric, no tenderness/mass/nodules Lungs: clear to auscultation bilaterally Heart: regular rate and rhythm, S1, S2 normal, no murmur, click, rub or gallop Extremities: extremities normal, atraumatic, no cyanosis or  edema  EKG not performed today  ASSESSMENT AND PLAN:   Nicotine dependence He patient stopped smoking at the time of his recent procedure  Claudication in peripheral vascular disease (HCC) History of peripheral arterial disease status post rior left common and external iliac artery stenting by Dr. Jacinto HalimGanji with recurrent symptoms and positive Dopplers.I restudied him 06/06/15 revealing a 95% stenosis at the distal edge of the proximal common iliac artery stent which I restented using a 9 mm x 3 cm Nitinol self-expanding stent. His follow-up Dopplers normalized and his symptoms resolved.He is on aspirin and Plavix.we will recheck lower extremity arterial Dopplers every 6 months. I will see him back in one year  HLD (hyperlipidemia) History of hyperlipidemia on statin therapy with recent lipid profile performed 03/15/15 revealed a total cholesterol of 219 with a triglyceride level of 285. His previous LDL was 137. I'm going to increase his atorvastatin from 20-40 mg a day. He does say that he has "a heart healthy diet". We will recheck a lipid and liver profile in 2 months.      Runell GessJonathan J. Markeesha Char MD FACP,FACC,FAHA, [facility] 06/28/2015 9:04 AM

## 2015-06-28 NOTE — Assessment & Plan Note (Signed)
History of peripheral arterial disease status post rior left common and external iliac artery stenting by Dr. Jacinto HalimGanji with recurrent symptoms and positive Dopplers.I restudied him 06/06/15 revealing a 95% stenosis at the distal edge of the proximal common iliac artery stent which I restented using a 9 mm x 3 cm Nitinol self-expanding stent. His follow-up Dopplers normalized and his symptoms resolved.He is on aspirin and Plavix.we will recheck lower extremity arterial Dopplers every 6 months. I will see him back in one year

## 2015-06-28 NOTE — Patient Instructions (Signed)
Medication Instructions:  Your physician has recommended you make the following change in your medication:  1- INCREASE atorvastatin to 40 mg (1 tablet) by mouth each evening. May take 2 tablets of 20 mg if you have some of the prescription left until you get the increased dose.   Labwork: Your physician recommends that you return for lab work IN 2 MONTHS- FASTING. AROUND August 28, 2015. The lab can be found on the FIRST FLOOR of out building in [address]   Testing/Procedures: Your physician has requested that you have a lower extremity arterial doppler- During this test, ultrasound is used to evaluate arterial blood flow in the legs. Allow approximately one hour for this exam.  IN 6 MONTHS AND EVERY 6 MONTHS AFTER THAT.   Follow-Up: Your physician wants you to follow-up in: 12 MONTHS WITH DR Allyson SabalBERRY.  You will receive a reminder letter in the mail two months in advance. If you don't receive a letter, please call our office to schedule the follow-up appointment.   Any Other Special Instructions Will Be Listed Below (If Applicable).     If you need a refill on your cardiac medications before your next appointment, please call your pharmacy.

## 2015-06-28 NOTE — Assessment & Plan Note (Signed)
History of hyperlipidemia on statin therapy with recent lipid profile performed 03/15/15 revealed a total cholesterol of 219 with a triglyceride level of 285. His previous LDL was 137. I'm going to increase his atorvastatin from 20-40 mg a day. He does say that he has "a heart healthy diet". We will recheck a lipid and liver profile in 2 months.

## 2015-06-28 NOTE — Assessment & Plan Note (Signed)
He patient stopped smoking at the time of his recent procedure

## 2015-08-15 ENCOUNTER — Other Ambulatory Visit: Payer: Self-pay | Admitting: Internal Medicine

## 2015-09-03 ENCOUNTER — Encounter (HOSPITAL_COMMUNITY): Payer: Self-pay | Admitting: *Deleted

## 2015-09-12 ENCOUNTER — Telehealth: Payer: Self-pay | Admitting: Internal Medicine

## 2015-09-12 ENCOUNTER — Ambulatory Visit: Payer: Managed Care, Other (non HMO) | Admitting: Internal Medicine

## 2015-09-12 DIAGNOSIS — Z0289 Encounter for other administrative examinations: Secondary | ICD-10-CM

## 2015-09-12 NOTE — Telephone Encounter (Signed)
Patient did not come in for their appointment today for 6 mo follow up  Please let me know if patient needs to be contacted immediately for follow up or no follow up needed. °

## 2015-09-13 NOTE — Telephone Encounter (Signed)
Yes, needs to reschedule

## 2015-09-13 NOTE — Telephone Encounter (Signed)
Called pt regarding missed appt, declined to rs appt

## 2015-12-31 ENCOUNTER — Other Ambulatory Visit: Payer: Self-pay | Admitting: Cardiovascular Disease

## 2015-12-31 DIAGNOSIS — I739 Peripheral vascular disease, unspecified: Secondary | ICD-10-CM

## 2016-01-08 ENCOUNTER — Inpatient Hospital Stay (HOSPITAL_COMMUNITY): Admission: RE | Admit: 2016-01-08 | Payer: Self-pay | Source: Ambulatory Visit

## 2017-04-08 ENCOUNTER — Other Ambulatory Visit: Payer: Self-pay

## 2017-04-08 ENCOUNTER — Emergency Department (HOSPITAL_COMMUNITY): Payer: Self-pay

## 2017-04-08 ENCOUNTER — Encounter (HOSPITAL_COMMUNITY): Payer: Self-pay

## 2017-04-08 ENCOUNTER — Inpatient Hospital Stay (HOSPITAL_COMMUNITY)
Admission: EM | Admit: 2017-04-08 | Discharge: 2017-04-19 | DRG: 234 | Disposition: A | Discharge status: Home / Self Care | Payer: Self-pay | Attending: Cardiothoracic Surgery | Admitting: Cardiothoracic Surgery

## 2017-04-08 DIAGNOSIS — Z7902 Long term (current) use of antithrombotics/antiplatelets: Secondary | ICD-10-CM

## 2017-04-08 DIAGNOSIS — I252 Old myocardial infarction: Secondary | ICD-10-CM

## 2017-04-08 DIAGNOSIS — Z808 Family history of malignant neoplasm of other organs or systems: Secondary | ICD-10-CM

## 2017-04-08 DIAGNOSIS — E785 Hyperlipidemia, unspecified: Secondary | ICD-10-CM

## 2017-04-08 DIAGNOSIS — R001 Bradycardia, unspecified: Secondary | ICD-10-CM | POA: Diagnosis not present

## 2017-04-08 DIAGNOSIS — K219 Gastro-esophageal reflux disease without esophagitis: Secondary | ICD-10-CM | POA: Diagnosis present

## 2017-04-08 DIAGNOSIS — D696 Thrombocytopenia, unspecified: Secondary | ICD-10-CM | POA: Diagnosis not present

## 2017-04-08 DIAGNOSIS — Z7982 Long term (current) use of aspirin: Secondary | ICD-10-CM

## 2017-04-08 DIAGNOSIS — M549 Dorsalgia, unspecified: Secondary | ICD-10-CM | POA: Diagnosis not present

## 2017-04-08 DIAGNOSIS — Y92410 Unspecified street and highway as the place of occurrence of the external cause: Secondary | ICD-10-CM

## 2017-04-08 DIAGNOSIS — T82855A Stenosis of coronary artery stent, initial encounter: Secondary | ICD-10-CM | POA: Diagnosis present

## 2017-04-08 DIAGNOSIS — Z8249 Family history of ischemic heart disease and other diseases of the circulatory system: Secondary | ICD-10-CM

## 2017-04-08 DIAGNOSIS — I739 Peripheral vascular disease, unspecified: Secondary | ICD-10-CM

## 2017-04-08 DIAGNOSIS — F17200 Nicotine dependence, unspecified, uncomplicated: Secondary | ICD-10-CM

## 2017-04-08 DIAGNOSIS — Z803 Family history of malignant neoplasm of breast: Secondary | ICD-10-CM

## 2017-04-08 DIAGNOSIS — E8770 Fluid overload, unspecified: Secondary | ICD-10-CM | POA: Diagnosis not present

## 2017-04-08 DIAGNOSIS — I25110 Atherosclerotic heart disease of native coronary artery with unstable angina pectoris: Secondary | ICD-10-CM | POA: Diagnosis present

## 2017-04-08 DIAGNOSIS — R079 Chest pain, unspecified: Secondary | ICD-10-CM | POA: Diagnosis present

## 2017-04-08 DIAGNOSIS — I214 Non-ST elevation (NSTEMI) myocardial infarction: Principal | ICD-10-CM

## 2017-04-08 DIAGNOSIS — Z951 Presence of aortocoronary bypass graft: Secondary | ICD-10-CM

## 2017-04-08 DIAGNOSIS — D62 Acute posthemorrhagic anemia: Secondary | ICD-10-CM | POA: Diagnosis not present

## 2017-04-08 DIAGNOSIS — J9811 Atelectasis: Secondary | ICD-10-CM | POA: Diagnosis not present

## 2017-04-08 DIAGNOSIS — Z8674 Personal history of sudden cardiac arrest: Secondary | ICD-10-CM

## 2017-04-08 DIAGNOSIS — Z87891 Personal history of nicotine dependence: Secondary | ICD-10-CM

## 2017-04-08 DIAGNOSIS — Z885 Allergy status to narcotic agent status: Secondary | ICD-10-CM

## 2017-04-08 DIAGNOSIS — I2510 Atherosclerotic heart disease of native coronary artery without angina pectoris: Secondary | ICD-10-CM

## 2017-04-08 DIAGNOSIS — I480 Paroxysmal atrial fibrillation: Secondary | ICD-10-CM | POA: Diagnosis not present

## 2017-04-08 DIAGNOSIS — Y831 Surgical operation with implant of artificial internal device as the cause of abnormal reaction of the patient, or of later complication, without mention of misadventure at the time of the procedure: Secondary | ICD-10-CM | POA: Diagnosis present

## 2017-04-08 DIAGNOSIS — I10 Essential (primary) hypertension: Secondary | ICD-10-CM

## 2017-04-08 DIAGNOSIS — IMO0001 Reserved for inherently not codable concepts without codable children: Secondary | ICD-10-CM

## 2017-04-08 DIAGNOSIS — R0789 Other chest pain: Secondary | ICD-10-CM

## 2017-04-08 DIAGNOSIS — Z720 Tobacco use: Secondary | ICD-10-CM

## 2017-04-08 HISTORY — DX: Atherosclerotic heart disease of native coronary artery without angina pectoris: I25.10

## 2017-04-08 LAB — BASIC METABOLIC PANEL
Anion gap: 10 (ref 5–15)
BUN: 7 mg/dL (ref 6–20)
CALCIUM: 8.9 mg/dL (ref 8.9–10.3)
CO2: 23 mmol/L (ref 22–32)
CREATININE: 1.07 mg/dL (ref 0.61–1.24)
Chloride: 104 mmol/L (ref 101–111)
GFR calc Af Amer: >60 mL/min (ref >60)
GFR calc non Af Amer: >60 mL/min (ref >60)
GLUCOSE: 103 mg/dL — AB (ref 65–99)
Potassium: 4.3 mmol/L (ref 3.5–5.1)
Sodium: 137 mmol/L (ref 135–145)

## 2017-04-08 LAB — CBC
HCT: 44.6 % (ref 39.0–52.0)
HEMATOCRIT: 42.6 % (ref 39.0–52.0)
HEMOGLOBIN: 14.8 g/dL (ref 13.0–17.0)
Hemoglobin: 15.2 g/dL (ref 13.0–17.0)
MCH: 29.6 pg (ref 26.0–34.0)
MCH: 30 pg (ref 26.0–34.0)
MCHC: 34.1 g/dL (ref 30.0–36.0)
MCHC: 34.7 g/dL (ref 30.0–36.0)
MCV: 86.9 fL (ref 78.0–100.0)
MCV: 86.4 fL (ref 78.0–100.0)
PLATELETS: 251 10*3/uL (ref 150–400)
Platelets: 241 10*3/uL (ref 150–400)
RBC: 5.13 MIL/uL (ref 4.22–5.81)
RBC: 4.93 MIL/uL (ref 4.22–5.81)
RDW: 14.5 % (ref 11.5–15.5)
RDW: 14.6 % (ref 11.5–15.5)
WBC: 13.6 10*3/uL — ABNORMAL HIGH (ref 4.0–10.5)
WBC: 11.4 10*3/uL — ABNORMAL HIGH (ref 4.0–10.5)

## 2017-04-08 LAB — CREATININE, SERUM: Creatinine, Ser: 1.14 mg/dL (ref 0.61–1.24)

## 2017-04-08 LAB — I-STAT TROPONIN, ED: TROPONIN I, POC: 0.11 ng/mL — AB (ref 0.00–0.08)

## 2017-04-08 LAB — TROPONIN I: Troponin I: 0.13 ng/mL (ref <0.03)

## 2017-04-08 MED ORDER — LOSARTAN POTASSIUM 25 MG PO TABS
25.0000 mg | ORAL_TABLET | Freq: Every day | ORAL | Status: DC
  Administered 2017-04-09: 25 mg via ORAL
  Filled 2017-04-08: qty 1

## 2017-04-08 MED ORDER — ASPIRIN EC 81 MG PO TBEC
81.0000 mg | DELAYED_RELEASE_TABLET | Freq: Every day | ORAL | Status: DC
  Administered 2017-04-09 – 2017-04-13 (×5): 81 mg via ORAL
  Filled 2017-04-08 (×5): qty 1

## 2017-04-08 MED ORDER — NITROGLYCERIN 0.4 MG SL SUBL: 0.4000 mg | SUBLINGUAL_TABLET | SUBLINGUAL | Status: DC | PRN

## 2017-04-08 MED ORDER — ACETAMINOPHEN 325 MG PO TABS: 650.0000 mg | ORAL_TABLET | ORAL | Status: DC | PRN

## 2017-04-08 MED ORDER — ASPIRIN 81 MG PO CHEW: 324.0000 mg | CHEWABLE_TABLET | ORAL | Status: AC

## 2017-04-08 MED ORDER — KETOROLAC TROMETHAMINE 30 MG/ML IJ SOLN
15.0000 mg | Freq: Once | INTRAMUSCULAR | Status: AC
  Administered 2017-04-08: 15 mg via INTRAMUSCULAR
  Filled 2017-04-08: qty 1

## 2017-04-08 MED ORDER — ATORVASTATIN CALCIUM 80 MG PO TABS
80.0000 mg | ORAL_TABLET | Freq: Every day | ORAL | Status: DC
  Administered 2017-04-08 – 2017-04-18 (×10): 80 mg via ORAL
  Filled 2017-04-08 (×10): qty 1

## 2017-04-08 MED ORDER — ONDANSETRON HCL 4 MG/2ML IJ SOLN: 4.0000 mg | Freq: Four times a day (QID) | INTRAMUSCULAR | Status: DC | PRN

## 2017-04-08 MED ORDER — ASPIRIN 300 MG RE SUPP: 300.0000 mg | RECTAL | Status: AC

## 2017-04-08 MED ORDER — SPIRONOLACTONE 25 MG PO TABS
25.0000 mg | ORAL_TABLET | Freq: Every day | ORAL | Status: DC
  Administered 2017-04-09 – 2017-04-13 (×5): 25 mg via ORAL
  Filled 2017-04-08 (×5): qty 1

## 2017-04-08 MED ORDER — CARVEDILOL PHOSPHATE ER 10 MG PO CP24
10.0000 mg | ORAL_CAPSULE | Freq: Every day | ORAL | Status: DC
  Filled 2017-04-08 (×2): qty 1

## 2017-04-08 MED ORDER — ASPIRIN 81 MG PO CHEW
324.0000 mg | CHEWABLE_TABLET | Freq: Once | ORAL | Status: AC
  Administered 2017-04-08: 324 mg via ORAL
  Filled 2017-04-08: qty 4

## 2017-04-08 MED ORDER — PRASUGREL HCL 10 MG PO TABS
10.0000 mg | ORAL_TABLET | Freq: Every day | ORAL | Status: DC
  Administered 2017-04-09: 10 mg via ORAL
  Filled 2017-04-08: qty 1

## 2017-04-08 MED ORDER — NICOTINE 14 MG/24HR TD PT24
14.0000 mg | MEDICATED_PATCH | Freq: Every day | TRANSDERMAL | Status: DC
Start: 2017-04-08 — End: 2017-04-14
  Filled 2017-04-08 (×4): qty 1

## 2017-04-08 MED ORDER — HEPARIN SODIUM (PORCINE) 5000 UNIT/ML IJ SOLN
5000.0000 [IU] | Freq: Three times a day (TID) | INTRAMUSCULAR | Status: DC
  Administered 2017-04-08 – 2017-04-09 (×2): 5000 [IU] via SUBCUTANEOUS
  Filled 2017-04-08 (×2): qty 1
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
## 2017-04-08 NOTE — Progress Notes (Signed)
Troponin 0.13, was 0.11 no s/s, MD notified and aware will continue to monitor, Thanks Lavonda JumboMike F RN.
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
## 2017-04-08 NOTE — ED Notes (Signed)
Cardiology @ bedside.

## 2017-04-08 NOTE — Progress Notes (Signed)
Pt is not able to financial afford his Coreg, will need financial help with this, has been out of med for 2 weeks, Thanks Lavonda JumboMike F RN.

## 2017-04-09 ENCOUNTER — Encounter (HOSPITAL_COMMUNITY): Payer: Self-pay | Admitting: Cardiovascular Disease

## 2017-04-09 ENCOUNTER — Encounter (HOSPITAL_COMMUNITY)
Admission: EM | Disposition: A | Discharge status: Home / Self Care | Payer: Self-pay | Source: Home / Self Care | Attending: Internal Medicine

## 2017-04-09 ENCOUNTER — Observation Stay (HOSPITAL_BASED_OUTPATIENT_CLINIC_OR_DEPARTMENT_OTHER): Payer: Self-pay

## 2017-04-09 ENCOUNTER — Other Ambulatory Visit: Payer: Self-pay | Admitting: *Deleted

## 2017-04-09 DIAGNOSIS — I2510 Atherosclerotic heart disease of native coronary artery without angina pectoris: Secondary | ICD-10-CM

## 2017-04-09 DIAGNOSIS — R072 Precordial pain: Secondary | ICD-10-CM

## 2017-04-09 HISTORY — PX: LEFT HEART CATH AND CORONARY ANGIOGRAPHY: CATH118249

## 2017-04-09 LAB — BASIC METABOLIC PANEL
ANION GAP: 11 (ref 5–15)
BUN: 10 mg/dL (ref 6–20)
CALCIUM: 8.7 mg/dL — AB (ref 8.9–10.3)
CO2: 23 mmol/L (ref 22–32)
Chloride: 106 mmol/L (ref 101–111)
Creatinine, Ser: 1.21 mg/dL (ref 0.61–1.24)
GFR calc Af Amer: >60 mL/min (ref >60)
Glucose, Bld: 107 mg/dL — ABNORMAL HIGH (ref 65–99)
POTASSIUM: 4.1 mmol/L (ref 3.5–5.1)
SODIUM: 140 mmol/L (ref 135–145)

## 2017-04-09 LAB — PROTIME-INR
INR: 1
PROTHROMBIN TIME: 13.1 s (ref 11.4–15.2)

## 2017-04-09 LAB — ECHOCARDIOGRAM COMPLETE: Weight: 2758.4 oz

## 2017-04-09 LAB — GLUCOSE, CAPILLARY
GLUCOSE-CAPILLARY: 105 mg/dL — AB (ref 65–99)
GLUCOSE-CAPILLARY: 90 mg/dL (ref 65–99)
GLUCOSE-CAPILLARY: 157 mg/dL — AB (ref 65–99)

## 2017-04-09 LAB — LIPID PANEL
CHOL/HDL RATIO: 4.5 ratio
CHOLESTEROL: 118 mg/dL (ref 0–200)
HDL: 26 mg/dL — ABNORMAL LOW (ref >40)
LDL Cholesterol: 62 mg/dL (ref 0–99)
Triglycerides: 150 mg/dL — ABNORMAL HIGH (ref <150)
VLDL: 30 mg/dL (ref 0–40)

## 2017-04-09 LAB — TROPONIN I
TROPONIN I: 0.05 ng/mL — AB (ref <0.03)
TROPONIN I: 0.07 ng/mL — AB (ref <0.03)

## 2017-04-09 LAB — HIV ANTIBODY (ROUTINE TESTING W REFLEX): HIV Screen 4th Generation wRfx: NONREACTIVE

## 2017-04-09 SURGERY — LEFT HEART CATH AND CORONARY ANGIOGRAPHY
Anesthesia: LOCAL

## 2017-04-09 MED ORDER — FENTANYL CITRATE (PF) 100 MCG/2ML IJ SOLN
INTRAMUSCULAR | Status: DC | PRN
  Administered 2017-04-09 (×2): 25 ug via INTRAVENOUS

## 2017-04-09 MED ORDER — MIDAZOLAM HCL 2 MG/2ML IJ SOLN
INTRAMUSCULAR | Status: DC | PRN
  Administered 2017-04-09 (×2): 1 mg via INTRAVENOUS

## 2017-04-09 MED ORDER — SODIUM CHLORIDE 0.9 % WEIGHT BASED INFUSION: 1.0000 mL/kg/h | INTRAVENOUS | Status: DC

## 2017-04-09 MED ORDER — SODIUM CHLORIDE 0.9 % IV SOLN
250.0000 mL | INTRAVENOUS | Status: DC | PRN
  Administered 2017-04-14: 12:00:00 via INTRAVENOUS

## 2017-04-09 MED ORDER — LOSARTAN POTASSIUM 25 MG PO TABS
12.5000 mg | ORAL_TABLET | Freq: Every day | ORAL | Status: DC
  Administered 2017-04-10 – 2017-04-13 (×4): 12.5 mg via ORAL
  Filled 2017-04-09 (×4): qty 1

## 2017-04-09 MED ORDER — SODIUM CHLORIDE 0.9% FLUSH: 3.0000 mL | INTRAVENOUS | Status: DC | PRN

## 2017-04-09 MED ORDER — ASPIRIN 81 MG PO CHEW: 81.0000 mg | CHEWABLE_TABLET | ORAL | Status: DC

## 2017-04-09 MED ORDER — MIDAZOLAM HCL 2 MG/2ML IJ SOLN
INTRAMUSCULAR | Status: AC
  Filled 2017-04-09: qty 2

## 2017-04-09 MED ORDER — SODIUM CHLORIDE 0.9 % WEIGHT BASED INFUSION: 3.0000 mL/kg/h | INTRAVENOUS | Status: DC

## 2017-04-09 MED ORDER — CARVEDILOL 3.125 MG PO TABS
3.1250 mg | ORAL_TABLET | Freq: Two times a day (BID) | ORAL | Status: DC
  Administered 2017-04-09 – 2017-04-13 (×9): 3.125 mg via ORAL
  Filled 2017-04-09 (×10): qty 1

## 2017-04-09 MED ORDER — IOPAMIDOL (ISOVUE-370) INJECTION 76%
INTRAVENOUS | Status: DC | PRN
  Administered 2017-04-09: 25 mL via INTRA_ARTERIAL

## 2017-04-09 MED ORDER — HEPARIN (PORCINE) IN NACL 2-0.9 UNIT/ML-% IJ SOLN
INTRAMUSCULAR | Status: AC | PRN
  Administered 2017-04-09 (×2): 500 mL via INTRA_ARTERIAL

## 2017-04-09 MED ORDER — VERAPAMIL HCL 2.5 MG/ML IV SOLN
INTRAVENOUS | Status: AC
  Filled 2017-04-09: qty 2

## 2017-04-09 MED ORDER — HEPARIN (PORCINE) IN NACL 2-0.9 UNIT/ML-% IJ SOLN
INTRAMUSCULAR | Status: AC
  Filled 2017-04-09: qty 1000

## 2017-04-09 MED ORDER — LIDOCAINE HCL (PF) 1 % IJ SOLN
INTRAMUSCULAR | Status: DC | PRN
  Administered 2017-04-09: 23 mL

## 2017-04-09 MED ORDER — LIDOCAINE HCL 1 % IJ SOLN
INTRAMUSCULAR | Status: AC
  Filled 2017-04-09: qty 20

## 2017-04-09 MED ORDER — SODIUM CHLORIDE 0.9 % WEIGHT BASED INFUSION: 1.0000 mL/kg/h | INTRAVENOUS | Status: AC

## 2017-04-09 MED ORDER — SODIUM CHLORIDE 0.9% FLUSH
3.0000 mL | Freq: Two times a day (BID) | INTRAVENOUS | Status: DC
  Administered 2017-04-10 – 2017-04-12 (×2): 3 mL via INTRAVENOUS

## 2017-04-09 MED ORDER — SODIUM CHLORIDE 0.9 % IV SOLN: 250.0000 mL | INTRAVENOUS | Status: DC | PRN

## 2017-04-09 MED ORDER — FENTANYL CITRATE (PF) 100 MCG/2ML IJ SOLN
INTRAMUSCULAR | Status: AC
  Filled 2017-04-09: qty 2

## 2017-04-09 MED ORDER — SODIUM CHLORIDE 0.9 % WEIGHT BASED INFUSION
3.0000 mL/kg/h | INTRAVENOUS | Status: AC
  Administered 2017-04-09: 3 mL/kg/h via INTRAVENOUS

## 2017-04-09 MED ORDER — HEPARIN (PORCINE) IN NACL 100-0.45 UNIT/ML-% IJ SOLN
1150.0000 [IU]/h | INTRAMUSCULAR | Status: DC
  Administered 2017-04-09 – 2017-04-13 (×4): 1150 [IU]/h via INTRAVENOUS
  Filled 2017-04-09 (×5): qty 250

## 2017-04-09 MED ORDER — IOPAMIDOL (ISOVUE-370) INJECTION 76%
INTRAVENOUS | Status: AC
  Filled 2017-04-09: qty 100

## 2017-04-09 MED ORDER — SODIUM CHLORIDE 0.9% FLUSH: 3.0000 mL | Freq: Two times a day (BID) | INTRAVENOUS | Status: DC

## 2017-04-09 SURGICAL SUPPLY — 13 items
CATH INFINITI 5FR JL4 (CATHETERS) ×2 IMPLANT
CATH INFINITI JR4 5F (CATHETERS) ×2 IMPLANT
COVER PRB 48X5XTLSCP FOLD TPE (BAG) ×1 IMPLANT
COVER PROBE 5X48 (BAG) ×1
KIT HEART LEFT (KITS) ×2 IMPLANT
NEEDLE PERC 21GX4CM (NEEDLE) ×2 IMPLANT
PACK CARDIAC CATHETERIZATION (CUSTOM PROCEDURE TRAY) ×2 IMPLANT
SHEATH AVANTI 11CM 5FR (SHEATH) ×2 IMPLANT
SHEATH AVANTI 11CM 6FR (SHEATH) IMPLANT
SHEATH RAIN RADIAL 21G 6FR (SHEATH) ×2 IMPLANT
TRANSDUCER W/STOPCOCK (MISCELLANEOUS) ×2 IMPLANT
TUBING CIL FLEX 10 FLL-RA (TUBING) ×2 IMPLANT
WIRE EMERALD 3MM-J .035X150CM (WIRE) ×2 IMPLANT

## 2017-04-09 NOTE — Plan of Care (Signed)
  Problem: Education: Goal: Knowledge of General Education information will improve Outcome: Progressing   Problem: Education: Goal: Understanding of cardiac disease, CV risk reduction, and recovery process will improve Outcome: Progressing   Problem: Activity: Goal: Ability to tolerate increased activity will improve Outcome: Progressing   Problem: Cardiac: Goal: Ability to achieve and maintain adequate cardiovascular perfusion will improve Outcome: Progressing   Problem: Health Behavior/Discharge Planning: Goal: Ability to safely manage health-related needs after discharge will improve Outcome: Progressing

## 2017-04-09 NOTE — Progress Notes (Signed)
  Echocardiogram 2D Echocardiogram has been performed.  Janalyn HarderWest, Angelys Yetman R 04/09/2017, 12:10 PM

## 2017-04-09 NOTE — Progress Notes (Signed)
1405 Patient received post Ca Cath. Right Femoral site level 0, dressing CDI.  Patient voided in the urinal and after voiding  noted that Cath site starting to swell, applied pressure on the site.  Called Ca Cath, 3 RNs came to check patient and applied pressure. Dr. Excell Seltzerooper came to talk to patient and family at bedside. Will monitor accordingly.
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
## 2017-04-09 NOTE — Progress Notes (Signed)
ANTICOAGULATION CONSULT NOTE - Follow Up Consult  Pharmacy Consult for Heparin Indication: chest pain/ACS  Allergies  Allergen Reactions  . Codeine Other (See Comments)    Migraine     Patient Measurements: Height: 5\' 9"  (175.3 cm) Weight: 171 lb 15.3 oz (78 kg) IBW/kg (Calculated) : 70.7  Vital Signs: Temp: 98.2 F (36.8 C) (03/22 1230) Temp Source: Oral (03/22 1230) BP: 144/75 (03/22 1405) Pulse Rate: 54 (03/22 1405)  Labs: Recent Labs    04/08/17 1206 04/08/17 1743 04/08/17 2338 04/09/17 0606 04/09/17 1059  HGB 15.2 14.8  --   --   --   HCT 44.6 42.6  --   --   --   PLT 251 241  --   --   --   LABPROT  --   --   --   --  13.1  INR  --   --   --   --  1.00  CREATININE 1.07 1.14  --  1.21  --   TROPONINI  --  0.13* 0.07* 0.05*  --     Estimated Creatinine Clearance: 76.3 mL/min (by C-G formula based on SCr of 1.21 mg/dL).  Assessment: 47 year old male s/p cath with plans for evaluation for CABG Heparin to restart 8 hours post sheath removal  Goal of Therapy:  Heparin level 0.3-0.7 units/ml Monitor platelets by anticoagulation protocol: Yes   Plan:  Heparin at 1150 units / hr to start at 10 pm Daily heparin level, CBC  Thank you Okey RegalLisa Walter Grima, PharmD [phone]  04/09/2017,2:34 PM

## 2017-04-09 NOTE — Interval H&P Note (Signed)
History and Physical Interval Note:  04/09/2017 1:15 PM  [name] [name]  has presented today for surgery, with the diagnosis of Chest Pain  The various methods of treatment have been discussed with the patient and family. After consideration of risks, benefits and other options for treatment, the patient has consented to  Procedure(s): LEFT HEART CATH AND CORONARY ANGIOGRAPHY (N/A) as a surgical intervention .  The patient's history has been reviewed, patient examined, no change in status, stable for surgery.  I have reviewed the patient's chart and labs.  Questions were answered to the patient's satisfaction.     Tonny BollmanMichael Radwan Cowley

## 2017-04-09 NOTE — Progress Notes (Signed)
Patient refused to take  25mg  of Losartan.Given  And took   only 12.5mg . Carvedilol dose not given, HR 50, Cards NP made aware.
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
## 2017-04-10 DIAGNOSIS — I25110 Atherosclerotic heart disease of native coronary artery with unstable angina pectoris: Secondary | ICD-10-CM

## 2017-04-10 DIAGNOSIS — I10 Essential (primary) hypertension: Secondary | ICD-10-CM

## 2017-04-10 DIAGNOSIS — I200 Unstable angina: Secondary | ICD-10-CM

## 2017-04-10 DIAGNOSIS — F17200 Nicotine dependence, unspecified, uncomplicated: Secondary | ICD-10-CM

## 2017-04-10 LAB — BASIC METABOLIC PANEL
Anion gap: 11 (ref 5–15)
BUN: 12 mg/dL (ref 6–20)
CHLORIDE: 104 mmol/L (ref 101–111)
CO2: 25 mmol/L (ref 22–32)
CREATININE: 1.12 mg/dL (ref 0.61–1.24)
Calcium: 8.1 mg/dL — ABNORMAL LOW (ref 8.9–10.3)
GFR calc non Af Amer: >60 mL/min (ref >60)
Glucose, Bld: 104 mg/dL — ABNORMAL HIGH (ref 65–99)
Potassium: 4 mmol/L (ref 3.5–5.1)
SODIUM: 140 mmol/L (ref 135–145)

## 2017-04-10 LAB — CBC
HCT: 39 % (ref 39.0–52.0)
HEMOGLOBIN: 12.7 g/dL — AB (ref 13.0–17.0)
MCH: 28.7 pg (ref 26.0–34.0)
MCHC: 32.6 g/dL (ref 30.0–36.0)
MCV: 88.2 fL (ref 78.0–100.0)
Platelets: 225 10*3/uL (ref 150–400)
RBC: 4.42 MIL/uL (ref 4.22–5.81)
RDW: 14.8 % (ref 11.5–15.5)
WBC: 11.8 10*3/uL — ABNORMAL HIGH (ref 4.0–10.5)

## 2017-04-10 LAB — HEPARIN LEVEL (UNFRACTIONATED)
HEPARIN UNFRACTIONATED: 0.2 [IU]/mL — AB (ref 0.30–0.70)
HEPARIN UNFRACTIONATED: 0.45 [IU]/mL (ref 0.30–0.70)
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
## 2017-04-10 NOTE — Progress Notes (Signed)
CARDIAC REHAB PHASE I   PRE:  Rate/Rhythm: 67 SR  BP:  Supine:   Sitting:   Standing: 106/61   SaO2: 98% RA  MODE:  Ambulation: 470 ft   POST:  Rate/Rhythm: 69  BP:  Supine:   Sitting: 123/68  Standing:    SaO2: 100% RA  0952-1010 Patient tolerated ambulation well with assist x1. Gait steady, patient denies any symptoms, VSS. To bedside after walk, IV intact.  Artist Paislinty M Aleese Kamps, MS, ACSM CEP

## 2017-04-10 NOTE — Progress Notes (Signed)
ANTICOAGULATION CONSULT NOTE - Follow Up Consult  Pharmacy Consult for Heparin Indication: chest pain/ACS  Patient Measurements: Height: 5\' 9"  (175.3 cm) Weight: 175 lb 14.8 oz (79.8 kg) IBW/kg (Calculated) : 70.7    Labs: Recent Labs    04/08/17 1206 04/08/17 1743 04/08/17 2338 04/09/17 0606 04/09/17 1059 04/10/17 0252 04/10/17 0850  HGB 15.2 14.8  --   --   --  12.7*  --   HCT 44.6 42.6  --   --   --  39.0  --   PLT 251 241  --   --   --  225  --   LABPROT  --   --   --   --  13.1  --   --   INR  --   --   --   --  1.00  --   --   HEPARINUNFRC  --   --   --   --   --  0.20* 0.45  CREATININE 1.07 1.14  --  1.21  --  1.12  --   TROPONINI  --  0.13* 0.07* 0.05*  --   --   --    Estimated Creatinine Clearance: 82.4 mL/min (by C-G formula based on SCr of 1.12 mg/dL).  Assessment: 47 year old male s/p cath with plans for evaluation for CABG. Heparin was restarted 8 hours post-sheath removal. Slight drop in Hgb, PLTs stable, no current bleeding noted. Heparin level now therapeutic at 0.45 post re-start.   Goal of Therapy:  Heparin level 0.3-0.7 units/ml Monitor platelets by anticoagulation protocol: Yes   Plan:  Continue heparin at 1150 units/hr Daily HL, monitor CBC and for s/sx of bleeding   Nolen MuAustin J Lucas PharmD PGY1 Pharmacy Practice Resident 04/10/2017 9:46 AM Pager: [phone] Phone: [phone]
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
## 2017-04-10 NOTE — Progress Notes (Signed)
ANTICOAGULATION CONSULT NOTE - Follow Up Consult  Pharmacy Consult for heparin Indication: NSTEMI  Labs: Recent Labs    04/08/17 1206 04/08/17 1743 04/08/17 2338 04/09/17 0606 04/09/17 1059 04/10/17 0252  HGB 15.2 14.8  --   --   --  12.7*  HCT 44.6 42.6  --   --   --  39.0  PLT 251 241  --   --   --  225  LABPROT  --   --   --   --  13.1  --   INR  --   --   --   --  1.00  --   HEPARINUNFRC  --   --   --   --   --  0.20*  CREATININE 1.07 1.14  --  1.21  --   --   TROPONINI  --  0.13* 0.07* 0.05*  --   --     Assessment/Plan:  47yo male subtherapeutic on heparin with initial dosing post-cath though lab was drawn just 3h after gtt started. Will continue gtt at current rate for now and check additional level.   Vernard GamblesVeronda Azell Bill, PharmD, BCPS  04/10/2017,4:37 AM

## 2017-04-11 LAB — COMPREHENSIVE METABOLIC PANEL
ALBUMIN: 3.4 g/dL — AB (ref 3.5–5.0)
ALK PHOS: 91 U/L (ref 38–126)
ALT: 20 U/L (ref 17–63)
ANION GAP: 8 (ref 5–15)
AST: 63 U/L — ABNORMAL HIGH (ref 15–41)
BUN: 9 mg/dL (ref 6–20)
CALCIUM: 8.7 mg/dL — AB (ref 8.9–10.3)
CHLORIDE: 107 mmol/L (ref 101–111)
CO2: 25 mmol/L (ref 22–32)
Creatinine, Ser: 1.15 mg/dL (ref 0.61–1.24)
GFR calc non Af Amer: >60 mL/min (ref >60)
GLUCOSE: 106 mg/dL — AB (ref 65–99)
POTASSIUM: 4 mmol/L (ref 3.5–5.1)
SODIUM: 140 mmol/L (ref 135–145)
Total Bilirubin: 0.8 mg/dL (ref 0.3–1.2)
Total Protein: 6.4 g/dL — ABNORMAL LOW (ref 6.5–8.1)

## 2017-04-11 LAB — CBC
HCT: 40.9 % (ref 39.0–52.0)
Hemoglobin: 13.5 g/dL (ref 13.0–17.0)
MCH: 29.2 pg (ref 26.0–34.0)
MCHC: 33 g/dL (ref 30.0–36.0)
MCV: 88.3 fL (ref 78.0–100.0)
PLATELETS: 209 10*3/uL (ref 150–400)
RBC: 4.63 MIL/uL (ref 4.22–5.81)
RDW: 14.7 % (ref 11.5–15.5)
WBC: 11.2 10*3/uL — ABNORMAL HIGH (ref 4.0–10.5)

## 2017-04-11 LAB — LIPID PANEL
CHOL/HDL RATIO: 4.1 ratio
CHOLESTEROL: 126 mg/dL (ref 0–200)
HDL: 31 mg/dL — ABNORMAL LOW (ref >40)
LDL Cholesterol: 62 mg/dL (ref 0–99)
TRIGLYCERIDES: 167 mg/dL — AB (ref <150)
VLDL: 33 mg/dL (ref 0–40)

## 2017-04-11 LAB — HEPARIN LEVEL (UNFRACTIONATED): HEPARIN UNFRACTIONATED: 0.44 [IU]/mL (ref 0.30–0.70)

## 2017-04-11 LAB — HEMOGLOBIN A1C
Hgb A1c MFr Bld: 6.1 % — ABNORMAL HIGH (ref 4.8–5.6)
MEAN PLASMA GLUCOSE: 128.37 mg/dL

## 2017-04-11 MED ORDER — FAMOTIDINE 20 MG PO TABS
10.0000 mg | ORAL_TABLET | Freq: Every day | ORAL | Status: DC
  Administered 2017-04-11: 10 mg via ORAL
  Filled 2017-04-11 (×2): qty 1
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
## 2017-04-11 NOTE — Progress Notes (Signed)
ANTICOAGULATION CONSULT NOTE - Follow Up Consult  Pharmacy Consult for Heparin Indication: chest pain/ACS  Patient Measurements: Height: 5\' 9"  (175.3 cm) Weight: 174 lb 1.6 oz (79 kg) IBW/kg (Calculated) : 70.7    Labs: Recent Labs    04/08/17 1743 04/08/17 2338 04/09/17 0606 04/09/17 1059 04/10/17 0252 04/10/17 0850 04/11/17 0758  HGB 14.8  --   --   --  12.7*  --  13.5  HCT 42.6  --   --   --  39.0  --  40.9  PLT 241  --   --   --  225  --  209  LABPROT  --   --   --  13.1  --   --   --   INR  --   --   --  1.00  --   --   --   HEPARINUNFRC  --   --   --   --  0.20* 0.45 0.44  CREATININE 1.14  --  1.21  --  1.12  --  1.15  TROPONINI 0.13* 0.07* 0.05*  --   --   --   --    Estimated Creatinine Clearance: 80.3 mL/min (by C-G formula based on SCr of 1.15 mg/dL).  Assessment: 47 year old male s/p cath with plans for evaluation for CABG.   Heparin level is therapeutic at 0.44 on 1150 units/hr. Hgb is stable at 13.5, plts 209. No signs/symptoms of bleeding. No infusion issues noted.   Goal of Therapy:  Heparin level 0.3-0.7 units/ml Monitor platelets by anticoagulation protocol: Yes   Plan:  Continue heparin at 1150 units/hr Daily HL, monitor CBC and for s/sx of bleeding Follow up if plans for surgery  Girard CooterKimberly Perkins, PharmD Clinical Pharmacist  Pager: [phone] Clinical Phone for 04/11/2017 until 3:30pm: [phone] If after 3:30pm, please call main pharmacy at [phone]

## 2017-04-12 DIAGNOSIS — I25110 Atherosclerotic heart disease of native coronary artery with unstable angina pectoris: Secondary | ICD-10-CM

## 2017-04-12 DIAGNOSIS — I25111 Atherosclerotic heart disease of native coronary artery with angina pectoris with documented spasm: Secondary | ICD-10-CM

## 2017-04-12 LAB — CBC
HCT: 41.5 % (ref 39.0–52.0)
Hemoglobin: 13.7 g/dL (ref 13.0–17.0)
MCH: 28.8 pg (ref 26.0–34.0)
MCHC: 33 g/dL (ref 30.0–36.0)
MCV: 87.2 fL (ref 78.0–100.0)
PLATELETS: 235 10*3/uL (ref 150–400)
RBC: 4.76 MIL/uL (ref 4.22–5.81)
RDW: 14.3 % (ref 11.5–15.5)
WBC: 11.2 10*3/uL — ABNORMAL HIGH (ref 4.0–10.5)

## 2017-04-12 LAB — BASIC METABOLIC PANEL
Anion gap: 10 (ref 5–15)
BUN: 10 mg/dL (ref 6–20)
CHLORIDE: 106 mmol/L (ref 101–111)
CO2: 24 mmol/L (ref 22–32)
CREATININE: 1.18 mg/dL (ref 0.61–1.24)
Calcium: 8.4 mg/dL — ABNORMAL LOW (ref 8.9–10.3)
GFR calc Af Amer: >60 mL/min (ref >60)
GFR calc non Af Amer: >60 mL/min (ref >60)
GLUCOSE: 110 mg/dL — AB (ref 65–99)
Potassium: 3.8 mmol/L (ref 3.5–5.1)
Sodium: 140 mmol/L (ref 135–145)

## 2017-04-12 LAB — HEPARIN LEVEL (UNFRACTIONATED): Heparin Unfractionated: 0.43 IU/mL (ref 0.30–0.70)

## 2017-04-12 LAB — PLATELET INHIBITION P2Y12: Platelet Function  P2Y12: 128 [PRU] — ABNORMAL LOW (ref 194–418)

## 2017-04-12 MED FILL — Lidocaine HCl Local Inj 1%: INTRAMUSCULAR | Qty: 20 | Status: AC

## 2017-04-12 MED FILL — Verapamil HCl IV Soln 2.5 MG/ML: INTRAVENOUS | Qty: 2 | Status: AC

## 2017-04-12 MED FILL — Heparin Sodium (Porcine) 2 Unit/ML in Sodium Chloride 0.9%: INTRAMUSCULAR | Qty: 1000 | Status: AC

## 2017-04-12 NOTE — Progress Notes (Signed)
Progress Note  Patient Name: [name] [name] Date of Encounter: 04/12/2017  Primary Cardiologist:  Allyson Sabal    Subjective   47 year old gentleman who was admitted with a non-ST segment elevation myocardial infarction.  Heart catheterization revealed severe three-vessel coronary artery disease. He is been seen by TCT S.  We are waiting for his Effient to washout before he proceeds with bypass surgery.    Inpatient Medications    Scheduled Meds: . aspirin EC  81 mg Oral Daily  . atorvastatin  80 mg Oral QHS  . carvedilol  3.125 mg Oral BID WC  . famotidine  10 mg Oral QHS  . losartan  12.5 mg Oral Daily  . nicotine  14 mg Transdermal Daily  . sodium chloride flush  3 mL Intravenous Q12H  . spironolactone  25 mg Oral Daily   Continuous Infusions: . sodium chloride    . heparin 1,150 Units/hr (04/11/17 1800)   PRN Meds: sodium chloride, acetaminophen, nitroGLYCERIN, ondansetron (ZOFRAN) IV, sodium chloride flush   Vital Signs    Vitals:   04/11/17 1057 04/11/17 2210 04/12/17 0628 04/12/17 0937  BP: (!) 102/50 (!) 97/58 107/66 109/63  Pulse: (!) 55 (!) 57 65 (!) 53  Resp: 18  18   Temp: 97.8 F (36.6 C) 97.9 F (36.6 C) (!) 97.3 F (36.3 C)   TempSrc: Oral Oral Oral   SpO2: 97% 100% 97%   Weight:   173 lb 11.2 oz (78.8 kg)   Height:        Intake/Output Summary (Last 24 hours) at 04/12/2017 1043 Last data filed at 04/12/2017 0600 Gross per 24 hour  Intake 1235.38 ml  Output -  Net 1235.38 ml   Filed Weights   04/10/17 0454 04/11/17 0516 04/12/17 0628  Weight: 175 lb 14.8 oz (79.8 kg) 174 lb 1.6 oz (79 kg) 173 lb 11.2 oz (78.8 kg)    Telemetry    NSR  - Personally Reviewed  ECG     NSR  - Personally Reviewed  Physical Exam   GEN: No acute distress.   alseep in bed Neck: No JVD Cardiac: RRR, no murmurs, rubs, or gallops.  Respiratory: Clear to auscultation bilaterally. GI: Soft, nontender, non-distended  MS: No edema; No deformity. Right femoral cath  site looks good  Neuro:  Nonfocal  Psych: Normal affect   Labs    Chemistry Recent Labs  Lab 04/10/17 0252 04/11/17 0758 04/12/17 0643  NA 140 140 140  K 4.0 4.0 3.8  CL 104 107 106  CO2 25 25 24   GLUCOSE 104* 106* 110*  BUN 12 9 10   CREATININE 1.12 1.15 1.18  CALCIUM 8.1* 8.7* 8.4*  PROT  --  6.4*  --   ALBUMIN  --  3.4*  --   AST  --  63*  --   ALT  --  20  --   ALKPHOS  --  91  --   BILITOT  --  0.8  --   GFRNONAA >60 >60 >60  GFRAA >60 >60 >60  ANIONGAP 11 8 10      Hematology Recent Labs  Lab 04/10/17 0252 04/11/17 0758 04/12/17 0643  WBC 11.8* 11.2* 11.2*  RBC 4.42 4.63 4.76  HGB 12.7* 13.5 13.7  HCT 39.0 40.9 41.5  MCV 88.2 88.3 87.2  MCH 28.7 29.2 28.8  MCHC 32.6 33.0 33.0  RDW 14.8 14.7 14.3  PLT 225 209 235    Cardiac Enzymes Recent Labs  Lab 04/08/17 1743 04/08/17  2338 04/09/17 0606  TROPONINI 0.13* 0.07* 0.05*    Recent Labs  Lab 04/08/17 1235  TROPIPOC 0.11*     BNPNo results for input(s): BNP, PROBNP in the last 168 hours.   DDimer No results for input(s): DDIMER in the last 168 hours.   Radiology    No results found.  Cardiac Studies      Patient Profile     47 y.o. male with known CAD .  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  Plan is for coronary artery bypass grafting.  Assessment & Plan    Coronary artery disease: Patient has had stenting in the past.  He has a stent that goes back into the ostium of the vein.  There is significant restenosis of the stent.  The plan is for coronary artery bypass grafting Has been on hold since Thursday of last week.  P2Y12 is 128 ( normal 194-418)   Surgery in 2-3 days   For questions or updates, please contact [facility] Please consult [email or web address] for contact info under Cardiology/STEMI.      Signed, Kristeen MissPhilip Jazmen Lindenbaum, MD  04/12/2017, 10:43 AM

## 2017-04-12 NOTE — Progress Notes (Signed)
[id number] Pt quiet when I entered room and did not speak when asked question. Mother in room. Seems upset with so many different people coming in. I gave pt and mother OHS booklet, care guide and in the tube handout. Wrote down how to view preop video. Gave pt IS and he was able to get to 2500 ml correctly on first try. Discussed keeping arms close to body after surgery adhering to sternal precautions. Discussed importance of walking and IS after surgery. Mother stated she will be available to be with him 24/7 after discharge as he is staying with her right now. Pt stated he has been up in room by himself. Since he has 80% LM and he seems overwhelmed with so many different people working with him, will follow up after surgery. Luetta NuttingCharlene Isaak Delmundo RN BSN 04/12/2017 11:26 AM

## 2017-04-12 NOTE — Progress Notes (Signed)
ANTICOAGULATION CONSULT NOTE - Follow Up Consult  Pharmacy Consult for Heparin Indication: chest pain/ACS  Patient Measurements: Height: 5\' 9"  (175.3 cm) Weight: 173 lb 11.2 oz (78.8 kg) IBW/kg (Calculated) : 70.7    Labs: Recent Labs    04/10/17 0252 04/10/17 0850 04/11/17 0758 04/12/17 0643  HGB 12.7*  --  13.5 13.7  HCT 39.0  --  40.9 41.5  PLT 225  --  209 235  HEPARINUNFRC 0.20* 0.45 0.44 0.43  CREATININE 1.12  --  1.15 1.18   Estimated Creatinine Clearance: 78.2 mL/min (by C-G formula based on SCr of 1.18 mg/dL).  Assessment: 47 year old male s/p cath revealed severe 3vCAD,  with plans for evaluation for CABG.  Waiting for his Effient to washout before he proceeds with bypass surgery.  Heparin level is therapeutic at 0.43 on 1150 units/hr. Hgb is stable at 13.7, plts 235k. No signs/symptoms of bleeding. No infusion issues reported.  Goal of Therapy:  Heparin level 0.3-0.7 units/ml Monitor platelets by anticoagulation protocol: Yes   Plan:  Continue heparin at 1150 units/hr Daily HL, monitor CBC and for s/sx of bleeding Follow up if plans for surgery  Noah Delaineuth Rikayla Demmon, RPh Clinical Pharmacist Pager: [phone] 8A-4P #[phone] 4P-10P #[phone] [facility] #[id number] 04/12/2017 10:59 AM

## 2017-04-12 NOTE — Plan of Care (Signed)
  Problem: Cardiac: Goal: Ability to achieve and maintain adequate cardiovascular perfusion will improve Outcome: Progressing   Problem: Education: Goal: Understanding of CV disease, CV risk reduction, and recovery process will improve Outcome: Progressing

## 2017-04-12 NOTE — Progress Notes (Signed)
Patient refuses to have bed lowered to lowest position. Patient has been educated but still refuses.

## 2017-04-12 NOTE — Progress Notes (Signed)
      [address]       [place] [id number]             [phone]     CARDIOTHORACIC SURGERY PROGRESS NOTE  3 Days Post-Op  S/P Procedure(s) (LRB): LEFT HEART CATH AND CORONARY ANGIOGRAPHY (N/A)  Subjective: No chest pain or SOB  Objective: Vital signs in last 24 hours: Temp:  [97.3 F (36.3 C)-97.9 F (36.6 C)] 97.3 F (36.3 C) (03/25 0628) Pulse Rate:  [55-65] 65 (03/25 0628) Cardiac Rhythm: Sinus bradycardia (03/24 1900) Resp:  [18] 18 (03/25 0628) BP: (97-107)/(50-66) 107/66 (03/25 0628) SpO2:  [97 %-100 %] 97 % (03/25 0628) Weight:  [173 lb 11.2 oz (78.8 kg)] 173 lb 11.2 oz (78.8 kg) (03/25 [id number])  Physical Exam:  Rhythm:   sinus  Breath sounds: clear  Heart sounds:  RRR  Incisions:  n/a  Abdomen:  soft  Extremities:  warm   Intake/Output from previous day: 03/24 0701 - 03/25 0700 In: 1475.4 [P.O.:840; I.V.:635.4] Out: -  Intake/Output this shift: No intake/output data recorded.  Lab Results: Recent Labs    04/11/17 0758 04/12/17 0643  WBC 11.2* 11.2*  HGB 13.5 13.7  HCT 40.9 41.5  PLT 209 235   BMET:  Recent Labs    04/11/17 0758 04/12/17 0643  NA 140 140  K 4.0 3.8  CL 107 106  CO2 25 24  GLUCOSE 106* 110*  BUN 9 10  CREATININE 1.15 1.18  CALCIUM 8.7* 8.4*    CBG (last 3)  Recent Labs    04/09/17 1203 04/09/17 1640  GLUCAP 90 157*   PT/INR:   Recent Labs    04/09/17 1059  LABPROT 13.1  INR 1.00   Results for [name] [name] (MRN [id number]) as of 04/12/2017 09:02  Ref. Range 04/12/2017 06:43  Platelet Function  P2Y12 Latest Ref Range: 194 - 418 PRU 128 (L)    CXR:  CHEST - 2 VIEW  COMPARISON:  Chest x-ray of May 31, 2015  FINDINGS: The lungs are well-expanded. There is no focal infiltrate. There is no pleural effusion, pneumothorax, or pneumomediastinum. The heart and pulmonary vascularity are normal. The mediastinum is normal in width. The bony thorax exhibits no acute abnormality. There is gentle  levocurvature centered in the lower thoracic spine which is stable.  IMPRESSION: There is no active cardiopulmonary disease nor evidence of acute post traumatic injury.   Electronically Signed   By: David  SwazilandJordan M.D.   On: 04/08/2017 13:07   Assessment/Plan: S/P Procedure(s) (LRB): LEFT HEART CATH AND CORONARY ANGIOGRAPHY (N/A)  Clinically stable Last dose Effient 3/22 and platelet function P2Y12 still abnormal Anticipate likely CABG 3/27 or 3/28  I spent in excess of 15 minutes during the conduct of this hospital encounter and >50% of this time involved direct face-to-face encounter with the patient for counseling and/or coordination of their care.   [name] Nailslarence H Aprille Sawhney, MD 04/12/2017 8:40 AM

## 2017-04-13 ENCOUNTER — Inpatient Hospital Stay (HOSPITAL_COMMUNITY): Payer: Self-pay

## 2017-04-13 DIAGNOSIS — Z01810 Encounter for preprocedural cardiovascular examination: Secondary | ICD-10-CM

## 2017-04-13 LAB — COMPREHENSIVE METABOLIC PANEL
ALT: 63 U/L (ref 17–63)
AST: 132 U/L — ABNORMAL HIGH (ref 15–41)
Albumin: 3.4 g/dL — ABNORMAL LOW (ref 3.5–5.0)
Alkaline Phosphatase: 97 U/L (ref 38–126)
Anion gap: 9 (ref 5–15)
BUN: 11 mg/dL (ref 6–20)
CO2: 23 mmol/L (ref 22–32)
Calcium: 8.7 mg/dL — ABNORMAL LOW (ref 8.9–10.3)
Chloride: 108 mmol/L (ref 101–111)
Creatinine, Ser: 1.16 mg/dL (ref 0.61–1.24)
GFR calc Af Amer: >60 mL/min (ref >60)
GFR calc non Af Amer: >60 mL/min (ref >60)
Glucose, Bld: 102 mg/dL — ABNORMAL HIGH (ref 65–99)
Potassium: 4.2 mmol/L (ref 3.5–5.1)
Sodium: 140 mmol/L (ref 135–145)
Total Bilirubin: 0.7 mg/dL (ref 0.3–1.2)
Total Protein: 6.5 g/dL (ref 6.5–8.1)

## 2017-04-13 LAB — CBC
HCT: 41.7 % (ref 39.0–52.0)
Hemoglobin: 13.9 g/dL (ref 13.0–17.0)
MCH: 28.9 pg (ref 26.0–34.0)
MCHC: 33.3 g/dL (ref 30.0–36.0)
MCV: 86.7 fL (ref 78.0–100.0)
Platelets: 234 10*3/uL (ref 150–400)
RBC: 4.81 MIL/uL (ref 4.22–5.81)
RDW: 14.1 % (ref 11.5–15.5)
WBC: 11.9 10*3/uL — ABNORMAL HIGH (ref 4.0–10.5)

## 2017-04-13 LAB — PULMONARY FUNCTION TEST
FEF 25-75 POST: 3.56 L/s
FEF 25-75 Pre: 3.17 L/sec
FEF2575-%Change-Post: 12 %
FEF2575-%PRED-POST: 100 %
FEF2575-%Pred-Pre: 89 %
FEV1-%Change-Post: 2 %
FEV1-%Pred-Post: 85 %
FEV1-%Pred-Pre: 83 %
FEV1-Post: 3.33 L
FEV1-Pre: 3.26 L
FEV1FVC-%CHANGE-POST: 4 %
FEV1FVC-%PRED-PRE: 102 %
FEV6-%Change-Post: -1 %
FEV6-%PRED-PRE: 84 %
FEV6-%Pred-Post: 82 %
FEV6-Post: 4 L
FEV6-Pre: 4.09 L
FEV6FVC-%Pred-Post: 103 %
FEV6FVC-%Pred-Pre: 103 %
FVC-%Change-Post: -1 %
FVC-%PRED-POST: 80 %
FVC-%Pred-Pre: 81 %
FVC-POST: 4 L
FVC-Pre: 4.09 L
POST FEV6/FVC RATIO: 100 %
PRE FEV1/FVC RATIO: 80 %
Post FEV1/FVC ratio: 83 %
Pre FEV6/FVC Ratio: 100 %

## 2017-04-13 LAB — BLOOD GAS, ARTERIAL
Acid-Base Excess: 1.9 mmol/L (ref 0.0–2.0)
Bicarbonate: 26.2 mmol/L (ref 20.0–28.0)
DRAWN BY: 52076
FIO2: 21
O2 SAT: 89.1 %
PATIENT TEMPERATURE: 98.6
pCO2 arterial: 43 mmHg (ref 32.0–48.0)
pH, Arterial: 7.402 (ref 7.350–7.450)
pO2, Arterial: 56.9 mmHg — ABNORMAL LOW (ref 83.0–108.0)

## 2017-04-13 LAB — TYPE AND SCREEN
ABO/RH(D): O POS
Antibody Screen: NEGATIVE

## 2017-04-13 LAB — PLATELET INHIBITION P2Y12: Platelet Function  P2Y12: 169 [PRU] — ABNORMAL LOW (ref 194–418)

## 2017-04-13 LAB — SURGICAL PCR SCREEN
MRSA, PCR: NEGATIVE
STAPHYLOCOCCUS AUREUS: NEGATIVE

## 2017-04-13 LAB — APTT: APTT: 77 s — AB (ref 24–36)

## 2017-04-13 LAB — ABO/RH: ABO/RH(D): O POS

## 2017-04-13 LAB — PROTIME-INR
INR: 1.03
Prothrombin Time: 13.4 seconds (ref 11.4–15.2)

## 2017-04-13 LAB — HEPARIN LEVEL (UNFRACTIONATED): Heparin Unfractionated: 0.44 IU/mL (ref 0.30–0.70)

## 2017-04-13 MED ORDER — FAMOTIDINE 20 MG PO TABS
10.0000 mg | ORAL_TABLET | ORAL | Status: DC
  Administered 2017-04-13: 10 mg via ORAL
  Filled 2017-04-13: qty 1

## 2017-04-13 MED ORDER — CHLORHEXIDINE GLUCONATE 0.12 % MT SOLN
15.0000 mL | Freq: Once | OROMUCOSAL | Status: AC
  Administered 2017-04-14: 15 mL via OROMUCOSAL
  Filled 2017-04-13: qty 15

## 2017-04-13 MED ORDER — DEXMEDETOMIDINE HCL IN NACL 400 MCG/100ML IV SOLN
0.1000 ug/kg/h | INTRAVENOUS | Status: AC
  Administered 2017-04-14: 0.7 ug/kg/h via INTRAVENOUS
  Filled 2017-04-13: qty 100

## 2017-04-13 MED ORDER — TRANEXAMIC ACID 1000 MG/10ML IV SOLN
1.5000 mg/kg/h | INTRAVENOUS | Status: AC
  Administered 2017-04-14: 1.5 mg/kg/h via INTRAVENOUS
  Filled 2017-04-13: qty 25

## 2017-04-13 MED ORDER — SODIUM CHLORIDE 0.9 % IV SOLN
750.0000 mg | INTRAVENOUS | Status: AC
  Administered 2017-04-14: 750 mg via INTRAVENOUS
  Filled 2017-04-13: qty 750

## 2017-04-13 MED ORDER — BISACODYL 5 MG PO TBEC
5.0000 mg | DELAYED_RELEASE_TABLET | Freq: Once | ORAL | Status: AC
  Administered 2017-04-13: 5 mg via ORAL
  Filled 2017-04-13: qty 1

## 2017-04-13 MED ORDER — CHLORHEXIDINE GLUCONATE CLOTH 2 % EX PADS
6.0000 | MEDICATED_PAD | Freq: Once | CUTANEOUS | Status: AC
  Administered 2017-04-14: 6 via TOPICAL

## 2017-04-13 MED ORDER — CHLORHEXIDINE GLUCONATE CLOTH 2 % EX PADS
6.0000 | MEDICATED_PAD | Freq: Once | CUTANEOUS | Status: AC
  Administered 2017-04-13: 6 via TOPICAL

## 2017-04-13 MED ORDER — SODIUM CHLORIDE 0.9 % IV SOLN
INTRAVENOUS | Status: AC
  Administered 2017-04-14: .9 [IU]/h via INTRAVENOUS
  Filled 2017-04-13: qty 1

## 2017-04-13 MED ORDER — PLASMA-LYTE 148 IV SOLN
INTRAVENOUS | Status: AC
  Administered 2017-04-14: 500 mL
  Filled 2017-04-13: qty 2.5

## 2017-04-13 MED ORDER — MAGNESIUM SULFATE 50 % IJ SOLN
40.0000 meq | INTRAMUSCULAR | Status: DC
  Filled 2017-04-13: qty 9.85

## 2017-04-13 MED ORDER — SODIUM CHLORIDE 0.9 % IV SOLN
1.5000 g | INTRAVENOUS | Status: AC
  Administered 2017-04-14: 1.5 g via INTRAVENOUS
  Filled 2017-04-13: qty 1.5

## 2017-04-13 MED ORDER — MILRINONE LACTATE IN DEXTROSE 20-5 MG/100ML-% IV SOLN
0.1250 ug/kg/min | INTRAVENOUS | Status: DC
  Filled 2017-04-13: qty 100

## 2017-04-13 MED ORDER — METOPROLOL TARTRATE 12.5 MG HALF TABLET
12.5000 mg | ORAL_TABLET | Freq: Once | ORAL | Status: AC
  Administered 2017-04-14: 12.5 mg via ORAL
  Filled 2017-04-13: qty 1

## 2017-04-13 MED ORDER — SODIUM CHLORIDE 0.9 % IV SOLN
30.0000 ug/min | INTRAVENOUS | Status: AC
  Administered 2017-04-14: 50 ug/min via INTRAVENOUS
  Filled 2017-04-13: qty 2

## 2017-04-13 MED ORDER — DEXTROSE 5 % IV SOLN
0.0000 ug/min | INTRAVENOUS | Status: DC
  Filled 2017-04-13: qty 4

## 2017-04-13 MED ORDER — VANCOMYCIN HCL 10 G IV SOLR
1250.0000 mg | INTRAVENOUS | Status: AC
  Administered 2017-04-14: 1250 mg via INTRAVENOUS
  Filled 2017-04-13: qty 1250

## 2017-04-13 MED ORDER — TRANEXAMIC ACID (OHS) BOLUS VIA INFUSION
15.0000 mg/kg | INTRAVENOUS | Status: AC
  Administered 2017-04-14: 1180.5 mg via INTRAVENOUS
  Filled 2017-04-13: qty 1181

## 2017-04-13 MED ORDER — SODIUM CHLORIDE 0.9 % IV SOLN
INTRAVENOUS | Status: DC
  Filled 2017-04-13: qty 30

## 2017-04-13 MED ORDER — DOPAMINE-DEXTROSE 3.2-5 MG/ML-% IV SOLN
0.0000 ug/kg/min | INTRAVENOUS | Status: DC
  Filled 2017-04-13 (×2): qty 250

## 2017-04-13 MED ORDER — ALBUTEROL SULFATE (2.5 MG/3ML) 0.083% IN NEBU
2.5000 mg | INHALATION_SOLUTION | Freq: Once | RESPIRATORY_TRACT | Status: AC
  Administered 2017-04-13: 2.5 mg via RESPIRATORY_TRACT

## 2017-04-13 MED ORDER — TRANEXAMIC ACID (OHS) PUMP PRIME SOLUTION
2.0000 mg/kg | INTRAVENOUS | Status: DC
  Filled 2017-04-13: qty 1.57

## 2017-04-13 MED ORDER — POTASSIUM CHLORIDE 2 MEQ/ML IV SOLN
80.0000 meq | INTRAVENOUS | Status: DC
  Filled 2017-04-13: qty 40

## 2017-04-13 MED ORDER — NITROGLYCERIN IN D5W 200-5 MCG/ML-% IV SOLN
2.0000 ug/min | INTRAVENOUS | Status: DC
  Filled 2017-04-13: qty 250

## 2017-04-13 MED ORDER — TEMAZEPAM 15 MG PO CAPS: 15.0000 mg | ORAL_CAPSULE | Freq: Once | ORAL | Status: DC | PRN

## 2017-04-13 NOTE — Progress Notes (Signed)
Patients O2 from ABG on RA was low. Respiratory therapist called to let RN know that he may need to wear 2L O2 via nasal cannula. Patient refused to wear oxygen, O2 sat 98% and nasal cannula left beside patient so that he can wear it if he needs. Patient encouraged and educated.

## 2017-04-13 NOTE — Progress Notes (Signed)
Pre-op Cardiac Surgery  Carotid Findings:  Elevated bilateral internal carotid artery velocities suggest 40-59% right internal carotid artery stenosis and 60-79% left internal carotid artery stenosis. Vertebral arteries are patent with antegrade flow.  Upper Extremity Right Left  Brachial Pressures 140-Triphasic Triphasic  Radial Waveforms Triphasic Triphasic  Ulnar Waveforms Triphasic Triphasic  Palmar Arch (Allen's Test) Signal decreases <50% with radial compression, obliterates with ulnar compression. Signal obliterates with radial compression, is unchanged with ulnar compression.    Lower  Extremity Right Left  Dorsalis Pedis 124-Monophasic 171-Monophasic  Anterior Tibial    Posterior Tibial 144-Triphasic 162-Biphasic  Ankle/Brachial Indices 1.03 1.22    Findings:   ABIs are within normal limits at rest. When compared to previous ABI 07/2016, pedal waveforms have dampened.  04/13/2017 11:10 AM Gertie FeyMichelle Phiona Ramnauth, BS, RVT, RDCS, RDMS

## 2017-04-13 NOTE — Progress Notes (Addendum)
ANTICOAGULATION CONSULT NOTE - Follow Up Consult  Pharmacy Consult for Heparin Indication: chest pain/ACS  Patient Measurements: Height: 5\' 9"  (175.3 cm) Weight: 173 lb 6.4 oz (78.7 kg) IBW/kg (Calculated) : 70.7    Labs: Recent Labs    04/11/17 0758 04/12/17 0643 04/13/17 0615  HGB 13.5 13.7 13.9  HCT 40.9 41.5 41.7  PLT 209 235 234  LABPROT  --   --  13.4  INR  --   --  1.03  HEPARINUNFRC 0.44 0.43 0.44  CREATININE 1.15 1.18 1.16   Estimated Creatinine Clearance: 79.6 mL/min (by C-G formula based on SCr of 1.16 mg/dL).  Assessment: 47 year old male s/p cath revealed severe 3vCAD,  with plans for evaluation for CABG.  Waiting for his Effient to washout before he proceeds with bypass surgery. Last dose of effient given 3/22.  Heparin level remains therapeutic at 0.44 on 1150 units/hr. CBC is stable with Hgb at 13.9 plts 234k. No signs/symptoms of bleeding.   Goal of Therapy:  Heparin level 0.3-0.7 units/ml Monitor platelets by anticoagulation protocol: Yes   Plan:  Continue heparin at 1150 units/hr Daily HL, monitor CBC and for s/sx of bleeding Follow up if plans for surgery, likely 3/27 or 3/28 (currently on OR schedule for 3/27 @0715 )  [name] [name], RPh Clinical Pharmacist Pager: [phone]-4P [phone]-10P [phone] [facility] #[id number] 04/13/2017 10:05 AM

## 2017-04-13 NOTE — Progress Notes (Signed)
Progress Note  Patient Name: [name] [name] Date of Encounter: 04/13/2017  Primary Cardiologist:  Allyson Sabal  Subjective    47 year old gentleman who was admitted with a non-ST segment elevation myocardial infarction.  Heart catheterization revealed severe three-vessel coronary artery disease. He is been seen by TCT S.  We are waiting for his Effient to washout before he proceeds with bypass surgery  P2Y12 is 169 today ( up from 128)    Inpatient Medications    Scheduled Meds: . aspirin EC  81 mg Oral Daily  . atorvastatin  80 mg Oral QHS  . carvedilol  3.125 mg Oral BID WC  . famotidine  10 mg Oral QHS  . losartan  12.5 mg Oral Daily  . nicotine  14 mg Transdermal Daily  . sodium chloride flush  3 mL Intravenous Q12H  . spironolactone  25 mg Oral Daily   Continuous Infusions: . sodium chloride    . heparin 1,150 Units/hr (04/12/17 1633)   PRN Meds: sodium chloride, acetaminophen, nitroGLYCERIN, ondansetron (ZOFRAN) IV, sodium chloride flush   Vital Signs    Vitals:   04/12/17 1300 04/12/17 2100 04/13/17 0548 04/13/17 1119  BP: 100/63 (!) 111/58 (!) 105/50 117/71  Pulse: (!) 56 (!) 47 (!) 55 (!) 52  Resp: 20 20 20 18   Temp: 98 F (36.7 C) 97.7 F (36.5 C) 97.8 F (36.6 C) 97.6 F (36.4 C)  TempSrc: Oral Oral Oral Oral  SpO2: 96% 96% 100% 99%  Weight:   173 lb 6.4 oz (78.7 kg)   Height:        Intake/Output Summary (Last 24 hours) at 04/13/2017 1321 Last data filed at 04/13/2017 1100 Gross per 24 hour  Intake 470 ml  Output 1300 ml  Net -830 ml   Filed Weights   04/11/17 0516 04/12/17 0628 04/13/17 0548  Weight: 174 lb 1.6 oz (79 kg) 173 lb 11.2 oz (78.8 kg) 173 lb 6.4 oz (78.7 kg)    Telemetry    NSR  - Personally Reviewed  ECG     NSR  - Personally Reviewed  Physical Exam   GEN: No acute distress.   Neck: No JVD Cardiac: RRR, no murmurs, rubs, or gallops.  Respiratory: Clear to auscultation bilaterally. GI: Soft, nontender, non-distended    MS: No edema; No deformity. Neuro:  Nonfocal  Psych: Normal affect   Labs    Chemistry Recent Labs  Lab 04/11/17 0758 04/12/17 0643 04/13/17 0615  NA 140 140 140  K 4.0 3.8 4.2  CL 107 106 108  CO2 25 24 23   GLUCOSE 106* 110* 102*  BUN 9 10 11   CREATININE 1.15 1.18 1.16  CALCIUM 8.7* 8.4* 8.7*  PROT 6.4*  --  6.5  ALBUMIN 3.4*  --  3.4*  AST 63*  --  132*  ALT 20  --  63  ALKPHOS 91  --  97  BILITOT 0.8  --  0.7  GFRNONAA >60 >60 >60  GFRAA >60 >60 >60  ANIONGAP 8 10 9      Hematology Recent Labs  Lab 04/11/17 0758 04/12/17 0643 04/13/17 0615  WBC 11.2* 11.2* 11.9*  RBC 4.63 4.76 4.81  HGB 13.5 13.7 13.9  HCT 40.9 41.5 41.7  MCV 88.3 87.2 86.7  MCH 29.2 28.8 28.9  MCHC 33.0 33.0 33.3  RDW 14.7 14.3 14.1  PLT 209 235 234    Cardiac Enzymes Recent Labs  Lab 04/08/17 1743 04/08/17 2338 04/09/17 0606  TROPONINI 0.13* 0.07* 0.05*  Recent Labs  Lab 04/08/17 1235  TROPIPOC 0.11*     BNPNo results for input(s): BNP, PROBNP in the last 168 hours.   DDimer No results for input(s): DDIMER in the last 168 hours.   Radiology    No results found.  Cardiac Studies      Patient Profile     47 y.o. male with severe three-vessel coronary artery disease.  He has been referred for coronary artery bypass grafting.  Assessment & Plan    1.  Coronary artery disease: Kaisei has severe three-vessel coronary disease.  He has significant in-stent restenosis.  We are waiting on his Effient to wash out of his system and will anticipating coronary artery bypass surgery soon.  For questions or updates, please contact [facility] Please consult [email or web address] for contact info under Cardiology/STEMI.      Signed, Kristeen MissPhilip Man Bonneau, MD  04/13/2017, 1:21 PM

## 2017-04-13 NOTE — Anesthesia Preprocedure Evaluation (Addendum)
Anesthesia Evaluation  Patient identified by MRN, date of birth, ID band Patient awake    Reviewed: Allergy & Precautions, NPO status , Patient's Chart, lab work & pertinent test results, reviewed documented beta blocker date and time   Airway Mallampati: II  TM Distance: >3 FB Neck ROM: Full    Dental  (+) Poor Dentition, Chipped, Loose, Missing, Dental Advisory Given   Pulmonary former smoker,    Pulmonary exam normal breath sounds clear to auscultation       Cardiovascular hypertension, Pt. on medications and Pt. on home beta blockers + CAD, + Past MI and + Peripheral Vascular Disease  Normal cardiovascular exam Rhythm:Regular Rate:Normal  '19 Cath - Ost LM to Dist LM lesion is 80% stenosed. Prox LAD to Mid LAD lesion is 30% stenosed. Ost Cx to Prox Cx lesion is 40% stenosed.   1.  Severe in-stent restenosis involving the ostium of an unprotected left main stem 2.  Continued patency of the stented segment in the mid LAD with mild in-stent restenosis 3.  Patent left circumflex with mild ostial stenosis 4.  Small RCA without focal stenosis 5.  Normal LVEDP.  Echo currently pending to assess LV function.  '19 TTE - Left ventricle:  The cavity size was normal, mild LVH. EF 55% to 60%. No regional Arnall motion abnormalities. Trivial TR. Rest of echo wnl.   '19 Carotid US - Right Carotid: 40-59% stenosis. Left Carotid: 60-79% stenosis.   Neuro/Psych negative neurological ROS  negative psych ROS   GI/Hepatic Neg liver ROS, GERD  Medicated and Controlled,  Endo/Other  negative endocrine ROS  Renal/GU negative Renal ROS  negative genitourinary   Musculoskeletal negative musculoskeletal ROS (+)   Abdominal   Peds  Hematology negative hematology ROS (+)   Anesthesia Other Findings   Reproductive/Obstetrics                            Anesthesia Physical Anesthesia Plan  ASA:  IV  Anesthesia Plan: General   Post-op Pain Management:    Induction: Intravenous  PONV Risk Score and Plan: 2 and Treatment may vary due to age or medical condition  Airway Management Planned: Oral ETT  Additional Equipment: Arterial line, CVP, PA Cath, TEE and Ultrasound Guidance Line Placement  Intra-op Plan:   Post-operative Plan: Post-operative intubation/ventilation  Informed Consent: I have reviewed the patients History and Physical, chart, labs and discussed the procedure including the risks, benefits and alternatives for the proposed anesthesia with the patient or authorized representative who has indicated his/her understanding and acceptance.   Dental advisory given  Plan Discussed with: CRNA and Anesthesiologist  Anesthesia Plan Comments:         Anesthesia Quick Evaluation
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
## 2017-04-14 ENCOUNTER — Ambulatory Visit (HOSPITAL_COMMUNITY): Payer: Self-pay

## 2017-04-14 ENCOUNTER — Other Ambulatory Visit: Payer: Self-pay

## 2017-04-14 ENCOUNTER — Inpatient Hospital Stay (HOSPITAL_COMMUNITY): Payer: Self-pay

## 2017-04-14 ENCOUNTER — Inpatient Hospital Stay (HOSPITAL_COMMUNITY): Payer: Self-pay | Admitting: Anesthesiology

## 2017-04-14 ENCOUNTER — Inpatient Hospital Stay (HOSPITAL_COMMUNITY)
Admission: EM | Disposition: A | Discharge status: Home / Self Care | Payer: Self-pay | Source: Home / Self Care | Attending: Internal Medicine

## 2017-04-14 DIAGNOSIS — Z951 Presence of aortocoronary bypass graft: Secondary | ICD-10-CM

## 2017-04-14 HISTORY — PX: CORONARY ARTERY BYPASS GRAFT: SHX141

## 2017-04-14 HISTORY — PX: TEE WITHOUT CARDIOVERSION: SHX5443

## 2017-04-14 LAB — POCT I-STAT, CHEM 8
BUN: 15 mg/dL (ref 6–20)
BUN: 11 mg/dL (ref 6–20)
BUN: 14 mg/dL (ref 6–20)
BUN: 13 mg/dL (ref 6–20)
BUN: 13 mg/dL (ref 6–20)
BUN: 12 mg/dL (ref 6–20)
CALCIUM ION: 1.13 mmol/L — AB (ref 1.15–1.40)
CALCIUM ION: 1.19 mmol/L (ref 1.15–1.40)
CHLORIDE: 102 mmol/L (ref 101–111)
CHLORIDE: 107 mmol/L (ref 101–111)
CREATININE: 0.7 mg/dL (ref 0.61–1.24)
CREATININE: 0.9 mg/dL (ref 0.61–1.24)
CREATININE: 1 mg/dL (ref 0.61–1.24)
Calcium, Ion: 1.21 mmol/L (ref 1.15–1.40)
Calcium, Ion: 0.93 mmol/L — ABNORMAL LOW (ref 1.15–1.40)
Calcium, Ion: 1.26 mmol/L (ref 1.15–1.40)
Calcium, Ion: 1.12 mmol/L — ABNORMAL LOW (ref 1.15–1.40)
Chloride: 105 mmol/L (ref 101–111)
Chloride: 107 mmol/L (ref 101–111)
Chloride: 106 mmol/L (ref 101–111)
Chloride: 106 mmol/L (ref 101–111)
Creatinine, Ser: 1.1 mg/dL (ref 0.61–1.24)
Creatinine, Ser: 0.9 mg/dL (ref 0.61–1.24)
Creatinine, Ser: 1.1 mg/dL (ref 0.61–1.24)
GLUCOSE: 122 mg/dL — AB (ref 65–99)
Glucose, Bld: 107 mg/dL — ABNORMAL HIGH (ref 65–99)
Glucose, Bld: 106 mg/dL — ABNORMAL HIGH (ref 65–99)
Glucose, Bld: 89 mg/dL (ref 65–99)
Glucose, Bld: 106 mg/dL — ABNORMAL HIGH (ref 65–99)
Glucose, Bld: 116 mg/dL — ABNORMAL HIGH (ref 65–99)
HEMATOCRIT: 36 % — AB (ref 39.0–52.0)
HEMATOCRIT: 26 % — AB (ref 39.0–52.0)
HEMATOCRIT: 34 % — AB (ref 39.0–52.0)
HEMATOCRIT: 30 % — AB (ref 39.0–52.0)
HEMATOCRIT: 29 % — AB (ref 39.0–52.0)
HEMATOCRIT: 31 % — AB (ref 39.0–52.0)
HEMOGLOBIN: 12.2 g/dL — AB (ref 13.0–17.0)
HEMOGLOBIN: 9.9 g/dL — AB (ref 13.0–17.0)
Hemoglobin: 11.6 g/dL — ABNORMAL LOW (ref 13.0–17.0)
Hemoglobin: 8.8 g/dL — ABNORMAL LOW (ref 13.0–17.0)
Hemoglobin: 10.2 g/dL — ABNORMAL LOW (ref 13.0–17.0)
Hemoglobin: 10.5 g/dL — ABNORMAL LOW (ref 13.0–17.0)
POTASSIUM: 4.2 mmol/L (ref 3.5–5.1)
POTASSIUM: 4.7 mmol/L (ref 3.5–5.1)
POTASSIUM: 4.8 mmol/L (ref 3.5–5.1)
POTASSIUM: 5.7 mmol/L — AB (ref 3.5–5.1)
Potassium: 4.7 mmol/L (ref 3.5–5.1)
Potassium: 5.5 mmol/L — ABNORMAL HIGH (ref 3.5–5.1)
SODIUM: 142 mmol/L (ref 135–145)
SODIUM: 142 mmol/L (ref 135–145)
SODIUM: 142 mmol/L (ref 135–145)
SODIUM: 140 mmol/L (ref 135–145)
Sodium: 144 mmol/L (ref 135–145)
Sodium: 141 mmol/L (ref 135–145)
TCO2: 28 mmol/L (ref 22–32)
TCO2: 26 mmol/L (ref 22–32)
TCO2: 30 mmol/L (ref 22–32)
TCO2: 25 mmol/L (ref 22–32)
TCO2: 25 mmol/L (ref 22–32)
TCO2: 24 mmol/L (ref 22–32)

## 2017-04-14 LAB — HEPARIN LEVEL (UNFRACTIONATED): HEPARIN UNFRACTIONATED: 0.52 [IU]/mL (ref 0.30–0.70)

## 2017-04-14 LAB — GLUCOSE, CAPILLARY
GLUCOSE-CAPILLARY: 110 mg/dL — AB (ref 65–99)
GLUCOSE-CAPILLARY: 134 mg/dL — AB (ref 65–99)
Glucose-Capillary: 95 mg/dL (ref 65–99)

## 2017-04-14 LAB — URINALYSIS, ROUTINE W REFLEX MICROSCOPIC
BILIRUBIN URINE: NEGATIVE
Glucose, UA: NEGATIVE mg/dL
Hgb urine dipstick: NEGATIVE
KETONES UR: 5 mg/dL — AB
Leukocytes, UA: NEGATIVE
Nitrite: NEGATIVE
PROTEIN: NEGATIVE mg/dL
SPECIFIC GRAVITY, URINE: 1.026 (ref 1.005–1.030)
pH: 5 (ref 5.0–8.0)

## 2017-04-14 LAB — CBC
HCT: 41.7 % (ref 39.0–52.0)
HEMATOCRIT: 33.3 % — AB (ref 39.0–52.0)
HEMOGLOBIN: 13.6 g/dL (ref 13.0–17.0)
Hemoglobin: 10.9 g/dL — ABNORMAL LOW (ref 13.0–17.0)
MCH: 28.6 pg (ref 26.0–34.0)
MCH: 28.5 pg (ref 26.0–34.0)
MCHC: 32.6 g/dL (ref 30.0–36.0)
MCHC: 32.7 g/dL (ref 30.0–36.0)
MCV: 87.8 fL (ref 78.0–100.0)
MCV: 86.9 fL (ref 78.0–100.0)
PLATELETS: 133 10*3/uL — AB (ref 150–400)
Platelets: 250 10*3/uL (ref 150–400)
RBC: 4.75 MIL/uL (ref 4.22–5.81)
RBC: 3.83 MIL/uL — AB (ref 4.22–5.81)
RDW: 14.5 % (ref 11.5–15.5)
RDW: 14 % (ref 11.5–15.5)
WBC: 12.4 10*3/uL — ABNORMAL HIGH (ref 4.0–10.5)
WBC: 15.5 10*3/uL — ABNORMAL HIGH (ref 4.0–10.5)

## 2017-04-14 LAB — POCT I-STAT 3, ART BLOOD GAS (G3+)
ACID-BASE DEFICIT: 2 mmol/L (ref 0.0–2.0)
ACID-BASE EXCESS: 4 mmol/L — AB (ref 0.0–2.0)
Acid-base deficit: 2 mmol/L (ref 0.0–2.0)
Acid-base deficit: 2 mmol/L (ref 0.0–2.0)
Acid-base deficit: 3 mmol/L — ABNORMAL HIGH (ref 0.0–2.0)
BICARBONATE: 25.6 mmol/L (ref 20.0–28.0)
BICARBONATE: 28.7 mmol/L — AB (ref 20.0–28.0)
BICARBONATE: 25.4 mmol/L (ref 20.0–28.0)
Bicarbonate: 24.9 mmol/L (ref 20.0–28.0)
Bicarbonate: 23.7 mmol/L (ref 20.0–28.0)
O2 SAT: 100 %
O2 SAT: 99 %
O2 SAT: 99 %
O2 Saturation: 100 %
O2 Saturation: 100 %
PCO2 ART: 53 mmHg — AB (ref 32.0–48.0)
PCO2 ART: 50 mmHg — AB (ref 32.0–48.0)
PCO2 ART: 52.9 mmHg — AB (ref 32.0–48.0)
PCO2 ART: 49.4 mmHg — AB (ref 32.0–48.0)
PH ART: 7.292 — AB (ref 7.350–7.450)
PH ART: 7.419 (ref 7.350–7.450)
PO2 ART: 374 mmHg — AB (ref 83.0–108.0)
PO2 ART: 504 mmHg — AB (ref 83.0–108.0)
PO2 ART: 298 mmHg — AB (ref 83.0–108.0)
PO2 ART: 136 mmHg — AB (ref 83.0–108.0)
PO2 ART: 137 mmHg — AB (ref 83.0–108.0)
TCO2: 27 mmol/L (ref 22–32)
TCO2: 30 mmol/L (ref 22–32)
TCO2: 26 mmol/L (ref 22–32)
TCO2: 27 mmol/L (ref 22–32)
TCO2: 25 mmol/L (ref 22–32)
pCO2 arterial: 44.4 mmHg (ref 32.0–48.0)
pH, Arterial: 7.305 — ABNORMAL LOW (ref 7.350–7.450)
pH, Arterial: 7.29 — ABNORMAL LOW (ref 7.350–7.450)
pH, Arterial: 7.29 — ABNORMAL LOW (ref 7.350–7.450)

## 2017-04-14 LAB — CBC WITH DIFFERENTIAL/PLATELET
Basophils Absolute: 0 10*3/uL (ref 0.0–0.1)
Basophils Relative: 0 %
Eosinophils Absolute: 0.2 10*3/uL (ref 0.0–0.7)
Eosinophils Relative: 1 %
HCT: 32.7 % — ABNORMAL LOW (ref 39.0–52.0)
Hemoglobin: 11.1 g/dL — ABNORMAL LOW (ref 13.0–17.0)
Lymphocytes Relative: 14 %
Lymphs Abs: 3 10*3/uL (ref 0.7–4.0)
MCH: 29.4 pg (ref 26.0–34.0)
MCHC: 33.9 g/dL (ref 30.0–36.0)
MCV: 86.7 fL (ref 78.0–100.0)
Monocytes Absolute: 1.3 10*3/uL — ABNORMAL HIGH (ref 0.1–1.0)
Monocytes Relative: 6 %
Neutro Abs: 16.6 10*3/uL — ABNORMAL HIGH (ref 1.7–7.7)
Neutrophils Relative %: 79 %
Platelets: 148 10*3/uL — ABNORMAL LOW (ref 150–400)
RBC: 3.77 MIL/uL — ABNORMAL LOW (ref 4.22–5.81)
RDW: 14.5 % (ref 11.5–15.5)
WBC: 21.1 10*3/uL — ABNORMAL HIGH (ref 4.0–10.5)

## 2017-04-14 LAB — POCT I-STAT 4, (NA,K, GLUC, HGB,HCT)
Glucose, Bld: 87 mg/dL (ref 65–99)
HCT: 29 % — ABNORMAL LOW (ref 39.0–52.0)
Hemoglobin: 9.9 g/dL — ABNORMAL LOW (ref 13.0–17.0)
Potassium: 3.8 mmol/L (ref 3.5–5.1)
SODIUM: 146 mmol/L — AB (ref 135–145)

## 2017-04-14 LAB — PROTIME-INR
INR: 1.4
Prothrombin Time: 17.1 seconds — ABNORMAL HIGH (ref 11.4–15.2)

## 2017-04-14 LAB — PLATELET INHIBITION P2Y12: PLATELET FUNCTION P2Y12: 190 [PRU] — AB (ref 194–418)

## 2017-04-14 LAB — BASIC METABOLIC PANEL
Anion gap: 11 (ref 5–15)
BUN: 15 mg/dL (ref 6–20)
CHLORIDE: 105 mmol/L (ref 101–111)
CO2: 23 mmol/L (ref 22–32)
CREATININE: 1.12 mg/dL (ref 0.61–1.24)
Calcium: 9 mg/dL (ref 8.9–10.3)
GFR calc Af Amer: >60 mL/min (ref >60)
GFR calc non Af Amer: >60 mL/min (ref >60)
Glucose, Bld: 120 mg/dL — ABNORMAL HIGH (ref 65–99)
Potassium: 3.9 mmol/L (ref 3.5–5.1)
Sodium: 139 mmol/L (ref 135–145)

## 2017-04-14 LAB — MAGNESIUM: Magnesium: 2.8 mg/dL — ABNORMAL HIGH (ref 1.7–2.4)

## 2017-04-14 LAB — HEMOGLOBIN AND HEMATOCRIT, BLOOD
HCT: 31.9 % — ABNORMAL LOW (ref 39.0–52.0)
Hemoglobin: 11 g/dL — ABNORMAL LOW (ref 13.0–17.0)

## 2017-04-14 LAB — CREATININE, SERUM
Creatinine, Ser: 1.13 mg/dL (ref 0.61–1.24)
GFR calc Af Amer: >60 mL/min (ref >60)
GFR calc non Af Amer: >60 mL/min (ref >60)

## 2017-04-14 LAB — APTT: aPTT: 30 seconds (ref 24–36)

## 2017-04-14 LAB — PLATELET COUNT: Platelets: 152 10*3/uL (ref 150–400)

## 2017-04-14 SURGERY — CORONARY ARTERY BYPASS GRAFTING (CABG)
Anesthesia: General | Site: Chest

## 2017-04-14 MED ORDER — ALBUMIN HUMAN 5 % IV SOLN
250.0000 mL | INTRAVENOUS | Status: AC | PRN
  Administered 2017-04-14 (×3): 250 mL via INTRAVENOUS
  Filled 2017-04-14: qty 250

## 2017-04-14 MED ORDER — EPINEPHRINE PF 1 MG/10ML IJ SOSY
PREFILLED_SYRINGE | INTRAMUSCULAR | Status: DC | PRN
  Administered 2017-04-14 (×3): 10 ug via INTRAVENOUS

## 2017-04-14 MED ORDER — HEMOSTATIC AGENTS (NO CHARGE) OPTIME
TOPICAL | Status: DC | PRN
  Administered 2017-04-14 (×2): 1 via TOPICAL

## 2017-04-14 MED ORDER — ASPIRIN EC 325 MG PO TBEC
325.0000 mg | DELAYED_RELEASE_TABLET | Freq: Every day | ORAL | Status: DC
  Administered 2017-04-15: 325 mg via ORAL
  Filled 2017-04-14 (×2): qty 1

## 2017-04-14 MED ORDER — 0.9 % SODIUM CHLORIDE (POUR BTL) OPTIME
TOPICAL | Status: DC | PRN
  Administered 2017-04-14: 6000 mL

## 2017-04-14 MED ORDER — LACTATED RINGERS IV SOLN
INTRAVENOUS | Status: DC | PRN
  Administered 2017-04-14 (×3): via INTRAVENOUS

## 2017-04-14 MED ORDER — ALBUTEROL SULFATE HFA 108 (90 BASE) MCG/ACT IN AERS
INHALATION_SPRAY | RESPIRATORY_TRACT | Status: DC | PRN
  Administered 2017-04-14 (×5): 2 via RESPIRATORY_TRACT

## 2017-04-14 MED ORDER — MAGNESIUM SULFATE 4 GM/100ML IV SOLN
4.0000 g | Freq: Once | INTRAVENOUS | Status: AC
  Administered 2017-04-14: 4 g via INTRAVENOUS

## 2017-04-14 MED ORDER — LIDOCAINE HCL (CARDIAC) 20 MG/ML IV SOLN
INTRAVENOUS | Status: AC
  Filled 2017-04-14: qty 5

## 2017-04-14 MED ORDER — ACETAMINOPHEN 160 MG/5ML PO SOLN: 650.0000 mg | Freq: Once | ORAL | Status: AC

## 2017-04-14 MED ORDER — CHLORHEXIDINE GLUCONATE 0.12 % MT SOLN
15.0000 mL | OROMUCOSAL | Status: AC
  Administered 2017-04-14: 15 mL via OROMUCOSAL

## 2017-04-14 MED ORDER — NOREPINEPHRINE BITARTRATE 1 MG/ML IV SOLN
0.0000 ug/min | INTRAVENOUS | Status: AC
  Administered 2017-04-14: 2 ug/min via INTRAVENOUS

## 2017-04-14 MED ORDER — PROTAMINE SULFATE 10 MG/ML IV SOLN
INTRAVENOUS | Status: AC
  Filled 2017-04-14: qty 25

## 2017-04-14 MED ORDER — DEXMEDETOMIDINE HCL IN NACL 200 MCG/50ML IV SOLN
0.0000 ug/kg/h | INTRAVENOUS | Status: DC
  Administered 2017-04-14: 0.699 ug/kg/h via INTRAVENOUS

## 2017-04-14 MED ORDER — ARTIFICIAL TEARS OPHTHALMIC OINT
TOPICAL_OINTMENT | OPHTHALMIC | Status: AC
  Filled 2017-04-14: qty 3.5

## 2017-04-14 MED ORDER — MORPHINE SULFATE (PF) 2 MG/ML IV SOLN
1.0000 mg | INTRAVENOUS | Status: AC | PRN
  Administered 2017-04-14: 4 mg via INTRAVENOUS
  Administered 2017-04-15: 2 mg via INTRAVENOUS
  Filled 2017-04-14: qty 1

## 2017-04-14 MED ORDER — ONDANSETRON HCL 4 MG/2ML IJ SOLN
4.0000 mg | Freq: Four times a day (QID) | INTRAMUSCULAR | Status: DC | PRN
  Administered 2017-04-17: 4 mg via INTRAVENOUS
  Filled 2017-04-14: qty 2

## 2017-04-14 MED ORDER — THROMBIN 5000 UNITS EX SOLR
CUTANEOUS | Status: AC
  Filled 2017-04-14: qty 5000

## 2017-04-14 MED ORDER — MAGNESIUM SULFATE 4 GM/100ML IV SOLN
INTRAVENOUS | Status: AC
  Filled 2017-04-14: qty 100

## 2017-04-14 MED ORDER — SODIUM CHLORIDE 0.9% FLUSH: 3.0000 mL | INTRAVENOUS | Status: DC | PRN

## 2017-04-14 MED ORDER — ROCURONIUM BROMIDE 10 MG/ML (PF) SYRINGE
PREFILLED_SYRINGE | INTRAVENOUS | Status: AC
  Filled 2017-04-14: qty 5

## 2017-04-14 MED ORDER — ORAL CARE MOUTH RINSE
15.0000 mL | Freq: Four times a day (QID) | OROMUCOSAL | Status: DC
  Administered 2017-04-14 – 2017-04-15 (×3): 15 mL via OROMUCOSAL

## 2017-04-14 MED ORDER — ESMOLOL HCL 100 MG/10ML IV SOLN
INTRAVENOUS | Status: DC | PRN
  Administered 2017-04-14: 10 mg via INTRAVENOUS

## 2017-04-14 MED ORDER — PROTAMINE SULFATE 10 MG/ML IV SOLN
INTRAVENOUS | Status: DC | PRN
  Administered 2017-04-14: 270 mg via INTRAVENOUS

## 2017-04-14 MED ORDER — BISACODYL 5 MG PO TBEC
10.0000 mg | DELAYED_RELEASE_TABLET | Freq: Every day | ORAL | Status: DC
  Administered 2017-04-15 – 2017-04-18 (×4): 10 mg via ORAL
  Filled 2017-04-14 (×6): qty 2

## 2017-04-14 MED ORDER — MORPHINE SULFATE (PF) 2 MG/ML IV SOLN
2.0000 mg | INTRAVENOUS | Status: DC | PRN
  Administered 2017-04-15 – 2017-04-17 (×4): 2 mg via INTRAVENOUS
  Filled 2017-04-14 (×3): qty 1
  Filled 2017-04-14: qty 2
  Filled 2017-04-14: qty 1

## 2017-04-14 MED ORDER — DOCUSATE SODIUM 100 MG PO CAPS
200.0000 mg | ORAL_CAPSULE | Freq: Every day | ORAL | Status: DC
  Administered 2017-04-16 – 2017-04-18 (×3): 200 mg via ORAL
  Filled 2017-04-14 (×4): qty 2

## 2017-04-14 MED ORDER — METOPROLOL TARTRATE 25 MG/10 ML ORAL SUSPENSION: 12.5000 mg | Freq: Two times a day (BID) | ORAL | Status: DC

## 2017-04-14 MED ORDER — ROCURONIUM BROMIDE 10 MG/ML (PF) SYRINGE
PREFILLED_SYRINGE | INTRAVENOUS | Status: DC | PRN
  Administered 2017-04-14 (×2): 100 mg via INTRAVENOUS

## 2017-04-14 MED ORDER — VANCOMYCIN HCL IN DEXTROSE 1-5 GM/200ML-% IV SOLN
1000.0000 mg | Freq: Once | INTRAVENOUS | Status: AC
  Administered 2017-04-14: 1000 mg via INTRAVENOUS
  Filled 2017-04-14: qty 200

## 2017-04-14 MED ORDER — CHLORHEXIDINE GLUCONATE 0.12% ORAL RINSE (MEDLINE KIT)
15.0000 mL | Freq: Two times a day (BID) | OROMUCOSAL | Status: DC
  Administered 2017-04-14 – 2017-04-15 (×3): 15 mL via OROMUCOSAL

## 2017-04-14 MED ORDER — LACTATED RINGERS IV SOLN
INTRAVENOUS | Status: DC
  Administered 2017-04-14: 13:00:00 via INTRAVENOUS

## 2017-04-14 MED ORDER — SODIUM CHLORIDE 0.45 % IV SOLN
INTRAVENOUS | Status: DC | PRN
  Administered 2017-04-14: 13:00:00 via INTRAVENOUS

## 2017-04-14 MED ORDER — ARTIFICIAL TEARS OPHTHALMIC OINT
TOPICAL_OINTMENT | OPHTHALMIC | Status: DC | PRN
  Administered 2017-04-14: 1 via OPHTHALMIC

## 2017-04-14 MED ORDER — PROPOFOL 10 MG/ML IV BOLUS
INTRAVENOUS | Status: DC | PRN
  Administered 2017-04-14 (×2): 50 mg via INTRAVENOUS
  Administered 2017-04-14: 30 mg via INTRAVENOUS
  Administered 2017-04-14: 20 mg via INTRAVENOUS
  Administered 2017-04-14: 30 mg via INTRAVENOUS

## 2017-04-14 MED ORDER — ACETAMINOPHEN 160 MG/5ML PO SOLN: 1000.0000 mg | Freq: Four times a day (QID) | ORAL | Status: DC

## 2017-04-14 MED ORDER — TRAMADOL HCL 50 MG PO TABS
50.0000 mg | ORAL_TABLET | ORAL | Status: DC | PRN
  Administered 2017-04-16: 50 mg via ORAL
  Filled 2017-04-14: qty 1

## 2017-04-14 MED ORDER — SODIUM CHLORIDE 0.9% FLUSH
3.0000 mL | Freq: Two times a day (BID) | INTRAVENOUS | Status: DC
  Administered 2017-04-15 – 2017-04-18 (×4): 3 mL via INTRAVENOUS

## 2017-04-14 MED ORDER — ACETAMINOPHEN 650 MG RE SUPP
650.0000 mg | Freq: Once | RECTAL | Status: AC
  Administered 2017-04-14: 650 mg via RECTAL

## 2017-04-14 MED ORDER — SODIUM CHLORIDE 0.9 % IV SOLN: INTRAVENOUS | Status: DC

## 2017-04-14 MED ORDER — POTASSIUM CHLORIDE 10 MEQ/50ML IV SOLN
10.0000 meq | INTRAVENOUS | Status: AC
  Administered 2017-04-14 (×3): 10 meq via INTRAVENOUS

## 2017-04-14 MED ORDER — FENTANYL CITRATE (PF) 250 MCG/5ML IJ SOLN
INTRAMUSCULAR | Status: DC | PRN
  Administered 2017-04-14: 100 ug via INTRAVENOUS
  Administered 2017-04-14: 150 ug via INTRAVENOUS
  Administered 2017-04-14: 100 ug via INTRAVENOUS
  Administered 2017-04-14 (×2): 50 ug via INTRAVENOUS
  Administered 2017-04-14: 100 ug via INTRAVENOUS
  Administered 2017-04-14: 50 ug via INTRAVENOUS
  Administered 2017-04-14: 100 ug via INTRAVENOUS
  Administered 2017-04-14: 50 ug via INTRAVENOUS
  Administered 2017-04-14: 100 ug via INTRAVENOUS
  Administered 2017-04-14: 50 ug via INTRAVENOUS
  Administered 2017-04-14: 100 ug via INTRAVENOUS
  Administered 2017-04-14: 50 ug via INTRAVENOUS
  Administered 2017-04-14: 100 ug via INTRAVENOUS
  Administered 2017-04-14: 150 ug via INTRAVENOUS
  Administered 2017-04-14 (×2): 100 ug via INTRAVENOUS

## 2017-04-14 MED ORDER — LACTATED RINGERS IV SOLN: INTRAVENOUS | Status: DC

## 2017-04-14 MED ORDER — FAMOTIDINE IN NACL 20-0.9 MG/50ML-% IV SOLN
20.0000 mg | Freq: Two times a day (BID) | INTRAVENOUS | Status: AC
  Administered 2017-04-14 (×2): 20 mg via INTRAVENOUS
  Filled 2017-04-14: qty 50

## 2017-04-14 MED ORDER — SODIUM CHLORIDE 0.9 % IV SOLN
0.0000 ug/min | INTRAVENOUS | Status: DC
  Administered 2017-04-14: 20 ug/min via INTRAVENOUS
  Filled 2017-04-14: qty 2

## 2017-04-14 MED ORDER — SODIUM CHLORIDE 0.9% FLUSH: 10.0000 mL | INTRAVENOUS | Status: DC | PRN

## 2017-04-14 MED ORDER — CHLORHEXIDINE GLUCONATE CLOTH 2 % EX PADS
6.0000 | MEDICATED_PAD | Freq: Every day | CUTANEOUS | Status: DC
  Administered 2017-04-14 – 2017-04-15 (×2): 6 via TOPICAL

## 2017-04-14 MED ORDER — BISACODYL 10 MG RE SUPP: 10.0000 mg | Freq: Every day | RECTAL | Status: DC

## 2017-04-14 MED ORDER — PROPOFOL 10 MG/ML IV BOLUS
INTRAVENOUS | Status: AC
  Filled 2017-04-14: qty 20

## 2017-04-14 MED ORDER — CALCIUM CHLORIDE 10 % IV SOLN
INTRAVENOUS | Status: DC | PRN
  Administered 2017-04-14: 200 mg via INTRAVENOUS
  Administered 2017-04-14: 100 mg via INTRAVENOUS
  Administered 2017-04-14: 200 mg via INTRAVENOUS

## 2017-04-14 MED ORDER — HEPARIN SODIUM (PORCINE) 1000 UNIT/ML IJ SOLN
INTRAMUSCULAR | Status: AC
  Filled 2017-04-14: qty 1

## 2017-04-14 MED ORDER — ACETAMINOPHEN 500 MG PO TABS
1000.0000 mg | ORAL_TABLET | Freq: Four times a day (QID) | ORAL | Status: DC
  Administered 2017-04-15 – 2017-04-19 (×16): 1000 mg via ORAL
  Filled 2017-04-14 (×17): qty 2

## 2017-04-14 MED ORDER — NITROGLYCERIN IN D5W 200-5 MCG/ML-% IV SOLN: 0.0000 ug/min | INTRAVENOUS | Status: DC

## 2017-04-14 MED ORDER — ESMOLOL HCL 100 MG/10ML IV SOLN
INTRAVENOUS | Status: AC
  Filled 2017-04-14: qty 10

## 2017-04-14 MED ORDER — MIDAZOLAM HCL 10 MG/2ML IJ SOLN
INTRAMUSCULAR | Status: AC
  Filled 2017-04-14: qty 2

## 2017-04-14 MED ORDER — GELATIN ABSORBABLE MT POWD
OROMUCOSAL | Status: DC | PRN
  Administered 2017-04-14 (×4): 4 mL via TOPICAL

## 2017-04-14 MED ORDER — METOPROLOL TARTRATE 5 MG/5ML IV SOLN
2.5000 mg | INTRAVENOUS | Status: DC | PRN
  Administered 2017-04-17: 2.5 mg via INTRAVENOUS
  Filled 2017-04-14: qty 5

## 2017-04-14 MED ORDER — METOCLOPRAMIDE HCL 5 MG/ML IJ SOLN
10.0000 mg | Freq: Four times a day (QID) | INTRAMUSCULAR | Status: AC
  Administered 2017-04-14 – 2017-04-15 (×4): 10 mg via INTRAVENOUS
  Filled 2017-04-14 (×3): qty 2

## 2017-04-14 MED ORDER — MIDAZOLAM HCL 5 MG/5ML IJ SOLN
INTRAMUSCULAR | Status: DC | PRN
  Administered 2017-04-14: 2 mg via INTRAVENOUS
  Administered 2017-04-14: 4 mg via INTRAVENOUS
  Administered 2017-04-14: 3 mg via INTRAVENOUS
  Administered 2017-04-14: 1 mg via INTRAVENOUS

## 2017-04-14 MED ORDER — CEFAZOLIN SODIUM-DEXTROSE 2-4 GM/100ML-% IV SOLN
2.0000 g | Freq: Three times a day (TID) | INTRAVENOUS | Status: AC
  Administered 2017-04-14 – 2017-04-16 (×6): 2 g via INTRAVENOUS
  Filled 2017-04-14 (×6): qty 100

## 2017-04-14 MED ORDER — ALBUMIN HUMAN 5 % IV SOLN
INTRAVENOUS | Status: DC | PRN
  Administered 2017-04-14 (×2): via INTRAVENOUS

## 2017-04-14 MED ORDER — METOPROLOL TARTRATE 12.5 MG HALF TABLET
12.5000 mg | ORAL_TABLET | Freq: Two times a day (BID) | ORAL | Status: DC
  Administered 2017-04-15 (×2): 12.5 mg via ORAL
  Filled 2017-04-14 (×2): qty 1

## 2017-04-14 MED ORDER — HEPARIN SODIUM (PORCINE) 1000 UNIT/ML IJ SOLN
INTRAMUSCULAR | Status: DC | PRN
  Administered 2017-04-14: 27000 [IU] via INTRAVENOUS

## 2017-04-14 MED ORDER — SODIUM CHLORIDE 0.9 % IV SOLN: 250.0000 mL | INTRAVENOUS | Status: DC

## 2017-04-14 MED ORDER — INSULIN REGULAR BOLUS VIA INFUSION
0.0000 [IU] | Freq: Three times a day (TID) | INTRAVENOUS | Status: DC
  Filled 2017-04-14: qty 10

## 2017-04-14 MED ORDER — LACTATED RINGERS IV SOLN: 500.0000 mL | Freq: Once | INTRAVENOUS | Status: DC | PRN

## 2017-04-14 MED ORDER — THROMBIN 5000 UNITS EX SOLR
CUTANEOUS | Status: DC | PRN
  Administered 2017-04-14: 5000 [IU] via TOPICAL

## 2017-04-14 MED ORDER — PANTOPRAZOLE SODIUM 40 MG PO TBEC
40.0000 mg | DELAYED_RELEASE_TABLET | Freq: Every day | ORAL | Status: DC
  Administered 2017-04-15 – 2017-04-19 (×5): 40 mg via ORAL
  Filled 2017-04-14 (×5): qty 1

## 2017-04-14 MED ORDER — ASPIRIN 81 MG PO CHEW: 324.0000 mg | CHEWABLE_TABLET | Freq: Every day | ORAL | Status: DC

## 2017-04-14 MED ORDER — SODIUM CHLORIDE 0.9% FLUSH
10.0000 mL | Freq: Two times a day (BID) | INTRAVENOUS | Status: DC
  Administered 2017-04-14 – 2017-04-19 (×3): 10 mL

## 2017-04-14 MED ORDER — ALBUTEROL SULFATE HFA 108 (90 BASE) MCG/ACT IN AERS
INHALATION_SPRAY | RESPIRATORY_TRACT | Status: AC
  Filled 2017-04-14: qty 6.7

## 2017-04-14 MED ORDER — INSULIN ASPART 100 UNIT/ML ~~LOC~~ SOLN
0.0000 [IU] | SUBCUTANEOUS | Status: DC
  Administered 2017-04-14 – 2017-04-15 (×3): 2 [IU] via SUBCUTANEOUS

## 2017-04-14 MED ORDER — OXYCODONE HCL 5 MG PO TABS
5.0000 mg | ORAL_TABLET | ORAL | Status: DC | PRN
  Administered 2017-04-15 – 2017-04-16 (×4): 10 mg via ORAL
  Administered 2017-04-16 – 2017-04-17 (×2): 5 mg via ORAL
  Administered 2017-04-17 – 2017-04-19 (×4): 10 mg via ORAL
  Filled 2017-04-14 (×3): qty 2
  Filled 2017-04-14: qty 1
  Filled 2017-04-14 (×2): qty 2
  Filled 2017-04-14 (×2): qty 1
  Filled 2017-04-14 (×2): qty 2
  Filled 2017-04-14: qty 1

## 2017-04-14 MED ORDER — MIDAZOLAM HCL 2 MG/2ML IJ SOLN: 2.0000 mg | INTRAMUSCULAR | Status: DC | PRN

## 2017-04-14 MED ORDER — SODIUM CHLORIDE 0.9 % IV SOLN
INTRAVENOUS | Status: DC
  Administered 2017-04-15: 20 mL/h via INTRAVENOUS
  Administered 2017-04-15: 06:00:00 via INTRAVENOUS

## 2017-04-14 MED ORDER — FENTANYL CITRATE (PF) 250 MCG/5ML IJ SOLN
INTRAMUSCULAR | Status: AC
  Filled 2017-04-14: qty 30

## 2017-04-14 MED FILL — Magnesium Sulfate Inj 50%: INTRAMUSCULAR | Qty: 10 | Status: AC

## 2017-04-14 MED FILL — Heparin Sodium (Porcine) Inj 1000 Unit/ML: INTRAMUSCULAR | Qty: 30 | Status: AC

## 2017-04-14 MED FILL — Potassium Chloride Inj 2 mEq/ML: INTRAVENOUS | Qty: 40 | Status: AC

## 2017-04-14 SURGICAL SUPPLY — 74 items
ADAPTER CARDIO PERF ANTE/RETRO (ADAPTER) ×3 IMPLANT
BAG DECANTER FOR FLEXI CONT (MISCELLANEOUS) ×3 IMPLANT
BANDAGE ACE 4X5 VEL STRL LF (GAUZE/BANDAGES/DRESSINGS) ×3 IMPLANT
BANDAGE ACE 6X5 VEL STRL LF (GAUZE/BANDAGES/DRESSINGS) ×3 IMPLANT
BLADE STERNUM SYSTEM 6 (BLADE) ×3 IMPLANT
BLADE SURG 11 STRL SS (BLADE) ×3 IMPLANT
BNDG GAUZE ELAST 4 BULKY (GAUZE/BANDAGES/DRESSINGS) ×3 IMPLANT
CANISTER SUCT 3000ML PPV (MISCELLANEOUS) ×3 IMPLANT
CANNULA GUNDRY RCSP 15FR (MISCELLANEOUS) ×3 IMPLANT
CATH CPB KIT GERHARDT (MISCELLANEOUS) ×3 IMPLANT
CATH THORACIC 28FR (CATHETERS) ×3 IMPLANT
CRADLE DONUT ADULT HEAD (MISCELLANEOUS) ×3 IMPLANT
DERMABOND ADVANCED (GAUZE/BANDAGES/DRESSINGS) ×1
DERMABOND ADVANCED .7 DNX12 (GAUZE/BANDAGES/DRESSINGS) ×2 IMPLANT
DRAIN CHANNEL 28F RND 3/8 FF (WOUND CARE) ×3 IMPLANT
DRAPE CARDIOVASCULAR INCISE (DRAPES) ×1
DRAPE SLUSH/WARMER DISC (DRAPES) ×3 IMPLANT
DRAPE SRG 135X102X78XABS (DRAPES) ×2 IMPLANT
DRSG AQUACEL AG ADV 3.5X14 (GAUZE/BANDAGES/DRESSINGS) ×3 IMPLANT
ELECT BLADE 4.0 EZ CLEAN MEGAD (MISCELLANEOUS) ×3
ELECT REM PT RETURN 9FT ADLT (ELECTROSURGICAL) ×6
ELECTRODE BLDE 4.0 EZ CLN MEGD (MISCELLANEOUS) ×2 IMPLANT
ELECTRODE REM PT RTRN 9FT ADLT (ELECTROSURGICAL) ×4 IMPLANT
FELT TEFLON 1X6 (MISCELLANEOUS) ×6 IMPLANT
GAUZE SPONGE 4X4 12PLY STRL (GAUZE/BANDAGES/DRESSINGS) ×6 IMPLANT
GLOVE BIO SURGEON STRL SZ 6 (GLOVE) ×18 IMPLANT
GLOVE BIO SURGEON STRL SZ 6.5 (GLOVE) ×24 IMPLANT
GLOVE BIOGEL PI IND STRL 6 (GLOVE) ×4 IMPLANT
GLOVE BIOGEL PI INDICATOR 6 (GLOVE) ×2
GOWN STRL REUS W/ TWL LRG LVL3 (GOWN DISPOSABLE) ×16 IMPLANT
GOWN STRL REUS W/TWL LRG LVL3 (GOWN DISPOSABLE) ×8
HEMOSTAT POWDER SURGIFOAM 1G (HEMOSTASIS) ×9 IMPLANT
HEMOSTAT SURGICEL 2X14 (HEMOSTASIS) ×3 IMPLANT
KIT BASIN OR (CUSTOM PROCEDURE TRAY) ×3 IMPLANT
KIT CATH SUCT 8FR (CATHETERS) ×3 IMPLANT
KIT SUCTION CATH 14FR (SUCTIONS) ×9 IMPLANT
KIT TURNOVER KIT B (KITS) ×3 IMPLANT
KIT VASOVIEW HEMOPRO VH 3000 (KITS) ×3 IMPLANT
LEAD PACING MYOCARDI (MISCELLANEOUS) ×3 IMPLANT
MARKER GRAFT CORONARY BYPASS (MISCELLANEOUS) ×9 IMPLANT
NS IRRIG 1000ML POUR BTL (IV SOLUTION) ×18 IMPLANT
PACK E OPEN HEART (SUTURE) ×3 IMPLANT
PACK OPEN HEART (CUSTOM PROCEDURE TRAY) ×3 IMPLANT
PAD ARMBOARD 7.5X6 YLW CONV (MISCELLANEOUS) ×6 IMPLANT
PAD ELECT DEFIB RADIOL ZOLL (MISCELLANEOUS) ×3 IMPLANT
PENCIL BUTTON HOLSTER BLD 10FT (ELECTRODE) ×3 IMPLANT
PUNCH AORTIC ROTATE  4.5MM 8IN (MISCELLANEOUS) ×3 IMPLANT
SET CARDIOPLEGIA MPS 5001102 (MISCELLANEOUS) ×3 IMPLANT
SOLUTION ANTI FOG 6CC (MISCELLANEOUS) ×3 IMPLANT
SPOGE SURGIFLO 8M (HEMOSTASIS) ×1
SPONGE LAP 18X18 X RAY DECT (DISPOSABLE) ×9 IMPLANT
SPONGE SURGIFLO 8M (HEMOSTASIS) ×2 IMPLANT
SUT BONE WAX W31G (SUTURE) ×3 IMPLANT
SUT MNCRL AB 4-0 PS2 18 (SUTURE) ×3 IMPLANT
SUT PROLENE 3 0 SH1 36 (SUTURE) ×3 IMPLANT
SUT PROLENE 4 0 TF (SUTURE) ×6 IMPLANT
SUT PROLENE 5 0 C 1 36 (SUTURE) ×3 IMPLANT
SUT PROLENE 6 0 CC (SUTURE) ×9 IMPLANT
SUT PROLENE 7 0 BV1 MDA (SUTURE) ×6 IMPLANT
SUT PROLENE 8 0 BV175 6 (SUTURE) ×15 IMPLANT
SUT STEEL 6MS V (SUTURE) ×3 IMPLANT
SUT STEEL SZ 6 DBL 3X14 BALL (SUTURE) ×3 IMPLANT
SUT VIC AB 1 CTX 18 (SUTURE) ×6 IMPLANT
SUT VIC AB 2-0 CT1 27 (SUTURE) ×1
SUT VIC AB 2-0 CT1 TAPERPNT 27 (SUTURE) ×2 IMPLANT
SYSTEM SAHARA CHEST DRAIN ATS (WOUND CARE) ×3 IMPLANT
TAPE CLOTH SURG 4X10 WHT LF (GAUZE/BANDAGES/DRESSINGS) ×3 IMPLANT
TAPE PAPER 2X10 WHT MICROPORE (GAUZE/BANDAGES/DRESSINGS) ×3 IMPLANT
TOWEL GREEN STERILE (TOWEL DISPOSABLE) ×3 IMPLANT
TOWEL GREEN STERILE FF (TOWEL DISPOSABLE) ×3 IMPLANT
TRAY FOLEY SILVER 16FR TEMP (SET/KITS/TRAYS/PACK) ×3 IMPLANT
TUBING INSUFFLATION (TUBING) ×3 IMPLANT
UNDERPAD 30X30 (UNDERPADS AND DIAPERS) ×3 IMPLANT
WATER STERILE IRR 1000ML POUR (IV SOLUTION) ×6 IMPLANT

## 2017-04-14 NOTE — Progress Notes (Signed)
      [address]       [place] [id number]             [phone]     Pre Procedure note for inpatients:   [name] [name] has been scheduled for Procedure(s): CORONARY ARTERY BYPASS GRAFTING (CABG) (N/A) TRANSESOPHAGEAL ECHOCARDIOGRAM (TEE) (N/A) today. The various methods of treatment have been discussed with the patient. After consideration of the risks, benefits and treatment options the patient has consented to the planned procedure.   The patient has been seen and labs reviewed. There are no changes in the patient's condition to prevent proceeding with the planned procedure today.  Recent labs:  Lab Results  Component Value Date   WBC 12.4 (H) 04/14/2017   HGB 13.6 04/14/2017   HCT 41.7 04/14/2017   PLT 250 04/14/2017   GLUCOSE 120 (H) 04/14/2017   CHOL 126 04/11/2017   TRIG 167 (H) 04/11/2017   HDL 31 (L) 04/11/2017   LDLDIRECT 134.0 03/15/2015   LDLCALC 62 04/11/2017   ALT 63 04/13/2017   AST 132 (H) 04/13/2017   NA 139 04/14/2017   K 3.9 04/14/2017   CL 105 04/14/2017   CREATININE 1.12 04/14/2017   BUN 15 04/14/2017   CO2 23 04/14/2017   TSH 1.90 05/31/2015   INR 1.03 04/13/2017   HGBA1C 6.1 (H) 04/11/2017    [name] OvensEdward B [name], MD 04/14/2017 6:56 AM

## 2017-04-14 NOTE — Plan of Care (Signed)
Recent vancomycin level call to me on incorrect patient, pharmacy aware.

## 2017-04-14 NOTE — Brief Op Note (Signed)
04/08/2017 - 04/14/2017  7:32 AM  PATIENT:  [name] [name]  47 y.o. male  PRE-OPERATIVE DIAGNOSIS:  CAD  POST-OPERATIVE DIAGNOSIS:  CAD  PROCEDURE:  Procedure(s):  CORONARY ARTERY BYPASS GRAFTING x 3 -[name] to LAD -SVG to [name] -SVG to OM2  ENDOSCOPIC HARVEST GREATER SAPHENOUS VEIN -Right Thigh  TRANSESOPHAGEAL ECHOCARDIOGRAM (TEE) (N/A)  SURGEON:  Surgeon(s) and Role:    * Delight OvensGerhardt, Edward B, MD - Primary  PHYSICIAN ASSISTANT: Merlean Pizzini PA-C  ANESTHESIA:   general  EBL:  200 mL   BLOOD ADMINISTERED: CELLSAVER  DRAINS: Left Pleural Chest Tubes, Mediastinal Chest Drains   LOCAL MEDICATIONS USED:  NONE  SPECIMEN:  No Specimen  DISPOSITION OF SPECIMEN:  N/A  COUNTS:  YES  TOURNIQUET:  * No tourniquets in log *  DICTATION: .Dragon Dictation  PLAN OF CARE: Admit to inpatient   PATIENT DISPOSITION:  ICU - intubated and hemodynamically stable.   Delay start of Pharmacological VTE agent (>24hrs) due to surgical blood loss or risk of bleeding: yes

## 2017-04-14 NOTE — Progress Notes (Signed)
Patient got very irate with family and staff because he wants to fill out advanced directives. Patient states that if something happens to him, there will be a war within his family and he will not go to surgery unless chaplain comes to notarize advanced directives.

## 2017-04-14 NOTE — Progress Notes (Signed)
Patient called RN to request chaplin come in to explain advanced directives and ease his mind about surgery. Chaplain called and said he will be here.

## 2017-04-14 NOTE — Anesthesia Procedure Notes (Signed)
Central Venous Catheter Insertion Performed by: Beryle LatheBrock, Caedin Mogan E, MD, anesthesiologist Start/End3/27/2019 6:56 AM, 04/14/2017 6:58 AM Patient location: Pre-op. Preanesthetic checklist: patient identified, IV checked, site marked, risks and benefits discussed, surgical consent, monitors and equipment checked, pre-op evaluation, timeout performed and anesthesia consent Hand hygiene performed  and maximum sterile barriers used  Total catheter length 10. PA cath was placed.Swan type:thermodilution PA Cath depth:48 Procedure performed without using ultrasound guided technique. Attempts: 1 Patient tolerated the procedure well with no immediate complications.

## 2017-04-14 NOTE — Transfer of Care (Signed)
Immediate Anesthesia Transfer of Care Note  Patient: Toribio T Randon  Procedure(s) Performed: CORONARY ARTERY BYPASS GRAFTING (CABG) x three , using left internal mammary artery and right leg greater saphenous vein harvested endoscopically (N/A Chest) TRANSESOPHAGEAL ECHOCARDIOGRAM (TEE) (N/A )  Patient Location: SICU  Anesthesia Type:General  Level of Consciousness: sedated, patient cooperative and Patient remains intubated per anesthesia plan  Airway & Oxygen Therapy: Patient remains intubated per anesthesia plan and Patient placed on Ventilator (see vital sign flow sheet for setting)  Post-op Assessment: Report given to RN and Post -op Vital signs reviewed and stable  Post vital signs: Reviewed and stable  Last Vitals:  Vitals Value Taken Time  BP 98/70 04/14/2017 12:54 PM  Temp    Pulse 80 04/14/2017 12:56 PM  Resp 19 04/14/2017 12:56 PM  SpO2 100 % 04/14/2017 12:56 PM  Vitals shown include unvalidated device data.  Last Pain:  Vitals:   04/14/17 0531  TempSrc: Oral  PainSc:          Complications: No apparent anesthesia complications

## 2017-04-14 NOTE — Plan of Care (Signed)
  Problem: Cardiac: Goal: Hemodynamic stability will improve Outcome: Progressing   Problem: Respiratory: Goal: Respiratory status will improve Outcome: Progressing

## 2017-04-14 NOTE — Progress Notes (Signed)
NIF -15 VC .5L with poor effort. Placed pt back on previous PRVC/SIMV/PS settings.

## 2017-04-14 NOTE — Progress Notes (Signed)
Patient arrived via bed from OR to room 2 at 1245.  Placed on monitor, verified gtts as charted, verified and zeroed swan, obtaining CO/CI, placed on ventilator upon arrival by respiratory; report received from or Staff.  Labs obtained and sent to Lab.  Family was in to see patient and given information regarding visiting, contact numbers obtained, and status update was given at that time.  Titration of gtts as charted in relation to BP/MAP.  Monitoring as per protocol.

## 2017-04-14 NOTE — Progress Notes (Signed)
Patient ID: [name], male   DOB: 07/04/1970, 47 y.o.   MRN: [id number]  TCTS Evening Rounds:   Hemodynamically stable  CI = 2.4  Has started to wake up on vent but still sleepy and failed the first attempt to wean.  Urine output good  CT output low  CBC    Component Value Date/Time   WBC 15.5 (H) 04/14/2017 1252   RBC 3.83 (L) 04/14/2017 1252   HGB 10.5 (L) 04/14/2017 1818   HCT 31.0 (L) 04/14/2017 1818   PLT 133 (L) 04/14/2017 1252   MCV 86.9 04/14/2017 1252   MCV 89.3 03/28/2013 1352   MCH 28.5 04/14/2017 1252   MCHC 32.7 04/14/2017 1252   RDW 14.0 04/14/2017 1252   LYMPHSABS 3,689 05/31/2015 1219   MONOABS 1,309 (H) 05/31/2015 1219   EOSABS 357 05/31/2015 1219   BASOSABS 0 05/31/2015 1219     BMET    Component Value Date/Time   NA 140 04/14/2017 1818   K 5.5 (H) 04/14/2017 1818   CL 107 04/14/2017 1818   CO2 23 04/14/2017 0601   GLUCOSE 116 (H) 04/14/2017 1818   BUN 12 04/14/2017 1818   CREATININE 1.10 04/14/2017 1818   CREATININE 1.22 05/31/2015 1219   CALCIUM 9.0 04/14/2017 0601   GFRNONAA >60 04/14/2017 0601   GFRAA >60 04/14/2017 0601     A/P:  Stable postop course. Continue current plans. Wean vent when more awake.

## 2017-04-14 NOTE — Anesthesia Procedure Notes (Signed)
Central Venous Catheter Insertion Performed by: Beryle LatheBrock, Griffyn Kucinski E, MD, anesthesiologist Start/End3/27/2019 6:48 AM, 04/14/2017 6:58 AM Patient location: Pre-op. Preanesthetic checklist: patient identified, IV checked, site marked, risks and benefits discussed, surgical consent, monitors and equipment checked, pre-op evaluation, timeout performed and anesthesia consent Position: Trendelenburg Lidocaine 1% used for infiltration and patient sedated Hand hygiene performed , maximum sterile barriers used  and Seldinger technique used Catheter size: 8.5 Fr Total catheter length 10. Central line was placed.Sheath introducer Swan type:thermodilution PA Cath depth:50 Procedure performed using ultrasound guided technique. Ultrasound Notes:anatomy identified, needle tip was noted to be adjacent to the nerve/plexus identified, no ultrasound evidence of intravascular and/or intraneural injection and image(s) printed for medical record Attempts: 1 Following insertion, line sutured, dressing applied and Biopatch. Post procedure assessment: blood return through all ports, free fluid flow and no air  Patient tolerated the procedure well with no immediate complications.

## 2017-04-14 NOTE — Anesthesia Procedure Notes (Signed)
Arterial Line Insertion Start/End3/27/2019 6:40 AM, 04/14/2017 6:50 AM Performed by: Rosiland OzMeyers, Rhodesia Stanger, CRNA, CRNA  Patient location: Pre-op. Preanesthetic checklist: patient identified, IV checked, site marked, risks and benefits discussed, surgical consent, monitors and equipment checked, pre-op evaluation, timeout performed and anesthesia consent Lidocaine 1% used for infiltration and patient sedated Left, radial was placed Catheter size: 20 G Hand hygiene performed , maximum sterile barriers used  and Seldinger technique used  Attempts: 1 Procedure performed without using ultrasound guided technique. Following insertion, dressing applied and Biopatch. Post procedure assessment: normal  Patient tolerated the procedure well with no immediate complications.

## 2017-04-14 NOTE — Anesthesia Procedure Notes (Signed)
Procedure Name: Intubation Date/Time: 04/14/2017 7:51 AM Performed by: Rosiland OzMeyers, Bayli Quesinberry, CRNA Pre-anesthesia Checklist: Patient identified, Emergency Drugs available, Suction available, Timeout performed and Patient being monitored Patient Re-evaluated:Patient Re-evaluated prior to induction Oxygen Delivery Method: Circle system utilized Preoxygenation: Pre-oxygenation with 100% oxygen Induction Type: IV induction Ventilation: Mask ventilation without difficulty Laryngoscope Size: Miller and 3 Grade View: Grade I Tube type: Oral Tube size: 8.0 mm Number of attempts: 1 Airway Equipment and Method: Stylet Placement Confirmation: ETT inserted through vocal cords under direct vision,  positive ETCO2 and breath sounds checked- equal and bilateral Secured at: 22 cm Tube secured with: Tape Dental Injury: Teeth and Oropharynx as per pre-operative assessment

## 2017-04-14 NOTE — Procedures (Signed)
Extubation Procedure Note  Patient Details:   Name: [name] [name] DOB: 04/20/1970 MRN: [id number]   Airway Documentation:  Airway 8 mm (Active)  Secured at (cm) 22 cm 04/14/2017  8:01 PM  Measured From Lips 04/14/2017  8:01 PM  Secured Location Center 04/14/2017  8:01 PM  Secured By [employer] Tube Holder 04/14/2017  8:01 PM  Tube Holder Repositioned Yes 04/14/2017  8:01 PM  Cuff Pressure (cm H2O) 23 cm H2O 04/14/2017  8:01 PM  Site Condition Dry 04/14/2017  8:01 PM    Evaluation  O2 sats: stable throughout Complications: No apparent complications Patient did tolerate procedure well. Bilateral Breath Sounds: Clear, Diminished   Yes, patient was able to vocalize name.   Patient performed NIF -20 and VC 0.8L. Patient extubated to 4L nasal cannula with no apparent complications.   Farris HasChelsea M Kale Dols 04/14/2017, 11:23 PM

## 2017-04-14 NOTE — Progress Notes (Signed)
Patient transported to pre op area, chaplain came by and will help patient fill out AD's after surgery.

## 2017-04-14 NOTE — Progress Notes (Signed)
Patient is now calm and apologetic about behavior. OR RN called for report, will relay message to patient. Also, chaplain paged again.

## 2017-04-14 NOTE — Plan of Care (Signed)
  Problem: Cardiovascular: Goal: Ability to achieve and maintain adequate cardiovascular perfusion will improve Outcome: Progressing Goal: Vascular access site(s) Level 0-1 will be maintained Outcome: Progressing   Problem: Skin Integrity: Goal: Wound healing without signs and symptoms of infection Outcome: Progressing Goal: Risk for impaired skin integrity will decrease Outcome: Progressing   Problem: Education: Goal: Ability to state activities that reduce stress will improve Outcome: Progressing   Problem: Coping: Goal: Ability to identify and develop effective coping behavior will improve Outcome: Progressing   Problem: Self-Concept: Goal: Ability to identify factors that promote anxiety will improve Outcome: Progressing Goal: Level of anxiety will decrease Outcome: Progressing Goal: Ability to modify response to factors that promote anxiety will improve Outcome: Progressing

## 2017-04-14 NOTE — Progress Notes (Signed)
Patient awake, following commands, moves all extremities, titrating precedex as charted.  At present patient is cooperative.

## 2017-04-14 NOTE — Plan of Care (Signed)
CRITICAL VALUE ALERT  Critical Value:  Vancomycin level 23  Date & Time Notied:  04/14/2017@1444    Provider Notified: Pharmacy/ John  Orders Received/Actions taken: awaiting orders

## 2017-04-14 NOTE — Progress Notes (Signed)
  Echocardiogram Echocardiogram Transesophageal has been performed.  [name] [name] [name] [name] 04/14/2017, 9:55 AM

## 2017-04-14 NOTE — OR Nursing (Signed)
11:45am - 45 minute call to SICU charge nurse 12:10pnm - 20 minute call to SICU charge nurse

## 2017-04-15 ENCOUNTER — Encounter (HOSPITAL_COMMUNITY): Payer: Self-pay | Admitting: Cardiothoracic Surgery

## 2017-04-15 ENCOUNTER — Inpatient Hospital Stay (HOSPITAL_COMMUNITY): Payer: Self-pay

## 2017-04-15 LAB — CBC
HCT: 31.5 % — ABNORMAL LOW (ref 39.0–52.0)
HCT: 33 % — ABNORMAL LOW (ref 39.0–52.0)
HEMATOCRIT: 21.1 % — AB (ref 39.0–52.0)
Hemoglobin: 10.4 g/dL — ABNORMAL LOW (ref 13.0–17.0)
Hemoglobin: 11 g/dL — ABNORMAL LOW (ref 13.0–17.0)
Hemoglobin: 6.9 g/dL — CL (ref 13.0–17.0)
MCH: 29 pg (ref 26.0–34.0)
MCH: 29.1 pg (ref 26.0–34.0)
MCH: 29.6 pg (ref 26.0–34.0)
MCHC: 33 g/dL (ref 30.0–36.0)
MCHC: 32.7 g/dL (ref 30.0–36.0)
MCHC: 33.3 g/dL (ref 30.0–36.0)
MCV: 87.7 fL (ref 78.0–100.0)
MCV: 88.7 fL (ref 78.0–100.0)
MCV: 89 fL (ref 78.0–100.0)
PLATELETS: 148 10*3/uL — AB (ref 150–400)
Platelets: 143 10*3/uL — ABNORMAL LOW (ref 150–400)
Platelets: 94 10*3/uL — ABNORMAL LOW (ref 150–400)
RBC: 3.59 MIL/uL — ABNORMAL LOW (ref 4.22–5.81)
RBC: 2.37 MIL/uL — AB (ref 4.22–5.81)
RBC: 3.72 MIL/uL — ABNORMAL LOW (ref 4.22–5.81)
RDW: 14.8 % (ref 11.5–15.5)
RDW: 15 % (ref 11.5–15.5)
RDW: 15.2 % (ref 11.5–15.5)
WBC: 15 10*3/uL — ABNORMAL HIGH (ref 4.0–10.5)
WBC: 12.2 10*3/uL — AB (ref 4.0–10.5)
WBC: 18.2 10*3/uL — ABNORMAL HIGH (ref 4.0–10.5)

## 2017-04-15 LAB — POCT I-STAT, CHEM 8
BUN: 9 mg/dL (ref 6–20)
CALCIUM ION: 1.13 mmol/L — AB (ref 1.15–1.40)
CHLORIDE: 101 mmol/L (ref 101–111)
Creatinine, Ser: 0.9 mg/dL (ref 0.61–1.24)
Glucose, Bld: 176 mg/dL — ABNORMAL HIGH (ref 65–99)
HCT: 33 % — ABNORMAL LOW (ref 39.0–52.0)
Hemoglobin: 11.2 g/dL — ABNORMAL LOW (ref 13.0–17.0)
POTASSIUM: 4 mmol/L (ref 3.5–5.1)
Sodium: 138 mmol/L (ref 135–145)
TCO2: 24 mmol/L (ref 22–32)

## 2017-04-15 LAB — BASIC METABOLIC PANEL
Anion gap: 9 (ref 5–15)
BUN: 10 mg/dL (ref 6–20)
CALCIUM: 8.1 mg/dL — AB (ref 8.9–10.3)
CO2: 23 mmol/L (ref 22–32)
Chloride: 106 mmol/L (ref 101–111)
Creatinine, Ser: 1.01 mg/dL (ref 0.61–1.24)
GFR calc Af Amer: >60 mL/min (ref >60)
GLUCOSE: 132 mg/dL — AB (ref 65–99)
Potassium: 4.2 mmol/L (ref 3.5–5.1)
Sodium: 138 mmol/L (ref 135–145)

## 2017-04-15 LAB — POCT I-STAT 3, ART BLOOD GAS (G3+)
ACID-BASE DEFICIT: 1 mmol/L (ref 0.0–2.0)
ACID-BASE DEFICIT: 3 mmol/L — AB (ref 0.0–2.0)
BICARBONATE: 23.1 mmol/L (ref 20.0–28.0)
Bicarbonate: 25 mmol/L (ref 20.0–28.0)
O2 Saturation: 99 %
O2 Saturation: 99 %
TCO2: 24 mmol/L (ref 22–32)
TCO2: 26 mmol/L (ref 22–32)
pCO2 arterial: 43.6 mmHg (ref 32.0–48.0)
pCO2 arterial: 46.4 mmHg (ref 32.0–48.0)
pH, Arterial: 7.334 — ABNORMAL LOW (ref 7.350–7.450)
pH, Arterial: 7.338 — ABNORMAL LOW (ref 7.350–7.450)
pO2, Arterial: 141 mmHg — ABNORMAL HIGH (ref 83.0–108.0)
pO2, Arterial: 157 mmHg — ABNORMAL HIGH (ref 83.0–108.0)

## 2017-04-15 LAB — GLUCOSE, CAPILLARY
GLUCOSE-CAPILLARY: 141 mg/dL — AB (ref 65–99)
GLUCOSE-CAPILLARY: 129 mg/dL — AB (ref 65–99)
Glucose-Capillary: 114 mg/dL — ABNORMAL HIGH (ref 65–99)
Glucose-Capillary: 119 mg/dL — ABNORMAL HIGH (ref 65–99)
Glucose-Capillary: 125 mg/dL — ABNORMAL HIGH (ref 65–99)
Glucose-Capillary: 126 mg/dL — ABNORMAL HIGH (ref 65–99)
Glucose-Capillary: 139 mg/dL — ABNORMAL HIGH (ref 65–99)

## 2017-04-15 LAB — CREATININE, SERUM: Creatinine, Ser: 1.07 mg/dL (ref 0.61–1.24)

## 2017-04-15 LAB — MAGNESIUM
MAGNESIUM: 2.1 mg/dL (ref 1.7–2.4)
Magnesium: 2.1 mg/dL (ref 1.7–2.4)

## 2017-04-15 MED ORDER — ENOXAPARIN SODIUM 40 MG/0.4ML ~~LOC~~ SOLN
40.0000 mg | Freq: Every day | SUBCUTANEOUS | Status: DC
  Administered 2017-04-15 – 2017-04-18 (×4): 40 mg via SUBCUTANEOUS
  Filled 2017-04-15 (×4): qty 0.4

## 2017-04-15 MED ORDER — INSULIN ASPART 100 UNIT/ML ~~LOC~~ SOLN
0.0000 [IU] | SUBCUTANEOUS | Status: DC
  Administered 2017-04-15: 2 [IU] via SUBCUTANEOUS

## 2017-04-15 MED ORDER — INSULIN ASPART 100 UNIT/ML ~~LOC~~ SOLN
0.0000 [IU] | Freq: Three times a day (TID) | SUBCUTANEOUS | Status: DC
  Administered 2017-04-15: 2 [IU] via SUBCUTANEOUS

## 2017-04-15 MED FILL — Electrolyte-R (PH 7.4) Solution: INTRAVENOUS | Qty: 3000 | Status: AC

## 2017-04-15 MED FILL — Heparin Sodium (Porcine) Inj 1000 Unit/ML: INTRAMUSCULAR | Qty: 10 | Status: AC

## 2017-04-15 MED FILL — Sodium Chloride IV Soln 0.9%: INTRAVENOUS | Qty: 2000 | Status: AC

## 2017-04-15 MED FILL — Mannitol IV Soln 20%: INTRAVENOUS | Qty: 500 | Status: AC

## 2017-04-15 MED FILL — Sodium Bicarbonate IV Soln 8.4%: INTRAVENOUS | Qty: 50 | Status: AC

## 2017-04-15 MED FILL — Lidocaine HCl IV Inj 20 MG/ML: INTRAVENOUS | Qty: 5 | Status: AC

## 2017-04-15 NOTE — Progress Notes (Signed)
Pt is very non-complaint with IS, When attempted to stand, pt refused to do so Pt was offered morphine for pain and he stated no b/c it makes him sleepy but will not take sips of water, so nurse can feel safe about giving pills.

## 2017-04-15 NOTE — Plan of Care (Signed)
  Problem: Education: Goal: Understanding of cardiac disease, CV risk reduction, and recovery process will improve Outcome: Progressing   Problem: Activity: Goal: Ability to tolerate increased activity will improve Outcome: Progressing   Problem: Health Behavior/Discharge Planning: Goal: Ability to safely manage health-related needs after discharge will improve Outcome: Progressing   Problem: Education: Goal: Understanding of CV disease, CV risk reduction, and recovery process will improve Outcome: Progressing   Problem: Activity: Goal: Ability to return to baseline activity level will improve Outcome: Progressing   Problem: Cardiovascular: Goal: Ability to achieve and maintain adequate cardiovascular perfusion will improve Outcome: Progressing Goal: Vascular access site(s) Level 0-1 will be maintained Outcome: Progressing   Problem: Health Behavior/Discharge Planning: Goal: Ability to safely manage health-related needs after discharge will improve Outcome: Progressing   Problem: Cardiac: Goal: Hemodynamic stability will improve Outcome: Progressing   Problem: Respiratory: Goal: Respiratory status will improve Outcome: Progressing   Problem: Skin Integrity: Goal: Wound healing without signs and symptoms of infection Outcome: Progressing Goal: Risk for impaired skin integrity will decrease Outcome: Progressing   Problem: Urinary Elimination: Goal: Ability to achieve and maintain adequate renal perfusion and functioning will improve Outcome: Progressing

## 2017-04-15 NOTE — Discharge Instructions (Signed)

## 2017-04-15 NOTE — Care Management Note (Signed)
Case Management Note Donn PieriniKristi Deonna Krummel RN, BSN Unit 4E-Case Manager-- 2H coverage [phone]  Patient Details  Name: Gardiner BarefootClinton T Lean MRN: [id number] Date of Birth: 10/28/1970  Subjective/Objective: Pt admitted with NSTEMI, MVD- s/p CABGx3 on 04/14/17                   Action/Plan: PTA pt lived at home,  PCP-Regina Baity,  CM to follow for transition of care needs  Expected Discharge Date:                  Expected Discharge Plan:  Home/Self Care  In-House Referral:     Discharge planning Services  CM Consult  Post Acute Care Choice:    Choice offered to:     DME Arranged:    DME Agency:     HH Arranged:    HH Agency:     Status of Service:  In process, will continue to follow  If discussed at Long Length of Stay Meetings, dates discussed:    Discharge Disposition:   Additional Comments:  Darrold SpanWebster, Cruise Baumgardner Hall, RN 04/15/2017, 10:15 AM

## 2017-04-15 NOTE — Progress Notes (Signed)
Patient ID: [name], male   DOB: 01/15/1971, 47 y.o.   MRN: [id number] EVENING ROUNDS NOTE :     [address]       [place] [id number]             [phone]                 1 Day Post-Op Procedure(s) (LRB): CORONARY ARTERY BYPASS GRAFTING (CABG) x three , using left internal mammary artery and right leg greater saphenous vein harvested endoscopically (N/A) TRANSESOPHAGEAL ECHOCARDIOGRAM (TEE) (N/A)  Total Length of Stay:  LOS: 6 days  BP 127/85   Pulse 100   Temp 99.4 F (37.4 C) (Oral)   Resp (!) 22   Ht 5\' 9"  (1.753 m)   Wt 189 lb 9.5 oz (86 kg)   SpO2 98%   BMI 28.00 kg/m   .Intake/Output      03/27 0701 - 03/28 0700 03/28 0701 - 03/29 0700   P.O.  420   I.V. (mL/kg) 3463 (40.3) 300 (3.5)   Blood 450    IV Piggyback 1450 100   Total Intake(mL/kg) 5363 (62.4) 820 (9.5)   Urine (mL/kg/hr) 3200 (1.6) 1500 (1.6)   Blood 200    Chest Tube 366    Total Output 3766 1500   Net +1597 -680          . sodium chloride Stopped (04/15/17 0946)  . sodium chloride    . sodium chloride 20 mL/hr (04/15/17 0620)  .  ceFAZolin (ANCEF) IV Stopped (04/15/17 1456)  . dexmedetomidine (PRECEDEX) IV infusion 0.1 mcg/kg/hr (04/15/17 0039)  . lactated ringers    . lactated ringers 20 mL/hr at 04/14/17 1700  . lactated ringers 20 mL/hr at 04/15/17 1800     Lab Results  Component Value Date   WBC 18.2 (H) 04/15/2017   HGB 11.0 (L) 04/15/2017   HCT 33.0 (L) 04/15/2017   PLT 143 (L) 04/15/2017   GLUCOSE 176 (H) 04/15/2017   CHOL 126 04/11/2017   TRIG 167 (H) 04/11/2017   HDL 31 (L) 04/11/2017   LDLDIRECT 134.0 03/15/2015   LDLCALC 62 04/11/2017   ALT 63 04/13/2017   AST 132 (H) 04/13/2017   NA 138 04/15/2017   K 4.0 04/15/2017   CL 101 04/15/2017   CREATININE 0.90 04/15/2017   BUN 9 04/15/2017   CO2 23 04/15/2017   TSH 1.90 05/31/2015   INR 1.40 04/14/2017   HGBA1C 6.1 (H) 04/11/2017   Stable day chest tubes are out ambulated Spontaneous  diuresis Maintaining sinus rhythm   Delight OvensEdward B Radie Berges MD  Beeper [phone] Office [phone] 04/15/2017 6:14 PM  Both tubes

## 2017-04-15 NOTE — Plan of Care (Signed)
Repeat Hgb 11, edit sheet to lab

## 2017-04-15 NOTE — Plan of Care (Signed)
CRITICAL VALUE ALERT  Critical Value:  Hgb 6.9 Date & Time Notied:  04/15/2017 1630  Provider Notified: Tyrone SageGerhardt  Orders Received/Actions taken: repeat with new sample believe prior sample hemolyzed
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
## 2017-04-15 NOTE — Progress Notes (Addendum)
TCTS DAILY ICU PROGRESS NOTE                   [address]            [place] [address]          [phone]   1 Day Post-Op Procedure(s) (LRB): CORONARY ARTERY BYPASS GRAFTING (CABG) x three , using left internal mammary artery and right leg greater saphenous vein harvested endoscopically (N/A) TRANSESOPHAGEAL ECHOCARDIOGRAM (TEE) (N/A)  Total Length of Stay:  LOS: 6 days   Subjective:  Patient complains of back pain.  He is being rude to nursing staff and states he will use incentive and walk when he wants to walk.  Complains pain medication isn't really working, however he is refusing to take oral pain medications at this time.  Has only had 2 doses of Morphine.  Objective: Vital signs in last 24 hours: Temp:  [96.1 F (35.6 C)-100.2 F (37.9 C)] 100.2 F (37.9 C) (03/28 0700) Pulse Rate:  [74-113] 98 (03/28 0700) Cardiac Rhythm: Normal sinus rhythm;Sinus tachycardia (03/28 0400) Resp:  [10-32] 23 (03/28 0700) BP: (83-128)/(49-75) 101/75 (03/28 0700) SpO2:  [92 %-100 %] 100 % (03/28 0700) Arterial Line BP: (89-150)/(50-79) 131/65 (03/28 0700) FiO2 (%):  [40 %-50 %] 40 % (03/27 2240) Weight:  [189 lb 9.5 oz (86 kg)] 189 lb 9.5 oz (86 kg) (03/28 0500)  Filed Weights   04/13/17 0548 04/14/17 0531 04/15/17 0500  Weight: 173 lb 6.4 oz (78.7 kg) 174 lb 3.2 oz (79 kg) 189 lb 9.5 oz (86 kg)    Weight change: 15 lb 6.3 oz (6.983 kg)   Hemodynamic parameters for last 24 hours: PAP: (16-42)/(7-27) 31/16 CO:  [4.3 L/min-5.9 L/min] 5.9 L/min CI:  [2.2 L/min/m2-3 L/min/m2] 3 L/min/m2  Intake/Output from previous day: 03/27 0701 - 03/28 0700 In: 5363 [I.V.:3463; Blood:450; IV Piggyback:1450] Out: 0076 [Urine:3200; Blood:200; Chest Tube:366]  Current Meds: Scheduled Meds: . acetaminophen  1,000 mg Oral Q6H   Or  . acetaminophen (TYLENOL) oral liquid 160 mg/5 mL  1,000 mg Per Tube Q6H  . aspirin EC  325 mg Oral Daily   Or  . aspirin  324 mg Per Tube Daily   . atorvastatin  80 mg Oral QHS  . bisacodyl  10 mg Oral Daily   Or  . bisacodyl  10 mg Rectal Daily  . chlorhexidine gluconate (MEDLINE KIT)  15 mL Mouth Rinse BID  . Chlorhexidine Gluconate Cloth  6 each Topical Daily  . docusate sodium  200 mg Oral Daily  . enoxaparin (LOVENOX) injection  40 mg Subcutaneous QHS  . insulin aspart  0-24 Units Subcutaneous Q4H  . insulin aspart  0-24 Units Subcutaneous Q4H  . mouth rinse  15 mL Mouth Rinse QID  . metoCLOPramide (REGLAN) injection  10 mg Intravenous Q6H  . metoprolol tartrate  12.5 mg Oral BID   Or  . metoprolol tartrate  12.5 mg Per Tube BID  . [START ON 04/16/2017] pantoprazole  40 mg Oral Daily  . sodium chloride flush  10-40 mL Intracatheter Q12H  . sodium chloride flush  3 mL Intravenous Q12H   Continuous Infusions: . sodium chloride 20 mL/hr at 04/15/17 0200  . sodium chloride    . sodium chloride 20 mL/hr (04/15/17 0620)  . albumin human    .  ceFAZolin (ANCEF) IV 2 g (04/15/17 0620)  . dexmedetomidine (PRECEDEX) IV infusion 0.1 mcg/kg/hr (04/15/17 0039)  . lactated ringers    .  lactated ringers 20 mL/hr at 04/14/17 1700  . lactated ringers 20 mL/hr at 04/15/17 0200  . nitroGLYCERIN Stopped (04/15/17 0431)  . phenylephrine (NEO-SYNEPHRINE) Adult infusion Stopped (04/15/17 0000)   PRN Meds:.sodium chloride, albumin human, lactated ringers, metoprolol tartrate, midazolam, morphine injection, ondansetron (ZOFRAN) IV, oxyCODONE, sodium chloride flush, sodium chloride flush, traMADol  General appearance: alert, cooperative and no distress Heart: RRR,tachy Lungs: clear to auscultation bilaterally Abdomen: soft, non-tender; bowel sounds normal; no masses,  no organomegaly Extremities: edema trace Wound: aquacel on sternum, EVH site clean and dry  Lab Results: CBC: Recent Labs    04/14/17 1744 04/14/17 1818 04/15/17 0402  WBC 21.1*  --  15.0*  HGB 11.1* 10.5* 10.4*  HCT 32.7* 31.0* 31.5*  PLT 148*  --  148*   BMET:    Recent Labs    04/14/17 0601  04/14/17 1818 04/15/17 0402  NA 139   < > 140 138  K 3.9   < > 5.5* 4.2  CL 105   < > 107 106  CO2 23  --   --  23  GLUCOSE 120*   < > 116* 132*  BUN 15   < > 12 10  CREATININE 1.12   < > 1.10 1.01  CALCIUM 9.0  --   --  8.1*   < > = values in this interval not displayed.    CMET: Lab Results  Component Value Date   WBC 15.0 (H) 04/15/2017   HGB 10.4 (L) 04/15/2017   HCT 31.5 (L) 04/15/2017   PLT 148 (L) 04/15/2017   GLUCOSE 132 (H) 04/15/2017   CHOL 126 04/11/2017   TRIG 167 (H) 04/11/2017   HDL 31 (L) 04/11/2017   LDLDIRECT 134.0 03/15/2015   LDLCALC 62 04/11/2017   ALT 63 04/13/2017   AST 132 (H) 04/13/2017   NA 138 04/15/2017   K 4.2 04/15/2017   CL 106 04/15/2017   CREATININE 1.01 04/15/2017   BUN 10 04/15/2017   CO2 23 04/15/2017   TSH 1.90 05/31/2015   INR 1.40 04/14/2017   HGBA1C 6.1 (H) 04/11/2017      PT/INR:  Recent Labs    04/14/17 1252  LABPROT 17.1*  INR 1.40   Radiology: Dg Chest Port 1 View  Result Date: 04/14/2017 CLINICAL DATA:  Status post coronary artery bypass graft. EXAM: PORTABLE CHEST 1 VIEW COMPARISON:  Radiographs of April 08, 2017. FINDINGS: Endotracheal and nasogastric tubes are in grossly good position. Right internal jugular Swan-Ganz catheter is noted with tip directed into right pulmonary artery. Left-sided chest tube is noted without pneumothorax. Mild right upper lobe subsegmental atelectasis is noted. Bony thorax is unremarkable. IMPRESSION: Endotracheal and nasogastric tubes in grossly good position. Left-sided chest tube without evidence of pneumothorax. Right upper lobe subsegmental atelectasis. Electronically Signed   By: Marijo Conception, M.D.   On: 04/14/2017 13:03     Assessment/Plan: S/P Procedure(s) (LRB): CORONARY ARTERY BYPASS GRAFTING (CABG) x three , using left internal mammary artery and right leg greater saphenous vein harvested endoscopically (N/A) TRANSESOPHAGEAL  ECHOCARDIOGRAM (TEE) (N/A)  1. CV- Sinus Tach off all drips- will start Lopressor 12.5 mg BID 2. Pulm- aggressive pulm toilet, CXR looks good some atelectasis bilaterally, will d/c Mediastinal Chest tubes today, likely remove pleural as well will discuss with Dr. Servando Snare 3. Renal- creatinine at 1.01, weight is elevated about 10 lbs above baseline, hold off on Lasix today, if able will start tomorrow 4. Expected post operative blood loss anemia, mild Hgb  at 10.4 5. Expected Thrombocytopenia, mild at 148 will monitor 6. CBGs controlled, off insulin drip, will continue SSIP for now 7. Dispo- patient stable, Sinus tach start Lopressor today, patient is rude to nursing staff and he was educated on importance of doing what is asked of him to progress his medical care, POD #1 progression orders   Ellwood Handler 04/15/2017 7:32 AM   D/c tubes today OOB Pulmonary toilet  I have seen and examined [name] [name] and agree with the above assessment  and plan.  Grace Isaac MD Beeper [phone] Office [phone] 04/15/2017 8:19 AM

## 2017-04-15 NOTE — Anesthesia Postprocedure Evaluation (Signed)
Anesthesia Post Note  Patient: [name] [name]  Procedure(s) Performed: CORONARY ARTERY BYPASS GRAFTING (CABG) x three , using left internal mammary artery and right leg greater saphenous vein harvested endoscopically (N/A Chest) TRANSESOPHAGEAL ECHOCARDIOGRAM (TEE) (N/A )     Patient location during evaluation: ICU Anesthesia Type: General Level of consciousness: awake and alert Pain management: pain level controlled Vital Signs Assessment: post-procedure vital signs reviewed and stable Respiratory status: spontaneous breathing, nonlabored ventilation and respiratory function stable Cardiovascular status: stable and tachycardic Postop Assessment: no apparent nausea or vomiting Anesthetic complications: no Comments: Extubated POD#0, doing well from clinical standpoint.    Last Vitals:  Vitals:   04/15/17 0900 04/15/17 1000  BP: 116/79 132/90  Pulse: (!) 106 (!) 109  Resp: (!) 25 (!) 27  Temp:    SpO2: 97% 100%    Last Pain:  Vitals:   04/15/17 0903  TempSrc:   PainSc: 2                  Beryle Lathehomas E Brock

## 2017-04-15 NOTE — Progress Notes (Signed)
Pt still refuse to stand, had to get bed weight, pt agreed to take morphine for pain, IS was offered again and pt gave no effort to exercise, explain to pt that in order to get better he has to get up and do the IS. Will relay to day shift nurse.

## 2017-04-15 NOTE — Plan of Care (Signed)
  Problem: Activity: Goal: Ability to tolerate increased activity will improve Outcome: Progressing   Problem: Activity: Goal: Risk for activity intolerance will decrease Outcome: Progressing   Problem: Cardiac: Goal: Hemodynamic stability will improve Outcome: Progressing

## 2017-04-16 ENCOUNTER — Inpatient Hospital Stay (HOSPITAL_COMMUNITY): Payer: Self-pay

## 2017-04-16 LAB — GLUCOSE, CAPILLARY
GLUCOSE-CAPILLARY: 134 mg/dL — AB (ref 65–99)
GLUCOSE-CAPILLARY: 125 mg/dL — AB (ref 65–99)
Glucose-Capillary: 106 mg/dL — ABNORMAL HIGH (ref 65–99)

## 2017-04-16 LAB — BASIC METABOLIC PANEL
ANION GAP: 8 (ref 5–15)
BUN: 8 mg/dL (ref 6–20)
CO2: 26 mmol/L (ref 22–32)
CREATININE: 1.01 mg/dL (ref 0.61–1.24)
Calcium: 8.5 mg/dL — ABNORMAL LOW (ref 8.9–10.3)
Chloride: 103 mmol/L (ref 101–111)
GFR calc non Af Amer: >60 mL/min (ref >60)
GLUCOSE: 120 mg/dL — AB (ref 65–99)
Potassium: 4 mmol/L (ref 3.5–5.1)
Sodium: 137 mmol/L (ref 135–145)

## 2017-04-16 LAB — CBC
HCT: 32.1 % — ABNORMAL LOW (ref 39.0–52.0)
HEMOGLOBIN: 10.3 g/dL — AB (ref 13.0–17.0)
MCH: 28.5 pg (ref 26.0–34.0)
MCHC: 32.1 g/dL (ref 30.0–36.0)
MCV: 88.9 fL (ref 78.0–100.0)
Platelets: 144 10*3/uL — ABNORMAL LOW (ref 150–400)
RBC: 3.61 MIL/uL — AB (ref 4.22–5.81)
RDW: 14.8 % (ref 11.5–15.5)
WBC: 19.1 10*3/uL — ABNORMAL HIGH (ref 4.0–10.5)

## 2017-04-16 MED ORDER — FUROSEMIDE 40 MG PO TABS
40.0000 mg | ORAL_TABLET | Freq: Every day | ORAL | Status: DC
  Administered 2017-04-16 – 2017-04-19 (×4): 40 mg via ORAL
  Filled 2017-04-16 (×4): qty 1

## 2017-04-16 MED ORDER — MOVING RIGHT ALONG BOOK
Freq: Once | Status: AC
  Administered 2017-04-16: 10:00:00
  Filled 2017-04-16: qty 1

## 2017-04-16 MED ORDER — CLOPIDOGREL BISULFATE 75 MG PO TABS
75.0000 mg | ORAL_TABLET | Freq: Every day | ORAL | Status: DC
  Administered 2017-04-16 – 2017-04-19 (×4): 75 mg via ORAL
  Filled 2017-04-16 (×4): qty 1

## 2017-04-16 MED ORDER — ASPIRIN 81 MG PO CHEW
81.0000 mg | CHEWABLE_TABLET | Freq: Every day | ORAL | Status: DC
  Filled 2017-04-16 (×2): qty 1

## 2017-04-16 MED ORDER — SODIUM CHLORIDE 0.9 % IV SOLN: 250.0000 mL | INTRAVENOUS | Status: DC | PRN

## 2017-04-16 MED ORDER — SODIUM CHLORIDE 0.9% FLUSH: 3.0000 mL | INTRAVENOUS | Status: DC | PRN

## 2017-04-16 MED ORDER — SODIUM CHLORIDE 0.9% FLUSH
3.0000 mL | Freq: Two times a day (BID) | INTRAVENOUS | Status: DC
  Administered 2017-04-16 – 2017-04-18 (×5): 3 mL via INTRAVENOUS

## 2017-04-16 MED ORDER — POTASSIUM CHLORIDE CRYS ER 20 MEQ PO TBCR
20.0000 meq | EXTENDED_RELEASE_TABLET | Freq: Every day | ORAL | Status: DC
  Administered 2017-04-16 – 2017-04-17 (×2): 20 meq via ORAL
  Filled 2017-04-16 (×3): qty 1

## 2017-04-16 MED ORDER — ASPIRIN EC 81 MG PO TBEC
81.0000 mg | DELAYED_RELEASE_TABLET | Freq: Every day | ORAL | Status: DC
  Administered 2017-04-16 – 2017-04-19 (×4): 81 mg via ORAL
  Filled 2017-04-16 (×4): qty 1

## 2017-04-16 MED ORDER — METOPROLOL TARTRATE 25 MG PO TABS
25.0000 mg | ORAL_TABLET | Freq: Two times a day (BID) | ORAL | Status: DC
  Administered 2017-04-16 – 2017-04-19 (×7): 25 mg via ORAL
  Filled 2017-04-16 (×7): qty 1

## 2017-04-16 NOTE — Progress Notes (Addendum)
TCTS DAILY ICU PROGRESS NOTE                   [address]            [place] [id number]          [phone]   2 Days Post-Op Procedure(s) (LRB): CORONARY ARTERY BYPASS GRAFTING (CABG) x three , using left internal mammary artery and right leg greater saphenous vein harvested endoscopically (N/A) TRANSESOPHAGEAL ECHOCARDIOGRAM (TEE) (N/A)  Total Length of Stay:  LOS: 7 days   Subjective:  No new complaints.  Continues to have pain in his back.    Objective: Vital signs in last 24 hours: Temp:  [98.1 F (36.7 C)-99.9 F (37.7 C)] 98.8 F (37.1 C) (03/29 0738) Pulse Rate:  [90-132] 104 (03/29 0700) Cardiac Rhythm: Sinus tachycardia (03/29 0500) Resp:  [15-30] 16 (03/29 0700) BP: (105-134)/(71-90) 112/81 (03/29 0700) SpO2:  [91 %-100 %] 95 % (03/29 0700) Arterial Line BP: (142-145)/(69-78) 145/78 (03/28 0900) Weight:  [178 lb 14.4 oz (81.1 kg)] 178 lb 14.4 oz (81.1 kg) (03/29 0500)  Filed Weights   04/14/17 0531 04/15/17 0500 04/16/17 0500  Weight: 174 lb 3.2 oz (79 kg) 189 lb 9.5 oz (86 kg) 178 lb 14.4 oz (81.1 kg)    Weight change: -10 lb 11.1 oz (-4.852 kg)   Hemodynamic parameters for last 24 hours: PAP: (29)/(16) 29/16  Intake/Output from previous day: 03/28 0701 - 03/29 0700 In: 1436 [P.O.:600; I.V.:536; IV Piggyback:300] Out: 2330 [Urine:2330]  Current Meds: Scheduled Meds: . acetaminophen  1,000 mg Oral Q6H   Or  . acetaminophen (TYLENOL) oral liquid 160 mg/5 mL  1,000 mg Per Tube Q6H  . aspirin EC  325 mg Oral Daily   Or  . aspirin  324 mg Per Tube Daily  . atorvastatin  80 mg Oral QHS  . bisacodyl  10 mg Oral Daily   Or  . bisacodyl  10 mg Rectal Daily  . Chlorhexidine Gluconate Cloth  6 each Topical Daily  . docusate sodium  200 mg Oral Daily  . enoxaparin (LOVENOX) injection  40 mg Subcutaneous QHS  . furosemide  40 mg Oral Daily  . metoprolol tartrate  25 mg Oral BID  . moving right along book   Does not apply Once  .  pantoprazole  40 mg Oral Daily  . potassium chloride  20 mEq Oral Daily  . sodium chloride flush  10-40 mL Intracatheter Q12H  . sodium chloride flush  3 mL Intravenous Q12H  . sodium chloride flush  3 mL Intravenous Q12H   Continuous Infusions: . sodium chloride Stopped (04/15/17 0946)  . sodium chloride    . sodium chloride 20 mL/hr (04/15/17 0620)  . sodium chloride    . dexmedetomidine (PRECEDEX) IV infusion 0.1 mcg/kg/hr (04/15/17 0039)  . lactated ringers    . lactated ringers 20 mL/hr at 04/14/17 1700  . lactated ringers 20 mL/hr at 04/15/17 2000   PRN Meds:.sodium chloride, sodium chloride, lactated ringers, metoprolol tartrate, midazolam, morphine injection, ondansetron (ZOFRAN) IV, oxyCODONE, sodium chloride flush, sodium chloride flush, sodium chloride flush, traMADol  General appearance: alert, cooperative and no distress Heart: regular rate and rhythm Lungs: clear to auscultation bilaterally Abdomen: soft, non-tender; bowel sounds normal; no masses,  no organomegaly Extremities: edema trace Wound: clean and dry  Lab Results: CBC: Recent Labs    04/15/17 1629 04/16/17 0416  WBC 18.2* 19.1*  HGB 11.0* 10.3*  HCT 33.0* 32.1*  PLT  143* 144*   BMET:  Recent Labs    04/15/17 0402  04/15/17 1539 04/16/17 0416  NA 138  --  138 137  K 4.2  --  4.0 4.0  CL 106  --  101 103  CO2 23  --   --  26  GLUCOSE 132*  --  176* 120*  BUN 10  --  9 8  CREATININE 1.01   < > 0.90 1.01  CALCIUM 8.1*  --   --  8.5*   < > = values in this interval not displayed.    CMET: Lab Results  Component Value Date   WBC 19.1 (H) 04/16/2017   HGB 10.3 (L) 04/16/2017   HCT 32.1 (L) 04/16/2017   PLT 144 (L) 04/16/2017   GLUCOSE 120 (H) 04/16/2017   CHOL 126 04/11/2017   TRIG 167 (H) 04/11/2017   HDL 31 (L) 04/11/2017   LDLDIRECT 134.0 03/15/2015   LDLCALC 62 04/11/2017   ALT 63 04/13/2017   AST 132 (H) 04/13/2017   NA 137 04/16/2017   K 4.0 04/16/2017   CL 103 04/16/2017    CREATININE 1.01 04/16/2017   BUN 8 04/16/2017   CO2 26 04/16/2017   TSH 1.90 05/31/2015   INR 1.40 04/14/2017   HGBA1C 6.1 (H) 04/11/2017      PT/INR:  Recent Labs    04/14/17 1252  LABPROT 17.1*  INR 1.40   Radiology: No results found.   Assessment/Plan: S/P Procedure(s) (LRB): CORONARY ARTERY BYPASS GRAFTING (CABG) x three , using left internal mammary artery and right leg greater saphenous vein harvested endoscopically (N/A) TRANSESOPHAGEAL ECHOCARDIOGRAM (TEE) (N/A)  1. CV- Sinus Tach, BP in the 120s- will increase Lopressor to 25 mg BID 2. Pulm- no acute issues, wean oxygen as tolerated, continue IS 3. Renal- creatinine stable at 1.01, weight is trending down, continue Lasix at 40 mg daily, supplement K 4. Expected post operative blood loss anemia, mild at 10.3 5. CBGs controlled, will stop SSIP patient not a diabetic, A1C is 6.1 will require close follow up on outpatient basis 6. Dispo- patient stable, will increase Lopressor for better HR control, start lasix, d/c central line, transfer to 4E   Lowella Dandy 04/16/2017 7:44 AM  Pain control better Ambulation around the unit this am To 4e I have seen and examined [name] [name] and agree with the above assessment  and plan. Start plavix and continue 81 mg asa    Delight Ovens MD Beeper [phone] Office [phone] 04/16/2017 9:16 AM
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
## 2017-04-17 ENCOUNTER — Inpatient Hospital Stay (HOSPITAL_COMMUNITY): Payer: Self-pay

## 2017-04-17 LAB — CBC
HCT: 30.2 % — ABNORMAL LOW (ref 39.0–52.0)
Hemoglobin: 10.3 g/dL — ABNORMAL LOW (ref 13.0–17.0)
MCH: 30.1 pg (ref 26.0–34.0)
MCHC: 34.1 g/dL (ref 30.0–36.0)
MCV: 88.3 fL (ref 78.0–100.0)
PLATELETS: 156 10*3/uL (ref 150–400)
RBC: 3.42 MIL/uL — ABNORMAL LOW (ref 4.22–5.81)
RDW: 14.6 % (ref 11.5–15.5)
WBC: 15.9 10*3/uL — AB (ref 4.0–10.5)

## 2017-04-17 LAB — BASIC METABOLIC PANEL
ANION GAP: 9 (ref 5–15)
BUN: 10 mg/dL (ref 6–20)
CO2: 26 mmol/L (ref 22–32)
Calcium: 8.5 mg/dL — ABNORMAL LOW (ref 8.9–10.3)
Chloride: 102 mmol/L (ref 101–111)
Creatinine, Ser: 0.97 mg/dL (ref 0.61–1.24)
GFR calc non Af Amer: >60 mL/min (ref >60)
Glucose, Bld: 107 mg/dL — ABNORMAL HIGH (ref 65–99)
Potassium: 3.8 mmol/L (ref 3.5–5.1)
SODIUM: 137 mmol/L (ref 135–145)

## 2017-04-17 MED ORDER — AMIODARONE HCL IN DEXTROSE 360-4.14 MG/200ML-% IV SOLN
60.0000 mg/h | INTRAVENOUS | Status: AC
  Administered 2017-04-17: 60 mg/h via INTRAVENOUS
  Filled 2017-04-17 (×2): qty 200

## 2017-04-17 MED ORDER — AMIODARONE HCL IN DEXTROSE 360-4.14 MG/200ML-% IV SOLN
30.0000 mg/h | INTRAVENOUS | Status: DC
  Administered 2017-04-17 – 2017-04-18 (×2): 30 mg/h via INTRAVENOUS
  Filled 2017-04-17: qty 200

## 2017-04-17 MED ORDER — DILTIAZEM HCL-DEXTROSE 100-5 MG/100ML-% IV SOLN (PREMIX)
5.0000 mg/h | INTRAVENOUS | Status: DC
  Administered 2017-04-17 (×2): 5 mg/h via INTRAVENOUS
  Filled 2017-04-17 (×2): qty 100

## 2017-04-17 NOTE — Progress Notes (Addendum)
3 Days Post-Op Procedure(s) (LRB): CORONARY ARTERY BYPASS GRAFTING (CABG) x three , using left internal mammary artery and right leg greater saphenous vein harvested endoscopically (N/A) TRANSESOPHAGEAL ECHOCARDIOGRAM (TEE) (N/A) Subjective: Doing well  Waiting for transfer to tele bed DC EPWs tomorrow Objective: Vital signs in last 24 hours: Temp:  [97.9 F (36.6 C)-98.7 F (37.1 C)] 97.9 F (36.6 C) (03/30 0727) Pulse Rate:  [73-109] 73 (03/30 0800) Cardiac Rhythm: Normal sinus rhythm (03/30 0600) Resp:  [14-29] 16 (03/30 0800) BP: (90-129)/(58-88) 129/88 (03/30 0800) SpO2:  [85 %-98 %] 96 % (03/30 0800) Weight:  [177 lb 11.1 oz (80.6 kg)] 177 lb 11.1 oz (80.6 kg) (03/30 0600)  Hemodynamic parameters for last 24 hours:    Intake/Output from previous day: 03/29 0701 - 03/30 0700 In: 540 [P.O.:480; I.V.:60] Out: 1330 [Urine:1330] Intake/Output this shift: No intake/output data recorded.       Exam    General- alert and comfortable    Neck- no JVD, no cervical adenopathy palpable, no carotid bruit   Lungs- clear without rales, wheezes   Cor- regular rate and rhythm, no murmur , gallop   Abdomen- soft, non-tender   Extremities - warm, non-tender, minimal edema   Neuro- oriented, appropriate, no focal weakness  Lab Results: Recent Labs    04/16/17 0416 04/17/17 0252  WBC 19.1* 15.9*  HGB 10.3* 10.3*  HCT 32.1* 30.2*  PLT 144* 156   BMET:  Recent Labs    04/16/17 0416 04/17/17 0252  NA 137 137  K 4.0 3.8  CL 103 102  CO2 26 26  GLUCOSE 120* 107*  BUN 8 10  CREATININE 1.01 0.97  CALCIUM 8.5* 8.5*    PT/INR:  Recent Labs    04/14/17 1252  LABPROT 17.1*  INR 1.40   ABG    Component Value Date/Time   PHART 7.334 (L) 04/15/2017 0028   HCO3 23.1 04/15/2017 0028   TCO2 24 04/15/2017 1539   ACIDBASEDEF 3.0 (H) 04/15/2017 0028   O2SAT 99.0 04/15/2017 0028   CBG (last 3)  Recent Labs    04/16/17 0737 04/16/17 1149 04/16/17 1647  GLUCAP 134*  125* 106*    Assessment/Plan: S/P Procedure(s) (LRB): CORONARY ARTERY BYPASS GRAFTING (CABG) x three , using left internal mammary artery and right leg greater saphenous vein harvested endoscopically (N/A) TRANSESOPHAGEAL ECHOCARDIOGRAM (TEE) (N/A) Cont current care  Home mon   LOS: 8 days    Kathlee Nationseter Van Trigt III 04/17/2017  Patient developed rapid atrial fibrillation heart rate 150-170 IV amiodarone drip started Low-dose IV Cardizem drip titrated Hold transfer to telemetry  Lovett SoxPeter Arlan Birks MD

## 2017-04-17 NOTE — Progress Notes (Signed)
Patient HR increased to 180s this AM after complaints of coughing and pain. PO Metoprolol and IV Morphine given with no changes to HR. Donata ClayVan Trigt, MD informed and Amiodarone gtt and Metoprolol IV ordered. HR consistently 140-160 after administration. Donata ClayVan trigt, MD informed again and Cardizem gtt ordered. Patient placed back in bed. After roughly 1 hour HR converted to NSR with a rate of 80-90.

## 2017-04-18 LAB — CBC
HCT: 29.9 % — ABNORMAL LOW (ref 39.0–52.0)
Hemoglobin: 9.7 g/dL — ABNORMAL LOW (ref 13.0–17.0)
MCH: 28.7 pg (ref 26.0–34.0)
MCHC: 32.4 g/dL (ref 30.0–36.0)
MCV: 88.5 fL (ref 78.0–100.0)
Platelets: 183 10*3/uL (ref 150–400)
RBC: 3.38 MIL/uL — ABNORMAL LOW (ref 4.22–5.81)
RDW: 14.7 % (ref 11.5–15.5)
WBC: 13.5 10*3/uL — ABNORMAL HIGH (ref 4.0–10.5)

## 2017-04-18 LAB — BASIC METABOLIC PANEL
Anion gap: 12 (ref 5–15)
BUN: 12 mg/dL (ref 6–20)
CO2: 26 mmol/L (ref 22–32)
Calcium: 8.2 mg/dL — ABNORMAL LOW (ref 8.9–10.3)
Chloride: 99 mmol/L — ABNORMAL LOW (ref 101–111)
Creatinine, Ser: 1.04 mg/dL (ref 0.61–1.24)
GFR calc Af Amer: >60 mL/min (ref >60)
GFR calc non Af Amer: >60 mL/min (ref >60)
Glucose, Bld: 113 mg/dL — ABNORMAL HIGH (ref 65–99)
Potassium: 3.5 mmol/L (ref 3.5–5.1)
Sodium: 137 mmol/L (ref 135–145)

## 2017-04-18 MED ORDER — AMIODARONE HCL 200 MG PO TABS
400.0000 mg | ORAL_TABLET | Freq: Two times a day (BID) | ORAL | Status: DC
  Administered 2017-04-18 – 2017-04-19 (×3): 400 mg via ORAL
  Filled 2017-04-18 (×3): qty 2

## 2017-04-18 MED ORDER — POTASSIUM CHLORIDE CRYS ER 20 MEQ PO TBCR
40.0000 meq | EXTENDED_RELEASE_TABLET | Freq: Two times a day (BID) | ORAL | Status: DC
  Administered 2017-04-18 – 2017-04-19 (×2): 40 meq via ORAL
  Filled 2017-04-18 (×2): qty 2

## 2017-04-18 MED ORDER — POTASSIUM CHLORIDE CRYS ER 20 MEQ PO TBCR
40.0000 meq | EXTENDED_RELEASE_TABLET | Freq: Once | ORAL | Status: AC
  Administered 2017-04-18: 40 meq via ORAL
  Filled 2017-04-18: qty 2

## 2017-04-18 NOTE — Progress Notes (Signed)
4 Days Post-Op Procedure(s) (LRB): CORONARY ARTERY BYPASS GRAFTING (CABG) x three , using left internal mammary artery and right leg greater saphenous vein harvested endoscopically (N/A) TRANSESOPHAGEAL ECHOCARDIOGRAM (TEE) (N/A) Subjective: Feels well- back in nsr Start po amio tx to 4 [place]  Objective: Vital signs in last 24 hours: Temp:  [98.1 F (36.7 C)-98.6 F (37 C)] 98.4 F (36.9 C) (03/31 0731) Pulse Rate:  [61-143] 76 (03/31 0600) Cardiac Rhythm: Normal sinus rhythm (03/31 0600) Resp:  [12-28] 22 (03/31 0600) BP: (95-121)/(65-96) 101/67 (03/31 0600) SpO2:  [89 %-100 %] 92 % (03/31 0600) Weight:  [176 lb 2.4 oz (79.9 kg)] 176 lb 2.4 oz (79.9 kg) (03/31 0600)  Hemodynamic parameters for last 24 hours:    Intake/Output from previous day: 03/30 0701 - 03/31 0700 In: 429.6 [I.V.:429.6] Out: 1600 [Urine:1600] Intake/Output this shift: No intake/output data recorded.       Exam    General- alert and comfortable    Neck- no JVD, no cervical adenopathy palpable, no carotid bruit   Lungs- clear without rales, wheezes   Cor- regular rate and rhythm, no murmur , gallop   Abdomen- soft, non-tender   Extremities - warm, non-tender, minimal edema   Neuro- oriented, appropriate, no focal weakness   Lab Results: Recent Labs    04/17/17 0252 04/18/17 0210  WBC 15.9* 13.5*  HGB 10.3* 9.7*  HCT 30.2* 29.9*  PLT 156 183   BMET:  Recent Labs    04/17/17 0252 04/18/17 0210  NA 137 137  K 3.8 3.5  CL 102 99*  CO2 26 26  GLUCOSE 107* 113*  BUN 10 12  CREATININE 0.97 1.04  CALCIUM 8.5* 8.2*    PT/INR: No results for input(s): LABPROT, INR in the last 72 hours. ABG    Component Value Date/Time   PHART 7.334 (L) 04/15/2017 0028   HCO3 23.1 04/15/2017 0028   TCO2 24 04/15/2017 1539   ACIDBASEDEF 3.0 (H) 04/15/2017 0028   O2SAT 99.0 04/15/2017 0028   CBG (last 3)  Recent Labs    04/16/17 0737 04/16/17 1149 04/16/17 1647  GLUCAP 134* 125* 106*     Assessment/Plan: S/P Procedure(s) (LRB): CORONARY ARTERY BYPASS GRAFTING (CABG) x three , using left internal mammary artery and right leg greater saphenous vein harvested endoscopically (N/A) TRANSESOPHAGEAL ECHOCARDIOGRAM (TEE) (N/A) Mobilize Diuresis tx to tele bed   LOS: 9 days    Kathlee Nationseter Van Trigt III 04/18/2017

## 2017-04-18 NOTE — Progress Notes (Signed)
Pt arrived to 4e from 2h. Vitals obtained. CCMD notified x2. Pt oriented to room and staff. Incisions clean and dry. Pacing wires rolled and taped. Pt denies needs at this time. Will continue current plan of care.   Ardeen JourdainLauren Abanoub Hanken BSN, RN

## 2017-04-19 DIAGNOSIS — Z951 Presence of aortocoronary bypass graft: Secondary | ICD-10-CM

## 2017-04-19 DIAGNOSIS — I2510 Atherosclerotic heart disease of native coronary artery without angina pectoris: Secondary | ICD-10-CM

## 2017-04-19 LAB — BASIC METABOLIC PANEL
Anion gap: 12 (ref 5–15)
BUN: 15 mg/dL (ref 6–20)
CO2: 25 mmol/L (ref 22–32)
Calcium: 8.5 mg/dL — ABNORMAL LOW (ref 8.9–10.3)
Chloride: 99 mmol/L — ABNORMAL LOW (ref 101–111)
Creatinine, Ser: 1.14 mg/dL (ref 0.61–1.24)
GFR calc Af Amer: >60 mL/min (ref >60)
GFR calc non Af Amer: >60 mL/min (ref >60)
Glucose, Bld: 125 mg/dL — ABNORMAL HIGH (ref 65–99)
Potassium: 3.7 mmol/L (ref 3.5–5.1)
Sodium: 136 mmol/L (ref 135–145)

## 2017-04-19 LAB — CBC
HCT: 31.2 % — ABNORMAL LOW (ref 39.0–52.0)
Hemoglobin: 10.4 g/dL — ABNORMAL LOW (ref 13.0–17.0)
MCH: 29.5 pg (ref 26.0–34.0)
MCHC: 33.3 g/dL (ref 30.0–36.0)
MCV: 88.4 fL (ref 78.0–100.0)
Platelets: 254 10*3/uL (ref 150–400)
RBC: 3.53 MIL/uL — ABNORMAL LOW (ref 4.22–5.81)
RDW: 15.1 % (ref 11.5–15.5)
WBC: 11 10*3/uL — ABNORMAL HIGH (ref 4.0–10.5)

## 2017-04-19 MED ORDER — ACETAMINOPHEN 500 MG PO TABS: 1000.0000 mg | ORAL_TABLET | Freq: Four times a day (QID) | ORAL | 0 refills | Status: DC | PRN

## 2017-04-19 MED ORDER — CLOPIDOGREL BISULFATE 75 MG PO TABS: 75.0000 mg | ORAL_TABLET | Freq: Every day | ORAL | 3 refills | Status: DC

## 2017-04-19 MED ORDER — AMIODARONE HCL 400 MG PO TABS: 400.0000 mg | ORAL_TABLET | Freq: Two times a day (BID) | ORAL | 1 refills | Status: DC

## 2017-04-19 MED ORDER — METOPROLOL TARTRATE 25 MG PO TABS: 25.0000 mg | ORAL_TABLET | Freq: Two times a day (BID) | ORAL | 3 refills | Status: DC

## 2017-04-19 MED ORDER — ATORVASTATIN CALCIUM 80 MG PO TABS: 80.0000 mg | ORAL_TABLET | Freq: Every day | ORAL | 3 refills | Status: DC

## 2017-04-19 MED ORDER — OXYCODONE HCL 5 MG PO TABS: 5.0000 mg | ORAL_TABLET | ORAL | 0 refills | Status: DC | PRN

## 2017-04-19 NOTE — Progress Notes (Signed)
CARDIAC REHAB PHASE I   Pt ambulating in hall upon arrival to department.  Pt stated that he has been walking with no difficulty.  Education completed with patient and significant other including IS, sternal precautions, RF modification, smoking cessation, and exercise guidelines.  Verbalized understanding.  CRP 2 Ref[facility].   Nikki Domverett, Leauna Sharber G, RN 04/19/2017 9:49 AM

## 2017-04-19 NOTE — Progress Notes (Addendum)
      [address]       [place] [id number]             [phone]      5 Days Post-Op Procedure(s) (LRB): CORONARY ARTERY BYPASS GRAFTING (CABG) x three , using left internal mammary artery and right leg greater saphenous vein harvested endoscopically (N/A) TRANSESOPHAGEAL ECHOCARDIOGRAM (TEE) (N/A)   Subjective:  No new complaints.  Feels pretty good and wants to go home.  + ambulation  + BM  Objective: Vital signs in last 24 hours: Temp:  [98.2 F (36.8 C)-98.8 F (37.1 C)] 98.2 F (36.8 C) (04/01 0520) Pulse Rate:  [79-97] 79 (04/01 0520) Cardiac Rhythm: Normal sinus rhythm (04/01 0700) Resp:  [18-22] 22 (04/01 0520) BP: (104-125)/(66-85) 115/73 (04/01 0520) SpO2:  [90 %-98 %] 90 % (04/01 0520) Weight:  [174 lb 9.6 oz (79.2 kg)] 174 lb 9.6 oz (79.2 kg) (04/01 0520)  Intake/Output from previous day: 03/31 0701 - 04/01 0700 In: 1266.8 [P.O.:1200; I.V.:66.8] Out: 2150 [Urine:2150]  General appearance: alert, cooperative and no distress Heart: regular rate and rhythm Lungs: clear to auscultation bilaterally Abdomen: soft, non-tender; bowel sounds normal; no masses,  no organomegaly Extremities: extremities normal, atraumatic, no cyanosis or edema Wound: clean and dry  Lab Results: Recent Labs    04/18/17 0210 04/19/17 0619  WBC 13.5* 11.0*  HGB 9.7* 10.4*  HCT 29.9* 31.2*  PLT 183 254   BMET:  Recent Labs    04/18/17 0210 04/19/17 0619  NA 137 136  K 3.5 3.7  CL 99* 99*  CO2 26 25  GLUCOSE 113* 125*  BUN 12 15  CREATININE 1.04 1.14  CALCIUM 8.2* 8.5*    PT/INR: No results for input(s): LABPROT, INR in the last 72 hours. ABG    Component Value Date/Time   PHART 7.334 (L) 04/15/2017 0028   HCO3 23.1 04/15/2017 0028   TCO2 24 04/15/2017 1539   ACIDBASEDEF 3.0 (H) 04/15/2017 0028   O2SAT 99.0 04/15/2017 0028   CBG (last 3)  Recent Labs    04/16/17 1149 04/16/17 1647  GLUCAP 125* 106*    Assessment/Plan: S/P Procedure(s)  (LRB): CORONARY ARTERY BYPASS GRAFTING (CABG) x three , using left internal mammary artery and right leg greater saphenous vein harvested endoscopically (N/A) TRANSESOPHAGEAL ECHOCARDIOGRAM (TEE) (N/A)  1. CV- PAF, currently NSR- continue Lopressor, Amiodarone, Plavix... Will d/c EPW today 2. Pulm- no acute issues, continue IS  3. Renal- creatinine at 1.14, no edema on exam, will stop Lasix 4. Expected post operative blood loss anemia, mild 5. dispo- patient stable, d/c EPW today, if no arrhythmia occurs will d/c home this afternoon   LOS: 10 days    [name] [name] 04/19/2017  plan d/c today I have seen and examined [name] [name] and agree with the above assessment  and plan.  Delight OvensEdward B Jolie Strohecker MD [facility] [phone] Office [phone] 04/19/2017 8:22 AM

## 2017-04-19 NOTE — Care Management Note (Signed)
Case Management Note [name] [name], [name] [name]-- 2H coverage [phone]  Patient Details  Name: [name] [name] MRN: [id number] Date of Birth: 10/04/1970  Subjective/Objective: Pt admitted with NSTEMI, MVD- s/p CABGx3 on 04/14/17                   Action/Plan: PTA pt lived at home,  PCP-Regina Silver [place],  CM to follow for transition of care needs  Expected Discharge Date:  04/19/17               Expected Discharge Plan:  Home/Self Care  In-House Referral:     Discharge planning Services  CM Consult  Post Acute Care Choice:  NA Choice offered to:  NA  DME Arranged:    DME Agency:     HH Arranged:    HH Agency:     Status of Service:  Completed, signed off  If discussed at Long Length of Stay Meetings, dates discussed:    Discharge Disposition: home/self care   Additional Comments:  04/19/17- 1145- [name] Michna [name] CM- pt for discharge home today- no CM needs noted for transition home.   [name] [name], [name] Justice Hall, [name] 04/19/2017, 11:48 AM

## 2017-04-20 ENCOUNTER — Telehealth: Payer: Self-pay | Admitting: Cardiovascular Disease

## 2017-04-20 ENCOUNTER — Telehealth: Payer: Self-pay

## 2017-04-20 ENCOUNTER — Encounter: Payer: Self-pay | Admitting: Physician Assistant

## 2017-04-20 NOTE — Telephone Encounter (Signed)
[name] called and stated that he has some "kool-aid" red drainage coming from his incision site.  He is s/p CABG x3 04/14/2017.  He stated that this happened after he coughed 2 times and was unable to get to his pillow to splint.  He stated the the site was some red, but not really red.  He said he did not feel as if he had a fever, however he did not check his temperature.  I advised him to send a picture of the incision through MyChart although it does not sound as if it is infected.

## 2017-04-20 NOTE — Telephone Encounter (Signed)
I haven't seen him in 2 years. No need to set him up with me.

## 2017-04-20 NOTE — Telephone Encounter (Signed)
Patient discharged yesterday after undergoing urgent by-pass surgery following NSTEMI.  Hospital set up follow up appts for patient with cardiology and cardiothoracic surgery but did not make plans for PCP follow up.  Would you like for me to get patient in with you in the near future?    Thanks.

## 2017-04-20 NOTE — Telephone Encounter (Signed)
Noted  

## 2017-04-20 NOTE — Telephone Encounter (Signed)
New Message    Patient states he is returning [place]  call

## 2017-04-20 NOTE — Telephone Encounter (Signed)
Spoke with pt, he has talked with the surgeon's office and they have instructed him on what to do.

## 2017-05-04 ENCOUNTER — Ambulatory Visit (INDEPENDENT_AMBULATORY_CARE_PROVIDER_SITE_OTHER): Payer: Self-pay | Admitting: Physician Assistant

## 2017-05-04 ENCOUNTER — Encounter: Payer: Self-pay | Admitting: Physician Assistant

## 2017-05-04 VITALS — BP 134/70 | HR 58 | Ht 69.0 in | Wt 175.0 lb

## 2017-05-04 DIAGNOSIS — E785 Hyperlipidemia, unspecified: Secondary | ICD-10-CM

## 2017-05-04 DIAGNOSIS — Z951 Presence of aortocoronary bypass graft: Secondary | ICD-10-CM

## 2017-05-04 DIAGNOSIS — Z79899 Other long term (current) drug therapy: Secondary | ICD-10-CM

## 2017-05-04 DIAGNOSIS — I480 Paroxysmal atrial fibrillation: Secondary | ICD-10-CM

## 2017-05-04 NOTE — Patient Instructions (Addendum)
Medication Instructions:  Continue current medications  If you need a refill on your cardiac medications before your next appointment, please call your pharmacy.  Labwork: Fasting Lipids and CMP    Testing/Procedures: Your physician has recommended that you wear an event monitor for 1 Month. Event monitors are medical devices that record the heart's electrical activity. Doctors most often us these monitors to diagnose arrhythmias. Arrhythmias are problems with the speed or rhythm of the heartbeat. The monitor is a small, portable device. You can wear one while you do your normal daily activities. This is usually used to diagnose what is causing palpitations/syncope (passing out).   Follow-Up: Your physician wants you to follow-up in: 3 Months with Dr Allyson SabalBerry. You should receive a reminder letter in the mail two months in advance. If you do not receive a letter, please call our office [phone].     Thank you for choosing [facility]!!
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
## 2017-05-10 ENCOUNTER — Encounter: Payer: Self-pay | Admitting: Physician Assistant

## 2017-05-17 ENCOUNTER — Other Ambulatory Visit: Payer: Self-pay | Admitting: Physician Assistant

## 2017-05-18 ENCOUNTER — Encounter: Payer: Self-pay | Admitting: Physician Assistant

## 2017-05-19 MED ORDER — AMIODARONE HCL 200 MG PO TABS: 200.0000 mg | ORAL_TABLET | Freq: Every day | ORAL | 6 refills | Status: DC

## 2017-05-20 ENCOUNTER — Ambulatory Visit: Payer: Self-pay

## 2017-05-20 ENCOUNTER — Telehealth: Payer: Self-pay | Admitting: Physician Assistant

## 2017-05-20 NOTE — Telephone Encounter (Signed)
[name] [name] was giving the application for [employer] for event monitor.   Once patient has filled out the application it will be enrolled into the [employer] and the application faxed to the company.   [employer] will contact the patient to let him know if he is or is not approve for the monitor/saf

## 2017-05-24 ENCOUNTER — Other Ambulatory Visit: Payer: Self-pay | Admitting: Cardiothoracic Surgery

## 2017-05-24 ENCOUNTER — Ambulatory Visit: Payer: Self-pay

## 2017-05-24 DIAGNOSIS — Z951 Presence of aortocoronary bypass graft: Secondary | ICD-10-CM

## 2017-05-28 ENCOUNTER — Encounter: Payer: Self-pay | Admitting: Physician Assistant

## 2017-06-01 ENCOUNTER — Encounter: Payer: Self-pay | Admitting: Physician Assistant

## 2017-06-03 ENCOUNTER — Emergency Department (HOSPITAL_COMMUNITY)
Admission: EM | Admit: 2017-06-03 | Discharge: 2017-06-03 | Disposition: A | Discharge status: Home / Self Care | Payer: Self-pay | Attending: Emergency Medicine | Admitting: Emergency Medicine

## 2017-06-03 ENCOUNTER — Encounter (HOSPITAL_COMMUNITY): Payer: Self-pay | Admitting: Emergency Medicine

## 2017-06-03 ENCOUNTER — Other Ambulatory Visit: Payer: Self-pay

## 2017-06-03 ENCOUNTER — Emergency Department (HOSPITAL_COMMUNITY): Payer: Self-pay

## 2017-06-03 DIAGNOSIS — I2510 Atherosclerotic heart disease of native coronary artery without angina pectoris: Secondary | ICD-10-CM | POA: Insufficient documentation

## 2017-06-03 DIAGNOSIS — Z87891 Personal history of nicotine dependence: Secondary | ICD-10-CM | POA: Insufficient documentation

## 2017-06-03 DIAGNOSIS — Z79899 Other long term (current) drug therapy: Secondary | ICD-10-CM | POA: Insufficient documentation

## 2017-06-03 DIAGNOSIS — R079 Chest pain, unspecified: Secondary | ICD-10-CM | POA: Insufficient documentation

## 2017-06-03 DIAGNOSIS — I119 Hypertensive heart disease without heart failure: Secondary | ICD-10-CM | POA: Insufficient documentation

## 2017-06-03 LAB — CBC
HCT: 43.3 % (ref 39.0–52.0)
HEMOGLOBIN: 14 g/dL (ref 13.0–17.0)
MCH: 27.7 pg (ref 26.0–34.0)
MCHC: 32.3 g/dL (ref 30.0–36.0)
MCV: 85.7 fL (ref 78.0–100.0)
Platelets: 316 10*3/uL (ref 150–400)
RBC: 5.05 MIL/uL (ref 4.22–5.81)
RDW: 15.1 % (ref 11.5–15.5)
WBC: 10.6 10*3/uL — ABNORMAL HIGH (ref 4.0–10.5)

## 2017-06-03 LAB — BASIC METABOLIC PANEL
ANION GAP: 7 (ref 5–15)
BUN: 11 mg/dL (ref 6–20)
CALCIUM: 9.1 mg/dL (ref 8.9–10.3)
CHLORIDE: 110 mmol/L (ref 101–111)
CO2: 24 mmol/L (ref 22–32)
CREATININE: 1.45 mg/dL — AB (ref 0.61–1.24)
GFR calc non Af Amer: 56 mL/min — ABNORMAL LOW (ref >60)
GLUCOSE: 118 mg/dL — AB (ref 65–99)
Potassium: 4.2 mmol/L (ref 3.5–5.1)
Sodium: 141 mmol/L (ref 135–145)

## 2017-06-03 LAB — I-STAT TROPONIN, ED
TROPONIN I, POC: 0 ng/mL (ref 0.00–0.08)
TROPONIN I, POC: 0.01 ng/mL (ref 0.00–0.08)

## 2017-06-03 NOTE — ED Triage Notes (Signed)
Chest pain started 30 mins PTA. Pt had triple bypass in April. Pt states was arguing with mother and chest pain started.pt states constant pain with slight sob.
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
## 2017-06-03 NOTE — ED Notes (Signed)
EDP at bedside  
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
## 2017-06-10 ENCOUNTER — Ambulatory Visit
Admission: RE | Admit: 2017-06-10 | Discharge: 2017-06-10 | Disposition: A | Discharge status: Home / Self Care | Payer: Medicaid Other | Source: Ambulatory Visit | Attending: Cardiothoracic Surgery | Admitting: Cardiothoracic Surgery

## 2017-06-10 ENCOUNTER — Ambulatory Visit (INDEPENDENT_AMBULATORY_CARE_PROVIDER_SITE_OTHER): Payer: Self-pay | Admitting: Cardiothoracic Surgery

## 2017-06-10 VITALS — BP 104/72 | HR 90 | Resp 20 | Ht 69.0 in | Wt 185.0 lb

## 2017-06-10 DIAGNOSIS — Z951 Presence of aortocoronary bypass graft: Secondary | ICD-10-CM

## 2017-06-10 DIAGNOSIS — I2510 Atherosclerotic heart disease of native coronary artery without angina pectoris: Secondary | ICD-10-CM
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
## 2017-06-10 NOTE — Patient Instructions (Signed)
    [address]       [place] [address]             [phone]       Coronary Artery Bypass Grafting  Care After  Refer to this sheet in the next few weeks. These instructions provide you with information on caring for yourself after your procedure. Your caregiver may also give you more specific instructions. Your treatment has been planned according to current medical practices, but problems sometimes occur. Call your caregiver if you have any problems or questions after your procedure.  Recovery from open heart surgery will be different for everyone. Some people feel well after 3 or 4 weeks, while for others it takes longer. After heart surgery, it may be normal to:  Not have an appetite, feel nauseated by the smell of food, or only want to eat a small amount.   Be constipated because of changes in your diet, activity, and medicines. Eat foods high in fiber. Add fresh fruits and vegetables to your diet. Stool softeners may be helpful.   Feel sad or unhappy. You may be frustrated or cranky. You may have good days and bad days. Do not give up. Talk to your caregiver if you do not feel better.   Feel weakness and fatigue. You many need physical therapy or cardiac rehabilitation to get your strength back.   Develop an irregular heartbeat called atrial fibrillation. Symptoms of atrial fibrillation are a fast, irregular heartbeat or feelings of fluttery heartbeats, shortness of breath, low blood pressure, and dizziness. If these symptoms develop, see your caregiver right away.  MEDICATION  Have a list of all the medicines you will be taking when you leave the hospital. For every medicine, know the following:   Name.   Exact dose.   Time of day to be taken.   How often it should be taken.   Why you are taking it.   Ask which medicines should or should not be taken together. If you take more than one heart medicine, ask if it is okay to take them together. Some  heart medicines should not be taken at the same time because they may lower your blood pressure too much.   Narcotic pain medicine can cause constipation. Eat fresh fruits and vegetables. Add fiber to your diet. Stool softener medicine may help relieve constipation.   Keep a copy of your medicines with you at all times.   Do not add or stop taking any medicine until you check with your caregiver.   Medicines can have side effects. Call your caregiver who prescribed the medicine if you:   Start throwing up, have diarrhea, or have stomach pain.   Feel dizzy or lightheaded when you stand up.   Feel your heart is skipping beats or is beating too fast or too slow.   Develop a rash.   Notice unusual bruising or bleeding.  HOME CARE INSTRUCTIONS  After heart surgery, it is important to learn how to take your pulse. Have your caregiver show you how to take your pulse.   Use your incentive spirometer. Ask your caregiver how long after surgery you need to use it.  Care of your chest incision  Tell your caregiver right away if you notice clicking in your chest (sternum).   Support your chest with a pillow or your arms when you take deep breaths and cough.   Follow your caregiver's instructions about when you can bathe or   swim.   Protect your incision from sunlight during the first year to keep the scar from getting dark.   Tell your caregiver if you notice:   Increased tenderness of your incision.   Increased redness or swelling around your incision.   Drainage or pus from your incision.  Care of your leg incision(s)  Avoid crossing your legs.   Avoid sitting for long periods of time. Change positions every half hour.   Elevate your leg(s) when you are sitting.   Check your leg(s) daily for swelling. Check the incisions for redness or drainage.   Diet is very important to heart health.   Eat plenty of fresh fruits and vegetables. Meats should be lean cut. Avoid canned,  processed, and fried foods.   Talk to a dietician. They can teach you how to make healthy food and drink choices.  Weight  Weigh yourself every day. This is important because it helps to know if you are retaining fluid that may make your heart and lungs work harder.   Use the same scale each time.   Weigh yourself every morning at the same time. You should do this after you go to the bathroom, but before you eat breakfast.   Your weight will be more accurate if you do not wear any clothes.   Record your weight.   Tell your caregiver if you have gained 2 pounds or more overnight.  Activity Stop any activity at once if you have chest pain, shortness of breath, irregular heartbeats, or dizziness. Get help right away if you have any of these symptoms.  Bathing.  Avoid soaking in a bath or hot tub until your incisions are healed.   Rest. You need a balance of rest and activity.   Exercise. Exercise per your caregiver's advice. You may need physical therapy or cardiac rehabilitation to help strengthen your muscles and build your endurance.   Climbing stairs. Unless your caregiver tells you not to climb stairs, go up stairs slowly and rest if you tire. Do not pull yourself up by the handrail.   Driving a car. Follow your caregiver's advice on when you may drive. You may ride as a passenger at any time. When traveling for long periods of time in a car, get out of the car and walk around for a few minutes every 2 hours.   Lifting. Avoid lifting, pushing, or pulling anything heavier than 10 pounds for 6 weeks after surgery or as told by your caregiver.   Returning to work. Check with your caregiver. People heal at different rates. Most people will be able to go back to work 6 to 12 weeks after surgery.   Sexual activity. You may resume sexual relations as told by your caregiver.  SEEK MEDICAL CARE IF:  Any of your incisions are red, painful, or have any type of drainage coming from them.     You have an oral temperature above 101.5 F .   You have ankle or leg swelling.   You have pain in your legs.   You have weight gain of 2 or more pounds a day.   You feel dizzy or lightheaded when you stand up.  SEEK IMMEDIATE MEDICAL CARE IF:  You have angina or chest pain that goes to your jaw or arms. Call your local emergency services right away.   You have shortness of breath at rest or with activity.   You have a fast or irregular heartbeat (arrhythmia).   There is   a "clicking" in your sternum when you move.   You have numbness or weakness in your arms or legs.  MAKE SURE YOU:  Understand these instructions.   Will watch your condition.   Will get help right away if you are not doing well or get worse.    No lifting over 25 lbs for 3 months 

## 2017-08-03 ENCOUNTER — Encounter: Payer: Self-pay | Admitting: Cardiovascular Disease

## 2017-08-03 ENCOUNTER — Ambulatory Visit (INDEPENDENT_AMBULATORY_CARE_PROVIDER_SITE_OTHER): Payer: Self-pay | Admitting: Cardiovascular Disease

## 2017-08-03 VITALS — BP 106/62 | HR 58 | Ht 69.0 in | Wt 183.0 lb

## 2017-08-03 DIAGNOSIS — Z951 Presence of aortocoronary bypass graft: Secondary | ICD-10-CM

## 2017-08-03 DIAGNOSIS — I10 Essential (primary) hypertension: Secondary | ICD-10-CM

## 2017-08-03 DIAGNOSIS — F17200 Nicotine dependence, unspecified, uncomplicated: Secondary | ICD-10-CM

## 2017-08-03 DIAGNOSIS — I480 Paroxysmal atrial fibrillation: Secondary | ICD-10-CM

## 2017-08-03 DIAGNOSIS — I739 Peripheral vascular disease, unspecified: Secondary | ICD-10-CM

## 2017-08-03 NOTE — Assessment & Plan Note (Signed)
Perioperative A. fib on amiodarone without recurrence.  We will stop his amiodarone.

## 2017-08-03 NOTE — Assessment & Plan Note (Signed)
History of CAD status [place] [facility] [facility] [facility] [place] with cardiac catheterization performed by Dr. Excell Seltzerooper 04/09/2017 revealed 80% "in-stent restenosis.  He underwent coronary artery bypass grafting by Dr. Tyrone SageGerhardt 04/07/2017 with a [name] to his LAD, vein to the obtuse marginal branch and second obtuse marginal branch.  He is done well since.

## 2017-08-03 NOTE — Assessment & Plan Note (Signed)
History of peripheral arterial disease status post bilateral iliac stenting 05/16/2013 repeat left iliac intervention by myself 06/06/2015 distal edge proximal left iliac stent with a 9 mm x 3 cm nitinol self-expanding stent.  He no longer has claudication.  We will recheck lower extremity arterial Doppler studies.

## 2017-08-03 NOTE — Assessment & Plan Note (Signed)
History of hyperlipidemia on statin therapy.  Lipid profile performed 04/09/2017 revealed total cholesterol 118, LDL 62 and HDL 26.

## 2017-08-03 NOTE — Progress Notes (Signed)
08/03/2017 [name] [name]   Nov 03, 1970  [id number]  Primary Physician Lorre Munroe, NP Primary Cardiologist: [name] Roseanne [name]  HPI:  [name] [name] is a 47 y.o.  single Caucasian male father of one 12 year old son who is referred by [name] registered ,nurse practitioner, for peripheral vascular evaluation. I just saw him in the office 06/28/2015. He has a history of acute tobacco abuse having smoked 40-60 pack years, treated hypertension and hyperlipidemia. She has never had a heart attack or stroke and denies chest pain or shortness of breath. He saw [name] years ago and ultimately underwent angiography/ 05/16/13 revealing bilateral iliac disease both of which were stented with balloon expandable and self expanding stents. He enjoyed symptomatic relief for approximately one year and then developed recurrent left calf claudication. Recent Dopplers performed 05/02/15 revealed a left ABI 0.78 with a high-frequency signal in his left external iliac artery. I performed lower extremity angiography on him 06/06/15 revealing a patent left common and external iliac artery stent with a 95% stenosis at the distal edge of the proximal stent which I restented with a 9 mm x 3 cm nitinol self expanding stent. His symptoms of claudication have resolved and his Dopplers have normalized. He has in addition stop smoking by his account. He had a left main stent placed in [facility] August of last year and underwent repeat cardiac catheterization by Dr. 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  He underwent coronary artery bypass grafting x3 by Dr. [name] 04/07/2017 with a [name] to the LAD and vein to [name] and 2.  He is done well since.  He has stopped smoking.  He denies chest pain, shortness of breath or claudication.  Current Meds  Medication Sig  . acetaminophen (TYLENOL) 500 MG tablet Take 2 tablets (1,000 mg total) by mouth every  6 (six) hours as needed.  [name] aspirin EC 81 MG tablet Take 1 tablet (81 mg total) by mouth daily.  [name] atorvastatin (LIPITOR) 80 MG tablet Take 1 tablet (80 mg total) by mouth at bedtime.  . clopidogrel (PLAVIX) 75 MG tablet Take 1 tablet (75 mg total) by mouth daily.  . famotidine (PEPCID) 20 MG tablet Take 20 mg by mouth daily as needed for heartburn or indigestion.  . metoprolol tartrate (LOPRESSOR) 25 MG tablet Take 1 tablet (25 mg total) by mouth 2 (two) times daily.  . [DISCONTINUED] amiodarone (PACERONE) 200 MG tablet Take 1 tablet (200 mg total) by mouth daily.     Allergies  Allergen Reactions  . Codeine Other (See Comments)    Migraine     Social History   Socioeconomic History  . Marital status: Divorced    Spouse name: Not on file  . Number of children: Not on file  . Years of education: Not on file  . Highest education level: Not on file  Occupational History  . Occupation: Truck Runner, broadcasting/film/video  . Financial resource strain: Not on file  . Food insecurity:    Worry: Not on file    Inability: Not on file  . Transportation needs:    Medical: Not on file    Non-medical: Not on file  Tobacco Use  . Smoking status: Former Smoker    Packs/day: 1.00    Years: 30.00    Pack years: 30.00    Types: Cigarettes    Last attempt to quit: 06/07/2015  Years since quitting: 2.1  . Smokeless tobacco: Former Neurosurgeon    Types: Snuff  . Tobacco comment: "used snuff in my teens"  Substance and Sexual Activity  . Alcohol use: Yes    Alcohol/week: 0.0 oz    Comment: 06/06/2015 "might have 1 beer/month"  . Drug use: No  . Sexual activity: Not Currently  Lifestyle  . Physical activity:    Days per week: Not on file    Minutes per session: Not on file  . Stress: Not on file  Relationships  . Social connections:    Talks on phone: Not on file    Gets together: Not on file    Attends religious service: Not on file    Active member of club or organization: Not on file     Attends meetings of clubs or organizations: Not on file    Relationship status: Not on file  . Intimate partner violence:    Fear of current or ex partner: Not on file    Emotionally abused: Not on file    Physically abused: Not on file    Forced sexual activity: Not on file  Other Topics Concern  . Not on file  Social History Narrative  . Not on file     Review of Systems: General: negative for chills, fever, night sweats or weight changes.  Cardiovascular: negative for chest pain, dyspnea on exertion, edema, orthopnea, palpitations, paroxysmal nocturnal dyspnea or shortness of breath Dermatological: negative for rash Respiratory: negative for cough or wheezing Urologic: negative for hematuria Abdominal: negative for nausea, vomiting, diarrhea, bright red blood per rectum, melena, or hematemesis Neurologic: negative for visual changes, syncope, or dizziness All other systems reviewed and are otherwise negative except as noted above.    Blood pressure 106/62, pulse (!) 58, height 5\' 9"  (1.753 m), weight 183 lb (83 kg).  General appearance: alert and no distress Neck: no adenopathy, no carotid bruit, no JVD, supple, symmetrical, trachea midline and thyroid not enlarged, symmetric, no tenderness/mass/nodules Lungs: clear to auscultation bilaterally Heart: regular rate and rhythm, S1, S2 normal, no murmur, click, rub or gallop Extremities: extremities normal, atraumatic, no cyanosis or edema Pulses: 2+ and symmetric Skin: Skin color, texture, turgor normal. No rashes or lesions Neurologic: Alert and oriented X 3, normal strength and tone. Normal symmetric reflexes. Normal coordination and gait  EKG not performed today  ASSESSMENT AND PLAN:   Claudication in peripheral vascular disease (HCC) History of peripheral arterial disease status post bilateral iliac stenting 05/16/2013 repeat left iliac intervention by myself 06/06/2015 distal edge proximal left iliac stent with a 9 mm x 3  cm nitinol self-expanding stent.  He no longer has claudication.  We will recheck lower extremity arterial Doppler studies.  HLD (hyperlipidemia) History of hyperlipidemia on statin therapy.  Lipid profile performed 04/09/2017 revealed total cholesterol 118, LDL 62 and HDL 26.  Smoking Discontinued 3/19  Essential hypertension History of essential hypertension her blood pressure measured at 106/62.  He is on metoprolol.  Continue current meds at current dosing.  S/P CABG x 3 History of CAD status post left main stenting in [facility] August of last year with cardiac catheterization performed by Dr. [name] 04/09/2017 revealed 80% "in-stent restenosis.  He underwent coronary artery bypass grafting by Dr. [name] 04/07/2017 with a [name] to his LAD, vein to the obtuse marginal branch and second obtuse marginal branch.  He is done well since.  Paroxysmal atrial fibrillation (HCC) Perioperative A. fib on amiodarone without recurrence.  We will stop his amiodarone.      Runell GessJonathan J. Brianah Hopson MD FACP,FACC,FAHA, [facility] 08/03/2017 11:33 AM

## 2017-08-03 NOTE — Patient Instructions (Signed)
Medication Instructions:  Your physician has recommended you make the following change in your medication:  1) STOP Amiodarone    Labwork: none  Testing/Procedures: Your physician has requested that you have a lower extremity arterial doppler- During this test, ultrasound is used to evaluate arterial blood flow in the legs. Allow approximately one hour for this exam.  Your physician has requested that you have an ankle brachial index (ABI). During this test an ultrasound and blood pressure cuff are used to evaluate the arteries that supply the arms and legs with blood. Allow thirty minutes for this exam. There are no restrictions or special instructions.   Follow-Up: Your physician wants you to follow-up in: 12 months with Dr. Allyson SabalBerry. You will receive a reminder letter in the mail two months in advance. If you don't receive a letter, please call our office to schedule the follow-up appointment.   Any Other Special Instructions Will Be Listed Below (If Applicable).     If you need a refill on your cardiac medications before your next appointment, please call your pharmacy.

## 2017-08-03 NOTE — Assessment & Plan Note (Addendum)
Discontinued 3/19

## 2017-08-03 NOTE — Assessment & Plan Note (Signed)
History of essential hypertension her blood pressure measured at 106/62.  He is on metoprolol.  Continue current meds at current dosing.

## 2017-08-04 ENCOUNTER — Other Ambulatory Visit: Payer: Self-pay | Admitting: Cardiovascular Disease

## 2017-08-04 DIAGNOSIS — I739 Peripheral vascular disease, unspecified: Secondary | ICD-10-CM

## 2017-08-09 ENCOUNTER — Ambulatory Visit (HOSPITAL_COMMUNITY)
Admission: RE | Admit: 2017-08-09 | Payer: Medicaid Other | Source: Ambulatory Visit | Attending: Cardiovascular Disease | Admitting: Cardiovascular Disease

## 2017-08-09 ENCOUNTER — Ambulatory Visit (HOSPITAL_COMMUNITY): Payer: Medicaid Other

## 2017-08-20 ENCOUNTER — Encounter (HOSPITAL_COMMUNITY): Payer: Medicaid Other

## 2017-08-20 ENCOUNTER — Ambulatory Visit (HOSPITAL_COMMUNITY)
Admission: RE | Admit: 2017-08-20 | Discharge: 2017-08-20 | Disposition: A | Discharge status: Home / Self Care | Payer: Self-pay | Source: Ambulatory Visit | Attending: Cardiology | Admitting: Cardiology

## 2017-08-20 DIAGNOSIS — I739 Peripheral vascular disease, unspecified: Secondary | ICD-10-CM | POA: Insufficient documentation

## 2017-08-23 ENCOUNTER — Encounter: Payer: Self-pay | Admitting: *Deleted

## 2017-08-23 NOTE — Telephone Encounter (Addendum)
Left message for pt to call   ----- Message from Runell GessJonathan J Berry, MD sent at 08/23/2017  1:16 PM EDT ----- Return office visit with me to discuss results at next available

## 2017-09-01 NOTE — Telephone Encounter (Signed)
This encounter was created in error - please disregard.

## 2017-09-06 ENCOUNTER — Other Ambulatory Visit: Payer: Self-pay | Admitting: Physician Assistant

## 2017-09-07 ENCOUNTER — Other Ambulatory Visit: Payer: Self-pay

## 2017-09-07 MED ORDER — CLOPIDOGREL BISULFATE 75 MG PO TABS: 75.0000 mg | ORAL_TABLET | Freq: Every day | ORAL | 3 refills | Status: DC

## 2017-09-29 ENCOUNTER — Ambulatory Visit (INDEPENDENT_AMBULATORY_CARE_PROVIDER_SITE_OTHER): Payer: Self-pay | Admitting: Cardiovascular Disease

## 2017-09-29 ENCOUNTER — Other Ambulatory Visit: Payer: Self-pay | Admitting: Physician Assistant

## 2017-09-29 ENCOUNTER — Encounter: Payer: Self-pay | Admitting: Cardiovascular Disease

## 2017-09-29 VITALS — BP 110/72 | HR 62 | Ht 69.0 in | Wt 188.4 lb

## 2017-09-29 DIAGNOSIS — I739 Peripheral vascular disease, unspecified: Secondary | ICD-10-CM

## 2017-09-29 MED ORDER — METOPROLOL TARTRATE 25 MG PO TABS: 25.0000 mg | ORAL_TABLET | Freq: Two times a day (BID) | ORAL | 1 refills | Status: DC

## 2017-09-29 NOTE — Progress Notes (Signed)
Mr. Myerson returns today for follow-up of his recent Doppler studies performed 08/20/2017.  I restented his left common iliac artery 06/06/2015.  He had a 40% proximal right common iliac artery stenosis.  Recent Dopplers suggested an occluded right common iliac artery although his ABI was 0.92 on that side and he really denies claudication.  I am wondering if this was a false reading.  We will recheck Dopplers in a year prior to see me back.  Runell Gess, M.D., FACP, [facility], [facility] [address] [place], [place]  [address]  [phone] 09/29/2017 2:07 PM

## 2017-09-29 NOTE — Assessment & Plan Note (Signed)
Mr. Balthaser returns today for follow-up of his recent Doppler studies performed 08/20/2017.  I restented his left common iliac artery 06/06/2015.  He had a 40% proximal right common iliac artery stenosis.  Recent Dopplers suggested an occluded right common iliac artery although his ABI was 0.92 on that side and he really denies claudication.  I am wondering if this was a false reading.  We will recheck Dopplers in a year prior to see me back.

## 2017-09-29 NOTE — Patient Instructions (Signed)
Medication Instructions:  Your physician recommends that you continue on your current medications as directed. Please refer to the Current Medication list given to you today.   Labwork: none  Testing/Procedures: Your physician has requested that you have a lower extremity arterial exercise duplex. During this test, exercise and ultrasound are used to evaluate arterial blood flow in the legs. Allow one hour for this exam. There are no restrictions or special instructions. Your physician has requested that you have an ankle brachial index (ABI). During this test an ultrasound and blood pressure cuff are used to evaluate the arteries that supply the arms and legs with blood. Allow thirty minutes for this exam. There are no restrictions or special instructions. SCHEDULE AFTER 08/27/2018    Follow-Up: Your physician wants you to follow-up in: 12 months with Dr. Allyson Sabal. You will receive a reminder letter in the mail two months in advance. If you don't receive a letter, please call our office to schedule the follow-up appointment.   Any Other Special Instructions Will Be Listed Below (If Applicable).     If you need a refill on your cardiac medications before your next appointment, please call your pharmacy.

## 2017-11-10 ENCOUNTER — Encounter: Payer: Self-pay | Admitting: Cardiovascular Disease

## 2017-11-10 ENCOUNTER — Ambulatory Visit (INDEPENDENT_AMBULATORY_CARE_PROVIDER_SITE_OTHER): Payer: Self-pay | Admitting: Cardiovascular Disease

## 2017-11-10 DIAGNOSIS — I739 Peripheral vascular disease, unspecified: Secondary | ICD-10-CM

## 2017-11-10 NOTE — Patient Instructions (Signed)
Medication Instructions:  Your physician recommends that you continue on your current medications as directed. Please refer to the Current Medication list given to you today.  If you need a refill on your cardiac medications before your next appointment, please call your pharmacy.   Lab work: Your physician recommends that you return for lab work in: TODaY  If you have labs (blood work) drawn today and your tests are completely normal, you will receive your results only by: [name] MyChart Message (if you have MyChart) OR . A paper copy in the mail If you have any lab test that is abnormal or we need to change your treatment, we will call you to review the results.  Testing/Procedures: Your physician has requested that you have an aorta/iliac duplex. During this test, an ultrasound is used to evaluate blood flow to the aorta and iliac arteries. Allow one hour for this exam. Do not eat after midnight the day before and avoid carbonated beverages.   Your physician has requested that you have an ankle brachial index (ABI). During this test an ultrasound and blood pressure cuff are used to evaluate the arteries that supply the arms and legs with blood. Allow thirty minutes for this exam. There are no restrictions or special instructions.  Schedule for 1 week from 11/15/2017  Follow-Up: At [facility], you and your health needs are our priority.  As part of our continuing mission to provide you with exceptional heart care, we have created designated Provider Care Teams.  These Care Teams include your primary Cardiologist (physician) and Advanced Practice Providers (APPs -  Physician Assistants and Nurse Practitioners) who all work together to provide you with the care you need, when you need it. Please schedule follow up with Dr. Allyson Sabal in 2 weeks from 11/15/2017 procedure. Please call our office 2 months in advance to schedule this appointment.  You may see Dr. Allyson Sabal or one of the following Advanced  Practice Providers on your designated Care Team:   Corine Shelter, PA-C Judy Pimple, [place] . Marjie Skiff, PA-C  Any Other Special Instructions Will Be Listed Below (If Applicable).

## 2017-11-10 NOTE — Assessment & Plan Note (Signed)
History of peripheral arterial disease status post remote left iliac intervention by Dr. Jacinto Halim with re-intervention by me 06/06/2015.  He did have 40% stenosis of the right common iliac artery at that time.  His most recent Dopplers performed 08/20/2017 revealed an occluded right common iliac artery.  The patient does complain of some claudication of his right hip and wishes to proceed with percutaneous intervention for lifestyle limiting claudication.

## 2017-11-10 NOTE — H&P (View-Only) (Signed)
11/10/2017 [name] [name]   [birth year]  [id number]  Primary Physician Lorre Munroe, NP Primary Cardiologist: [name] [name]  HPI:  [name] [name] is a 47 y.o.  single Caucasian male father of one 35 year old son who is referred by [name] registered ,nurse practitioner, for peripheral vascular evaluation. I just saw him in the office  08/03/2017. He has a history of acute tobacco abuse having smoked 40-60 pack years, treated hypertension and hyperlipidemia. She has never had a heart attack or stroke and denies chest pain or shortness of breath. He saw [name] years ago and ultimately underwent angiography/ 05/16/13 revealing bilateral iliac disease both of which were stented with balloon expandable and self expanding stents. He enjoyed symptomatic relief for approximately one year and then developed recurrent left calf claudication. Recent Dopplers performed 05/02/15 revealed a left ABI 0.78 with a high-frequency signal in his left external iliac artery. I performed lower extremity angiography on him 06/06/15 revealing a patent left common and external iliac artery stent with a 95% stenosis at the distal edge of the proximal stent which I restented with a 9 mm x 3 cm nitinol self expanding stent. His symptoms of claudication have resolved and his Dopplers have normalized. He has in addition stop smoking by his account. He had a left main stent placed in [facility] August of last year and underwent repeat cardiac catheterization by Dr. 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  He underwent coronary artery bypass grafting x3 by Dr. [name] 04/07/2017 with a [name] to the LAD and vein to [name] and 2.  He is done well since.  He has stopped smoking.    Since I saw him 3 months ago he returns today with complaints of right hip claudication.  He does have a known occluded right common iliac artery by duplex ultrasound  performed 08/20/2017 and wishes to proceed with percutaneous intervention.  He denies chest pain or shortness of breath.   Current Meds  Medication Sig  . acetaminophen (TYLENOL) 500 MG tablet Take 2 tablets (1,000 mg total) by mouth every 6 (six) hours as needed.  [name] aspirin EC 81 MG tablet Take 1 tablet (81 mg total) by mouth daily.  [name] atorvastatin (LIPITOR) 80 MG tablet Take 1 tablet (80 mg total) by mouth at bedtime.  . clopidogrel (PLAVIX) 75 MG tablet Take 1 tablet (75 mg total) by mouth daily.  . famotidine (PEPCID) 20 MG tablet Take 20 mg by mouth daily as needed for heartburn or indigestion.  . metoprolol tartrate (LOPRESSOR) 25 MG tablet Take 1 tablet (25 mg total) by mouth 2 (two) times daily.     Allergies  Allergen Reactions  . Codeine Other (See Comments)    Migraine     Social History   Socioeconomic History  . Marital status: Divorced    Spouse name: Not on file  . Number of children: Not on file  . Years of education: Not on file  . Highest education level: Not on file  Occupational History  . Occupation: Truck Runner, broadcasting/film/video  . Financial resource strain: Not on file  . Food insecurity:    Worry: Not on file    Inability: Not on file  . Transportation needs:    Medical: Not on file    Non-medical: Not on file  Tobacco Use  . Smoking status: Former Smoker    Packs/day: 1.00  Years: 30.00    Pack years: 30.00    Types: Cigarettes    Last attempt to quit: 06/07/2015    Years since quitting: 2.4  . Smokeless tobacco: Former Neurosurgeon    Types: Snuff  . Tobacco comment: "used snuff in my teens"  Substance and Sexual Activity  . Alcohol use: Yes    Alcohol/week: 0.0 standard drinks    Comment: 06/06/2015 "might have 1 beer/month"  . Drug use: No  . Sexual activity: Not Currently  Lifestyle  . Physical activity:    Days per week: Not on file    Minutes per session: Not on file  . Stress: Not on file  Relationships  . Social connections:    Talks  on phone: Not on file    Gets together: Not on file    Attends religious service: Not on file    Active member of club or organization: Not on file    Attends meetings of clubs or organizations: Not on file    Relationship status: Not on file  . Intimate partner violence:    Fear of current or ex partner: Not on file    Emotionally abused: Not on file    Physically abused: Not on file    Forced sexual activity: Not on file  Other Topics Concern  . Not on file  Social History Narrative  . Not on file     Review of Systems: General: negative for chills, fever, night sweats or weight changes.  Cardiovascular: negative for chest pain, dyspnea on exertion, edema, orthopnea, palpitations, paroxysmal nocturnal dyspnea or shortness of breath Dermatological: negative for rash Respiratory: negative for cough or wheezing Urologic: negative for hematuria Abdominal: negative for nausea, vomiting, diarrhea, bright red blood per rectum, melena, or hematemesis Neurologic: negative for visual changes, syncope, or dizziness All other systems reviewed and are otherwise negative except as noted above.    Blood pressure 130/70, pulse 68, height 5\' 9"  (1.753 m), weight 186 lb (84.4 kg).  General appearance: alert and no distress Neck: no adenopathy, no carotid bruit, no JVD, supple, symmetrical, trachea midline and thyroid not enlarged, symmetric, no tenderness/mass/nodules Lungs: clear to auscultation bilaterally Heart: regular rate and rhythm, S1, S2 normal, no murmur, click, rub or gallop Extremities: extremities normal, atraumatic, no cyanosis or edema Pulses: 2+ and symmetric Skin: Skin color, texture, turgor normal. No rashes or lesions Neurologic: Alert and oriented X 3, normal strength and tone. Normal symmetric reflexes. Normal coordination and gait  EKG not performed today  ASSESSMENT AND PLAN:   PAD (peripheral artery disease) (HCC) History of peripheral arterial disease status post  remote left iliac intervention by Dr. Jacinto Halim with re-intervention by me 06/06/2015.  He did have 40% stenosis of the right common iliac artery at that time.  His most recent Dopplers performed 08/20/2017 revealed an occluded right common iliac artery.  The patient does complain of some claudication of his right hip and wishes to proceed with percutaneous intervention for lifestyle limiting claudication.      [name] FACP,FACC,FAHA, [facility] 11/10/2017 2:16 PM

## 2017-11-10 NOTE — Progress Notes (Signed)
   11/10/2017 [name] [name]   01/01/1971  [id number]  Primary Physician Baity, Regina W, NP Primary Cardiologist: Penne Rosenstock J Charly Hunton MD FACP, FACC, FAHA, FSCAI  HPI:  [name] [name] is a 46 y.o.  single Caucasian male father of one 15-year-old son who is referred by Regina Baity registered ,nurse practitioner, for peripheral vascular evaluation. I just saw him in the office  08/03/2017. He has a history of acute tobacco abuse having smoked 40-60 pack years, treated hypertension and hyperlipidemia. She has never had a heart attack or stroke and denies chest pain or shortness of breath. He saw Dr. Gangi years ago and ultimately underwent angiography/ 05/16/13 revealing bilateral iliac disease both of which were stented with balloon expandable and self expanding stents. He enjoyed symptomatic relief for approximately one year and then developed recurrent left calf claudication. Recent Dopplers performed 05/02/15 revealed a left ABI 0.78 with a high-frequency signal in his left external iliac artery. I performed lower extremity angiography on him 06/06/15 revealing a patent left common and external iliac artery stent with a 95% stenosis at the distal edge of the proximal stent which I restented with a 9 mm x 3 cm nitinol self expanding stent. His symptoms of claudication have resolved and his Dopplers have normalized. He has in addition stop smoking by his account. He had a left main stent placed in [place] August of last year and underwent repeat cardiac catheterization by Dr. Cooper in the setting of unstable angina 04/09/2017 revealing 80% "in-stent restenosis.  He underwent coronary artery bypass grafting x3 by Dr. Gerhardt 04/07/2017 with a [name] to the LAD and vein to [name] and 2.  He is done well since.  He has stopped smoking.    Since I saw him 3 months ago he returns today with complaints of right hip claudication.  He does have a known occluded right common iliac artery by duplex ultrasound  performed 08/20/2017 and wishes to proceed with percutaneous intervention.  He denies chest pain or shortness of breath.   Current Meds  Medication Sig  . acetaminophen (TYLENOL) 500 MG tablet Take 2 tablets (1,000 mg total) by mouth every 6 (six) hours as needed.  . aspirin EC 81 MG tablet Take 1 tablet (81 mg total) by mouth daily.  . atorvastatin (LIPITOR) 80 MG tablet Take 1 tablet (80 mg total) by mouth at bedtime.  . clopidogrel (PLAVIX) 75 MG tablet Take 1 tablet (75 mg total) by mouth daily.  . famotidine (PEPCID) 20 MG tablet Take 20 mg by mouth daily as needed for heartburn or indigestion.  . metoprolol tartrate (LOPRESSOR) 25 MG tablet Take 1 tablet (25 mg total) by mouth 2 (two) times daily.     Allergies  Allergen Reactions  . Codeine Other (See Comments)    Migraine     Social History   Socioeconomic History  . Marital status: Divorced    Spouse name: Not on file  . Number of children: Not on file  . Years of education: Not on file  . Highest education level: Not on file  Occupational History  . Occupation: Truck Driver  Social Needs  . Financial resource strain: Not on file  . Food insecurity:    Worry: Not on file    Inability: Not on file  . Transportation needs:    Medical: Not on file    Non-medical: Not on file  Tobacco Use  . Smoking status: Former Smoker    Packs/day: 1.00      Years: 30.00    Pack years: 30.00    Types: Cigarettes    Last attempt to quit: 06/07/2015    Years since quitting: 2.4  . Smokeless tobacco: Former User    Types: Snuff  . Tobacco comment: "used snuff in my teens"  Substance and Sexual Activity  . Alcohol use: Yes    Alcohol/week: 0.0 standard drinks    Comment: 06/06/2015 "might have 1 beer/month"  . Drug use: No  . Sexual activity: Not Currently  Lifestyle  . Physical activity:    Days per week: Not on file    Minutes per session: Not on file  . Stress: Not on file  Relationships  . Social connections:    Talks  on phone: Not on file    Gets together: Not on file    Attends religious service: Not on file    Active member of club or organization: Not on file    Attends meetings of clubs or organizations: Not on file    Relationship status: Not on file  . Intimate partner violence:    Fear of current or ex partner: Not on file    Emotionally abused: Not on file    Physically abused: Not on file    Forced sexual activity: Not on file  Other Topics Concern  . Not on file  Social History Narrative  . Not on file     Review of Systems: General: negative for chills, fever, night sweats or weight changes.  Cardiovascular: negative for chest pain, dyspnea on exertion, edema, orthopnea, palpitations, paroxysmal nocturnal dyspnea or shortness of breath Dermatological: negative for rash Respiratory: negative for cough or wheezing Urologic: negative for hematuria Abdominal: negative for nausea, vomiting, diarrhea, bright red blood per rectum, melena, or hematemesis Neurologic: negative for visual changes, syncope, or dizziness All other systems reviewed and are otherwise negative except as noted above.    Blood pressure 130/70, pulse 68, height 5' 9" (1.753 m), weight 186 lb (84.4 kg).  General appearance: alert and no distress Neck: no adenopathy, no carotid bruit, no JVD, supple, symmetrical, trachea midline and thyroid not enlarged, symmetric, no tenderness/mass/nodules Lungs: clear to auscultation bilaterally Heart: regular rate and rhythm, S1, S2 normal, no murmur, click, rub or gallop Extremities: extremities normal, atraumatic, no cyanosis or edema Pulses: 2+ and symmetric Skin: Skin color, texture, turgor normal. No rashes or lesions Neurologic: Alert and oriented X 3, normal strength and tone. Normal symmetric reflexes. Normal coordination and gait  EKG not performed today  ASSESSMENT AND PLAN:   PAD (peripheral artery disease) (HCC) History of peripheral arterial disease status post  remote left iliac intervention by Dr. Ganji with re-intervention by me 06/06/2015.  He did have 40% stenosis of the right common iliac artery at that time.  His most recent Dopplers performed 08/20/2017 revealed an occluded right common iliac artery.  The patient does complain of some claudication of his right hip and wishes to proceed with percutaneous intervention for lifestyle limiting claudication.      Treavon Castilleja J. Boden Stucky MD FACP,FACC,FAHA, FSCAI 11/10/2017 2:16 PM 

## 2017-11-10 NOTE — Addendum Note (Signed)
Addended by: Theressa Stamps on: 11/10/2017 03:00 PM   Modules accepted: Orders, SmartSet

## 2017-11-11 ENCOUNTER — Telehealth: Payer: Self-pay | Admitting: *Deleted

## 2017-11-11 ENCOUNTER — Telehealth: Payer: Self-pay

## 2017-11-11 LAB — BASIC METABOLIC PANEL
BUN / CREAT RATIO: 8 — AB (ref 9–20)
BUN: 10 mg/dL (ref 6–24)
CO2: 23 mmol/L (ref 20–29)
CREATININE: 1.19 mg/dL (ref 0.76–1.27)
Calcium: 8.7 mg/dL (ref 8.7–10.2)
Chloride: 106 mmol/L (ref 96–106)
GFR calc non Af Amer: 73 mL/min/{1.73_m2} (ref >59)
GFR, EST AFRICAN AMERICAN: 84 mL/min/{1.73_m2} (ref >59)
Glucose: 79 mg/dL (ref 65–99)
Potassium: 4.2 mmol/L (ref 3.5–5.2)
SODIUM: 142 mmol/L (ref 134–144)

## 2017-11-11 LAB — CBC WITH DIFFERENTIAL/PLATELET
BASOS: 1 %
Basophils Absolute: 0.1 10*3/uL (ref 0.0–0.2)
EOS (ABSOLUTE): 0.3 10*3/uL (ref 0.0–0.4)
EOS: 2 %
Hematocrit: 43.6 % (ref 37.5–51.0)
Hemoglobin: 14.6 g/dL (ref 13.0–17.7)
Immature Grans (Abs): 0.1 10*3/uL (ref 0.0–0.1)
Immature Granulocytes: 1 %
LYMPHS ABS: 3.8 10*3/uL — AB (ref 0.7–3.1)
Lymphs: 30 %
MCH: 28.2 pg (ref 26.6–33.0)
MCHC: 33.5 g/dL (ref 31.5–35.7)
MCV: 84 fL (ref 79–97)
MONOS ABS: 1.1 10*3/uL — AB (ref 0.1–0.9)
Monocytes: 9 %
NEUTROS ABS: 7.5 10*3/uL — AB (ref 1.4–7.0)
Neutrophils: 57 %
Platelets: 271 10*3/uL (ref 150–450)
RBC: 5.18 x10E6/uL (ref 4.14–5.80)
RDW: 15.9 % — AB (ref 12.3–15.4)
WBC: 12.8 10*3/uL — AB (ref 3.4–10.8)

## 2017-11-11 LAB — TSH: TSH: 1.73 u[IU]/mL (ref 0.450–4.500)

## 2017-11-11 NOTE — Telephone Encounter (Signed)
Spoke to patient lab results given.Stated he was confused about medications to take before pv cath.Advised to take plavix and aspirin with a sip of water morning of cath.

## 2017-11-11 NOTE — Telephone Encounter (Signed)
Pt contacted pre-PV procedure scheduled at [facility] for: Monday November 15, 2017 9:30 AM Verified arrival time and place: [facility] Main Entrance A at: 7:30 AM  No solid food after midnight prior to cath, clear liquids until 5 AM day of procedure. Contrast allergy: no No diabetes medications.  AM meds can be  taken pre-cath with sip of water including: ASA 81 mg Clopidogrel 75 mg   Confirmed patient has responsible person to drive home post procedure and for 24 hours after you arrive home: yes

## 2017-11-15 ENCOUNTER — Other Ambulatory Visit: Payer: Self-pay

## 2017-11-15 ENCOUNTER — Encounter (HOSPITAL_COMMUNITY)
Admission: RE | Disposition: A | Discharge status: Home / Self Care | Payer: Self-pay | Source: Ambulatory Visit | Attending: Cardiovascular Disease

## 2017-11-15 ENCOUNTER — Encounter (HOSPITAL_COMMUNITY): Payer: Self-pay | Admitting: General Practice

## 2017-11-15 ENCOUNTER — Ambulatory Visit (HOSPITAL_COMMUNITY)
Admission: RE | Admit: 2017-11-15 | Discharge: 2017-11-16 | Disposition: A | Discharge status: Home / Self Care | Payer: Medicaid Other | Source: Ambulatory Visit | Attending: Cardiovascular Disease | Admitting: Cardiovascular Disease

## 2017-11-15 DIAGNOSIS — Z951 Presence of aortocoronary bypass graft: Secondary | ICD-10-CM | POA: Insufficient documentation

## 2017-11-15 DIAGNOSIS — E785 Hyperlipidemia, unspecified: Secondary | ICD-10-CM | POA: Insufficient documentation

## 2017-11-15 DIAGNOSIS — I10 Essential (primary) hypertension: Secondary | ICD-10-CM | POA: Insufficient documentation

## 2017-11-15 DIAGNOSIS — I739 Peripheral vascular disease, unspecified: Secondary | ICD-10-CM

## 2017-11-15 DIAGNOSIS — F17210 Nicotine dependence, cigarettes, uncomplicated: Secondary | ICD-10-CM | POA: Insufficient documentation

## 2017-11-15 DIAGNOSIS — I70211 Atherosclerosis of native arteries of extremities with intermittent claudication, right leg: Secondary | ICD-10-CM

## 2017-11-15 DIAGNOSIS — Z7902 Long term (current) use of antithrombotics/antiplatelets: Secondary | ICD-10-CM | POA: Insufficient documentation

## 2017-11-15 HISTORY — PX: LOWER EXTREMITY INTERVENTION: CATH118252

## 2017-11-15 HISTORY — PX: LOWER EXTREMITY ANGIOGRAPHY: CATH118251

## 2017-11-15 LAB — POCT ACTIVATED CLOTTING TIME
ACTIVATED CLOTTING TIME: 175 s
Activated Clotting Time: 285 seconds

## 2017-11-15 SURGERY — LOWER EXTREMITY INTERVENTION
Anesthesia: LOCAL | Laterality: Right

## 2017-11-15 MED ORDER — LABETALOL HCL 5 MG/ML IV SOLN: 10.0000 mg | INTRAVENOUS | Status: DC | PRN

## 2017-11-15 MED ORDER — HYDRALAZINE HCL 20 MG/ML IJ SOLN
INTRAMUSCULAR | Status: DC | PRN
  Administered 2017-11-15: 10 mg via INTRAVENOUS

## 2017-11-15 MED ORDER — FAMOTIDINE 20 MG PO TABS: 20.0000 mg | ORAL_TABLET | Freq: Every day | ORAL | Status: DC

## 2017-11-15 MED ORDER — SODIUM CHLORIDE 0.9 % WEIGHT BASED INFUSION
3.0000 mL/kg/h | INTRAVENOUS | Status: DC
  Administered 2017-11-15: 3 mL/kg/h via INTRAVENOUS

## 2017-11-15 MED ORDER — LIDOCAINE HCL (PF) 1 % IJ SOLN
INTRAMUSCULAR | Status: DC | PRN
  Administered 2017-11-15: 29 mL

## 2017-11-15 MED ORDER — NITROGLYCERIN 1 MG/10 ML FOR IR/CATH LAB
INTRA_ARTERIAL | Status: AC
  Filled 2017-11-15: qty 10

## 2017-11-15 MED ORDER — SODIUM CHLORIDE 0.9% FLUSH: 3.0000 mL | Freq: Two times a day (BID) | INTRAVENOUS | Status: DC

## 2017-11-15 MED ORDER — ACETAMINOPHEN 325 MG PO TABS: 650.0000 mg | ORAL_TABLET | ORAL | Status: DC | PRN

## 2017-11-15 MED ORDER — HEPARIN SODIUM (PORCINE) 1000 UNIT/ML IJ SOLN
INTRAMUSCULAR | Status: DC | PRN
  Administered 2017-11-15: 8000 [IU] via INTRAVENOUS

## 2017-11-15 MED ORDER — SODIUM CHLORIDE 0.9 % IV SOLN
INTRAVENOUS | Status: AC
  Administered 2017-11-15: 15:00:00 via INTRAVENOUS

## 2017-11-15 MED ORDER — SODIUM CHLORIDE 0.9 % IV SOLN: 250.0000 mL | INTRAVENOUS | Status: DC | PRN

## 2017-11-15 MED ORDER — HEPARIN SODIUM (PORCINE) 1000 UNIT/ML IJ SOLN
INTRAMUSCULAR | Status: AC
  Filled 2017-11-15: qty 1

## 2017-11-15 MED ORDER — HEPARIN (PORCINE) IN NACL 1000-0.9 UT/500ML-% IV SOLN
INTRAVENOUS | Status: AC
  Filled 2017-11-15: qty 1000

## 2017-11-15 MED ORDER — ACETAMINOPHEN 500 MG PO TABS
1000.0000 mg | ORAL_TABLET | Freq: Four times a day (QID) | ORAL | Status: DC | PRN
  Administered 2017-11-15: 1000 mg via ORAL

## 2017-11-15 MED ORDER — IODIXANOL 320 MG/ML IV SOLN
INTRAVENOUS | Status: DC | PRN
  Administered 2017-11-15: 100 mL via INTRAVENOUS

## 2017-11-15 MED ORDER — SODIUM CHLORIDE 0.9 % WEIGHT BASED INFUSION: 1.0000 mL/kg/h | INTRAVENOUS | Status: DC

## 2017-11-15 MED ORDER — HYDRALAZINE HCL 20 MG/ML IJ SOLN
INTRAMUSCULAR | Status: AC
  Filled 2017-11-15: qty 1

## 2017-11-15 MED ORDER — MORPHINE SULFATE (PF) 2 MG/ML IV SOLN
2.0000 mg | Freq: Once | INTRAVENOUS | Status: AC
  Administered 2017-11-15: 11:00:00 2 mg via INTRAVENOUS
  Filled 2017-11-15: qty 1

## 2017-11-15 MED ORDER — SODIUM CHLORIDE 0.9% FLUSH: 3.0000 mL | INTRAVENOUS | Status: DC | PRN

## 2017-11-15 MED ORDER — ASPIRIN EC 81 MG PO TBEC
81.0000 mg | DELAYED_RELEASE_TABLET | Freq: Every day | ORAL | Status: DC
  Administered 2017-11-16: 10:00:00 81 mg via ORAL
  Filled 2017-11-15: qty 1

## 2017-11-15 MED ORDER — LIDOCAINE HCL (PF) 1 % IJ SOLN
INTRAMUSCULAR | Status: AC
  Filled 2017-11-15: qty 30

## 2017-11-15 MED ORDER — ONDANSETRON HCL 4 MG/2ML IJ SOLN: 4.0000 mg | Freq: Four times a day (QID) | INTRAMUSCULAR | Status: DC | PRN

## 2017-11-15 MED ORDER — MIDAZOLAM HCL 2 MG/2ML IJ SOLN
INTRAMUSCULAR | Status: AC
  Filled 2017-11-15: qty 2

## 2017-11-15 MED ORDER — CLOPIDOGREL BISULFATE 75 MG PO TABS
75.0000 mg | ORAL_TABLET | ORAL | Status: AC
  Administered 2017-11-15: 75 mg via ORAL
  Filled 2017-11-15: qty 1

## 2017-11-15 MED ORDER — ASPIRIN EC 81 MG PO TBEC: 81.0000 mg | DELAYED_RELEASE_TABLET | Freq: Every day | ORAL | Status: DC

## 2017-11-15 MED ORDER — FENTANYL CITRATE (PF) 100 MCG/2ML IJ SOLN
INTRAMUSCULAR | Status: DC | PRN
  Administered 2017-11-15 (×3): 25 ug via INTRAVENOUS

## 2017-11-15 MED ORDER — FENTANYL CITRATE (PF) 100 MCG/2ML IJ SOLN
INTRAMUSCULAR | Status: AC
  Filled 2017-11-15: qty 2

## 2017-11-15 MED ORDER — ATORVASTATIN CALCIUM 80 MG PO TABS
80.0000 mg | ORAL_TABLET | Freq: Every day | ORAL | Status: DC
  Administered 2017-11-15: 22:00:00 80 mg via ORAL
  Filled 2017-11-15: qty 1

## 2017-11-15 MED ORDER — METOPROLOL TARTRATE 25 MG PO TABS
25.0000 mg | ORAL_TABLET | Freq: Two times a day (BID) | ORAL | Status: DC
  Administered 2017-11-15 – 2017-11-16 (×3): 25 mg via ORAL
  Filled 2017-11-15 (×3): qty 1

## 2017-11-15 MED ORDER — SODIUM CHLORIDE 0.9% FLUSH
3.0000 mL | Freq: Two times a day (BID) | INTRAVENOUS | Status: DC
  Administered 2017-11-15 (×2): 3 mL via INTRAVENOUS

## 2017-11-15 MED ORDER — CLOPIDOGREL BISULFATE 75 MG PO TABS
75.0000 mg | ORAL_TABLET | Freq: Every day | ORAL | Status: DC
  Administered 2017-11-16: 75 mg via ORAL
  Filled 2017-11-15: qty 1

## 2017-11-15 MED ORDER — HYDRALAZINE HCL 20 MG/ML IJ SOLN: 5.0000 mg | INTRAMUSCULAR | Status: DC | PRN

## 2017-11-15 MED ORDER — HEPARIN (PORCINE) IN NACL 1000-0.9 UT/500ML-% IV SOLN
INTRAVENOUS | Status: DC | PRN
  Administered 2017-11-15 (×2): 500 mL

## 2017-11-15 MED ORDER — ANGIOPLASTY BOOK
Freq: Once | Status: AC
  Administered 2017-11-15: 22:00:00
  Filled 2017-11-15: qty 1

## 2017-11-15 MED ORDER — CLOPIDOGREL BISULFATE 75 MG PO TABS: 75.0000 mg | ORAL_TABLET | Freq: Every day | ORAL | Status: DC

## 2017-11-15 MED ORDER — ACETAMINOPHEN 500 MG PO TABS
ORAL_TABLET | ORAL | Status: AC
  Filled 2017-11-15: qty 2

## 2017-11-15 MED ORDER — MIDAZOLAM HCL 2 MG/2ML IJ SOLN
INTRAMUSCULAR | Status: DC | PRN
  Administered 2017-11-15 (×3): 1 mg via INTRAVENOUS

## 2017-11-15 MED ORDER — ASPIRIN 81 MG PO CHEW
81.0000 mg | CHEWABLE_TABLET | ORAL | Status: AC
  Administered 2017-11-15: 81 mg via ORAL
  Filled 2017-11-15: qty 1

## 2017-11-15 SURGICAL SUPPLY — 17 items
BALLN MUSTANG 8X20X75 (BALLOONS) ×3
BALLOON MUSTANG 8X20X75 (BALLOONS) ×2 IMPLANT
CATH ANGIO 5F PIGTAIL 65CM (CATHETERS) ×3 IMPLANT
CATH STRAIGHT 5FR 65CM (CATHETERS) ×3 IMPLANT
DEVICE CONTINUOUS FLUSH (MISCELLANEOUS) ×3 IMPLANT
KIT ENCORE 26 ADVANTAGE (KITS) ×3 IMPLANT
KIT PV (KITS) ×3 IMPLANT
SHEATH BRITE TIP 7FR 35CM (SHEATH) ×3 IMPLANT
SHEATH PINNACLE 5F 10CM (SHEATH) ×3 IMPLANT
SHEATH PINNACLE 7F 10CM (SHEATH) ×3 IMPLANT
STENT OMNILINK ELITE 7X29X80 (Permanent Stent) ×3 IMPLANT
STOPCOCK MORSE 400PSI 3WAY (MISCELLANEOUS) ×3 IMPLANT
SYRINGE MEDRAD AVANTA MACH 7 (SYRINGE) ×3 IMPLANT
TRANSDUCER W/STOPCOCK (MISCELLANEOUS) ×3 IMPLANT
TRAY PV CATH (CUSTOM PROCEDURE TRAY) ×3 IMPLANT
TUBING CIL FLEX 10 FLL-RA (TUBING) ×3 IMPLANT
WIRE HITORQ VERSACORE ST 145CM (WIRE) ×3 IMPLANT

## 2017-11-15 NOTE — Progress Notes (Addendum)
Site area: right groin  Site Prior to Removal:  Level 0  Pressure Applied For 20 MINUTES    Minutes Beginning at 1122 AM  Manual:   Yes.    Patient Status During Pull:  WNL  Post Pull Groin Site:  Level 0  Post Pull Instructions Given:  Yes.    Post Pull Pulses Present:  Yes.    Dressing Applied:  Yes.    Comments:  Tolerated procedure well

## 2017-11-15 NOTE — Interval H&P Note (Signed)
History and Physical Interval Note:  11/15/2017 7:41 AM  [name] [name]  has presented today for surgery, with the diagnosis of claudication  The various methods of treatment have been discussed with the patient and family. After consideration of risks, benefits and other options for treatment, the patient has consented to  Procedure(s): LOWER EXTREMITY INTERVENTION (Right) as a surgical intervention .  The patient's history has been reviewed, patient examined, no change in status, stable for surgery.  I have reviewed the patient's chart and labs.  Questions were answered to the patient's satisfaction.     Nanetta Batty

## 2017-11-16 DIAGNOSIS — I739 Peripheral vascular disease, unspecified: Secondary | ICD-10-CM

## 2017-11-16 LAB — BASIC METABOLIC PANEL
Anion gap: 8 (ref 5–15)
BUN: 11 mg/dL (ref 6–20)
CALCIUM: 8.4 mg/dL — AB (ref 8.9–10.3)
CO2: 21 mmol/L — ABNORMAL LOW (ref 22–32)
CREATININE: 1.1 mg/dL (ref 0.61–1.24)
Chloride: 108 mmol/L (ref 98–111)
GFR calc Af Amer: >60 mL/min (ref >60)
GLUCOSE: 135 mg/dL — AB (ref 70–99)
Potassium: 4.2 mmol/L (ref 3.5–5.1)
Sodium: 137 mmol/L (ref 135–145)

## 2017-11-16 LAB — CBC
HCT: 42.8 % (ref 39.0–52.0)
HEMOGLOBIN: 14 g/dL (ref 13.0–17.0)
MCH: 28.3 pg (ref 26.0–34.0)
MCHC: 32.7 g/dL (ref 30.0–36.0)
MCV: 86.6 fL (ref 80.0–100.0)
Platelets: 277 10*3/uL (ref 150–400)
RBC: 4.94 MIL/uL (ref 4.22–5.81)
RDW: 16.1 % — AB (ref 11.5–15.5)
WBC: 11.5 10*3/uL — ABNORMAL HIGH (ref 4.0–10.5)
nRBC: 0 % (ref 0.0–0.2)

## 2017-11-16 MED FILL — Nitroglycerin IV Soln 100 MCG/ML in D5W: INTRA_ARTERIAL | Qty: 10 | Status: AC

## 2017-11-16 NOTE — Discharge Summary (Addendum)
Discharge Summary    Patient ID: [name] [name],  MRN: [id number], DOB/AGE: 47-Feb-1972 47 y.o.  Admit date: 11/15/2017 Discharge date: 11/16/2017  Primary Care Provider: Lorre [name] Primary Cardiologist: Dr. Allyson [name] (PV)  Discharge Diagnoses    Active Problems:   Claudication in peripheral vascular disease (HCC)   Allergies Allergies  Allergen Reactions  . Codeine Other (See Comments)    Migraine     Diagnostic Studies/Procedures    PV angiogram: 11/15/17  Angiographic Data:   1: Abdominal aorta- widely patent 2: Left lower extremity- widely patent left common and external iliac artery stents 3: Right lower extremity- 50 to 60% segmental proximal right common iliac artery stenosis with with a 40 mm pullback gradient, 50% proximal right external iliac artery stenosis without a pullback gradient  Final Impression: Successful PTA and balloon expandable stenting of a physiologically significant proximal right common iliac artery stenosis in the setting of lifestyle limiting claudication with patent left common and external iliac artery stents.  50% right external iliac artery stenosis did not have a gradient suggesting that it was not physiologically significant.  The sheath be removed and pressure held once ACT for falls below 170.  The patient is already on dual anti-failure therapy.  He will be hydrated overnight and discharged home in the morning.  We will get lower extremity arterial Doppler studies in our [facility] line office next week and I will see him back in 2 to 3 weeks thereafter.  He left the lab in stable condition.  [name] [name]. MD, [facility] _____________   History of Present Illness     47 y.o.  single Caucasian male who was referred for peripheral vascular evaluation. He had a left main stent placed in [facility] August of last year and underwent repeat cardiac catheterization by Dr. Excell [name] in the setting of unstable angina 04/09/2017 revealing 80%  "in-stent restenosis. He underwent coronary artery bypass grafting x3 by Dr. Tyrone [name] 04/07/2017 with a [name] to the LAD and vein to [name] and 2. He is done well since. He was seen by Dr. Allyson [name] in the office on 10/23 with complaints of right hip claudication.  He did have a known occluded right common iliac artery by duplex ultrasound performed 08/20/2017 and wished to proceed with percutaneous intervention.  He denies chest pain or shortness of breath. Was set up for outpatient PV angiogram.   Hospital Course     Underwent PV angiogram noted above with successful PTA and balloon expandable stenting of a  proximal right common iliac artery stenosis in the setting of lifestyle limiting claudication with patent left common and external iliac artery stents. 50% right external iliac artery stenosis did not have a gradient suggesting that it was not physiologically significant. He was already on DAPT with ASA/plavix prior to admission. Morning labs were stable. No complication noted post cath. Able to ambulate without difficulty.   General: Well developed, well nourished, male appearing in no acute distress. Head: Normocephalic, atraumatic.  Neck: Supple without bruits, JVD. Lungs:  Resp regular and unlabored, CTA. Heart: RRR, S1, S2, no S3, S4, or murmur; no rub. Abdomen: Soft, non-tender, non-distended with normoactive bowel sounds. No hepatomegaly. No rebound/guarding. No obvious abdominal masses. Extremities: No clubbing, cyanosis, edema. Distal pedal pulses are 2+ bilaterally. R femoral cath site stable without bruising or hematoma Neuro: Alert and oriented X 3. Moves all extremities spontaneously. Psych: Normal affect.  [name] [name] was seen by Dr. Clifton [name] and determined stable  for discharge home. Follow up in the office has been arranged. Medications are listed below.   _____________  Discharge Vitals Blood pressure 138/71, pulse (!) 59, temperature 97.7 F (36.5 C), temperature source  Oral, resp. rate 13, height 5\' 9"  (1.753 m), weight 87 kg, SpO2 97 %.  Filed Weights   11/15/17 0539 11/16/17 0538  Weight: 86.2 kg 87 kg    Labs & Radiologic Studies    CBC Recent Labs    11/16/17 0618  WBC 11.5*  HGB 14.0  HCT 42.8  MCV 86.6  PLT 277   Basic Metabolic Panel Recent Labs    13/08/65 0618  NA 137  K 4.2  CL 108  CO2 21*  GLUCOSE 135*  BUN 11  CREATININE 1.10  CALCIUM 8.4*   Liver Function Tests No results for input(s): AST, ALT, ALKPHOS, BILITOT, PROT, ALBUMIN in the last 72 hours. No results for input(s): LIPASE, AMYLASE in the last 72 hours. Cardiac Enzymes No results for input(s): CKTOTAL, CKMB, CKMBINDEX, TROPONINI in the last 72 hours. BNP Invalid input(s): POCBNP D-Dimer No results for input(s): DDIMER in the last 72 hours. Hemoglobin A1C No results for input(s): HGBA1C in the last 72 hours. Fasting Lipid Panel No results for input(s): CHOL, HDL, LDLCALC, TRIG, CHOLHDL, LDLDIRECT in the last 72 hours. Thyroid Function Tests No results for input(s): TSH, T4TOTAL, T3FREE, THYROIDAB in the last 72 hours.  Invalid input(s): FREET3 _____________  No results found. Disposition   Pt is being discharged home today in good condition.  Follow-up Plans & Appointments    Follow-up Information    [facility] Follow up on 11/22/2017.   Specialty:  Cardiology Why:  at 11am for your follow up dopplers.  Contact information: [address] [place] [address] [phone]       Runell Gess, MD Follow up on 12/07/2017.   Specialties:  Cardiology, Radiology Why:  at 9am for your follow up appt.  Contact information: [address] [place] [address] [phone]          Discharge Instructions    Diet - low sodium heart healthy   Complete by:  As directed    Discharge instructions   Complete by:  As directed    Groin Site Care Refer to this sheet in the next few weeks.  These instructions provide you with information on caring for yourself after your procedure. Your caregiver may also give you more specific instructions. Your treatment has been planned according to current medical practices, but problems sometimes occur. Call your caregiver if you have any problems or questions after your procedure. HOME CARE INSTRUCTIONS You may shower 24 hours after the procedure. Remove the bandage (dressing) and gently wash the site with plain soap and water. Gently pat the site dry.  Do not apply powder or lotion to the site.  Do not sit in a bathtub, swimming pool, or whirlpool for 5 to 7 days.  No bending, squatting, or lifting anything over 10 pounds (4.5 kg) as directed by your caregiver.  Inspect the site at least twice daily.  Do not drive home if you are discharged the same day of the procedure. Have someone else drive you.  You may drive 24 hours after the procedure unless otherwise instructed by your caregiver.  What to expect: Any bruising will usually fade within 1 to 2 weeks.  Blood that collects in the tissue (hematoma) may be painful to the touch. It should  usually decrease in size and tenderness within 1 to 2 weeks.  SEEK IMMEDIATE MEDICAL CARE IF: You have unusual pain at the groin site or down the affected leg.  You have redness, warmth, swelling, or pain at the groin site.  You have drainage (other than a small amount of blood on the dressing).  You have chills.  You have a fever or persistent symptoms for more than 72 hours.  You have a fever and your symptoms suddenly get worse.  Your leg becomes pale, cool, tingly, or numb.  You have heavy bleeding from the site. Hold pressure on the site. [name]  PLEASE DO NOT MISS ANY DOSES OF YOUR PLAVIX!!!!! Also keep a log of you blood pressures and bring back to your follow up appt. Please call the office with any questions.   Patients taking blood thinners should generally stay away from medicines like ibuprofen,  Advil, Motrin, naproxen, and Aleve due to risk of stomach bleeding. You may take Tylenol as directed or talk to your primary doctor about alternatives.   Increase activity slowly   Complete by:  As directed      Discharge Medications     Medication List    STOP taking these medications   naproxen sodium 220 MG tablet Commonly known as:  ALEVE     TAKE these medications   acetaminophen 500 MG tablet Commonly known as:  TYLENOL Take 2 tablets (1,000 mg total) by mouth every 6 (six) hours as needed.   aspirin EC 81 MG tablet Take 1 tablet (81 mg total) by mouth daily.   atorvastatin 80 MG tablet Commonly known as:  LIPITOR Take 1 tablet (80 mg total) by mouth at bedtime.   clopidogrel 75 MG tablet Commonly known as:  PLAVIX Take 1 tablet (75 mg total) by mouth daily.   metoprolol tartrate 25 MG tablet Commonly known as:  LOPRESSOR Take 1 tablet (25 mg total) by mouth 2 (two) times daily.   NASACORT ALLERGY 24HR NA Place 1 spray into the nose daily as needed (allergies).   ranitidine 75 MG tablet Commonly known as:  ZANTAC Take 75 mg by mouth daily as needed for heartburn.       Acute coronary syndrome (MI, NSTEMI, STEMI, etc) this admission?: No.     Outstanding Labs/Studies   Follow up dopplers  Duration of Discharge Encounter   Greater than 30 minutes including physician time.  Signed, Laverda Page NP-C 11/16/2017, 8:56 AM   I have personally seen and examined this patient. I agree with the assessment and plan as outlined above.  He is s/p stenting of the right common iliac artery per Dr. Allyson [name]. Doing well this am.  Will d/c home today on ASA/Plavix and statin.   Verne Carrow 11/16/2017 9:39 AM

## 2017-11-18 ENCOUNTER — Encounter (HOSPITAL_COMMUNITY): Payer: Self-pay | Admitting: Cardiovascular Disease

## 2017-11-22 ENCOUNTER — Ambulatory Visit (HOSPITAL_COMMUNITY)
Admission: RE | Admit: 2017-11-22 | Discharge: 2017-11-22 | Disposition: A | Discharge status: Home / Self Care | Payer: Medicaid Other | Source: Ambulatory Visit | Attending: Internal Medicine | Admitting: Internal Medicine

## 2017-11-22 ENCOUNTER — Ambulatory Visit (HOSPITAL_COMMUNITY)
Admission: RE | Admit: 2017-11-22 | Discharge: 2017-11-22 | Disposition: A | Discharge status: Home / Self Care | Payer: Self-pay | Source: Ambulatory Visit | Attending: Internal Medicine | Admitting: Internal Medicine

## 2017-11-22 ENCOUNTER — Other Ambulatory Visit: Payer: Self-pay | Admitting: Cardiovascular Disease

## 2017-11-22 DIAGNOSIS — Z95820 Peripheral vascular angioplasty status with implants and grafts: Secondary | ICD-10-CM | POA: Insufficient documentation

## 2017-11-22 DIAGNOSIS — I739 Peripheral vascular disease, unspecified: Secondary | ICD-10-CM

## 2017-12-07 ENCOUNTER — Ambulatory Visit (INDEPENDENT_AMBULATORY_CARE_PROVIDER_SITE_OTHER): Payer: Self-pay | Admitting: Cardiovascular Disease

## 2017-12-07 ENCOUNTER — Encounter: Payer: Self-pay | Admitting: Cardiovascular Disease

## 2017-12-07 DIAGNOSIS — E7800 Pure hypercholesterolemia, unspecified: Secondary | ICD-10-CM

## 2017-12-07 DIAGNOSIS — I739 Peripheral vascular disease, unspecified: Secondary | ICD-10-CM

## 2017-12-07 DIAGNOSIS — I10 Essential (primary) hypertension: Secondary | ICD-10-CM

## 2017-12-07 MED ORDER — METOPROLOL TARTRATE 25 MG PO TABS: 25.0000 mg | ORAL_TABLET | Freq: Two times a day (BID) | ORAL | 3 refills | Status: DC

## 2017-12-07 MED ORDER — ATORVASTATIN CALCIUM 80 MG PO TABS: 80.0000 mg | ORAL_TABLET | Freq: Every day | ORAL | 3 refills | Status: DC

## 2017-12-07 NOTE — Progress Notes (Signed)
12/07/2017 [name] [name]   [birth year]  [id number]  Primary Physician Lorre Munroe, NP Primary Cardiologist: [name] [name]  HPI:  [name] [name] is a 47 y.o.  father of one 55 year old son who is referred by [name] registered ,nurse practitioner, for peripheral vascular evaluation. I last saw him in the office10/23/2019. He has a history of acute tobacco abuse having smoked 40-60 pack years, treated hypertension and hyperlipidemia. She has never had a heart attack or stroke and denies chest pain or shortness of breath. He saw [name] years ago and ultimately underwent angiography/ 05/16/13 revealing bilateral iliac disease both of which were stented with balloon expandable and self expanding stents. He enjoyed symptomatic relief for approximately one year and then developed recurrent left calf claudication. Recent Dopplers performed 05/02/15 revealed a left ABI 0.78 with a high-frequency signal in his left external iliac artery. I performed lower extremity angiography on him 06/06/15 revealing a patent left common and external iliac artery stent with a 95% stenosis at the distal edge of the proximal stent which I restented with a 9 mm x 3 cm nitinol self expanding stent. His symptoms of claudication have resolved and his Dopplers have normalized. He has in addition stop smoking by his account. He had a left main stent placed in [facility] August of last year and underwent repeat cardiac catheterization by Dr. 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 He underwent coronary artery bypass grafting x3 by Dr. [name] 04/07/2017 with a [name] to the LAD and vein to [name] and 2. He is done well since. He has stopped smoking.  I performed peripheral angiography and intervention on him 11/07/2017 with stenting of his right common iliac artery with a 7 mm x 29 mm long balloon expandable stent.  The previous stent I  placed in his left leg were widely patent angiographically.  His follow-up Doppler studies normalized and his claudication has resolved..   Current Meds  Medication Sig  . aspirin EC 81 MG tablet Take 1 tablet (81 mg total) by mouth daily.  [name] atorvastatin (LIPITOR) 80 MG tablet Take 1 tablet (80 mg total) by mouth at bedtime.  . clopidogrel (PLAVIX) 75 MG tablet Take 1 tablet (75 mg total) by mouth daily.  . metoprolol tartrate (LOPRESSOR) 25 MG tablet Take 1 tablet (25 mg total) by mouth 2 (two) times daily.  . ranitidine (ZANTAC) 75 MG tablet Take 75 mg by mouth daily as needed for heartburn.  . Triamcinolone Acetonide (NASACORT ALLERGY 24HR NA) Place 1 spray into the nose daily as needed (allergies).     Allergies  Allergen Reactions  . Codeine Other (See Comments)    Migraine     Social History   Socioeconomic History  . Marital status: Divorced    Spouse name: Not on file  . Number of children: Not on file  . Years of education: Not on file  . Highest education level: Not on file  Occupational History  . Occupation: Truck Runner, broadcasting/film/video  . Financial resource strain: Not on file  . Food insecurity:    Worry: Not on file    Inability: Not on file  . Transportation needs:    Medical: Not on file    Non-medical: Not on file  Tobacco Use  . Smoking status: Former Smoker    Packs/day: 1.00    Years: 30.00    Pack years:  30.00    Types: Cigarettes    Last attempt to quit: 06/07/2015    Years since quitting: 2.5  . Smokeless tobacco: Former NeurosurgeonUser    Types: Snuff  . Tobacco comment: "used snuff in my teens"  Substance and Sexual Activity  . Alcohol use: Yes    Alcohol/week: 0.0 standard drinks    Comment: 06/06/2015 "might have 1 beer/month"  . Drug use: No  . Sexual activity: Not Currently  Lifestyle  . Physical activity:    Days per week: Not on file    Minutes per session: Not on file  . Stress: Not on file  Relationships  . Social connections:    Talks  on phone: Not on file    Gets together: Not on file    Attends religious service: Not on file    Active member of club or organization: Not on file    Attends meetings of clubs or organizations: Not on file    Relationship status: Not on file  . Intimate partner violence:    Fear of current or ex partner: Not on file    Emotionally abused: Not on file    Physically abused: Not on file    Forced sexual activity: Not on file  Other Topics Concern  . Not on file  Social History Narrative  . Not on file     Review of Systems: General: negative for chills, fever, night sweats or weight changes.  Cardiovascular: negative for chest pain, dyspnea on exertion, edema, orthopnea, palpitations, paroxysmal nocturnal dyspnea or shortness of breath Dermatological: negative for rash Respiratory: negative for cough or wheezing Urologic: negative for hematuria Abdominal: negative for nausea, vomiting, diarrhea, bright red blood per rectum, melena, or hematemesis Neurologic: negative for visual changes, syncope, or dizziness All other systems reviewed and are otherwise negative except as noted above.    Blood pressure 122/74, pulse (!) 59, height 5\' 9"  (1.753 m), weight 190 lb (86.2 kg), SpO2 97 %.  General appearance: alert and no distress Neck: no adenopathy, no carotid bruit, no JVD, supple, symmetrical, trachea midline and thyroid not enlarged, symmetric, no tenderness/mass/nodules Lungs: clear to auscultation bilaterally Heart: regular rate and rhythm, S1, S2 normal, no murmur, click, rub or gallop Extremities: extremities normal, atraumatic, no cyanosis or edema Pulses: 2+ and symmetric Skin: Skin color, texture, turgor normal. No rashes or lesions Neurologic: Alert and oriented X 3, normal strength and tone. Normal symmetric reflexes. Normal coordination and gait  EKG not performed today  ASSESSMENT AND PLAN:   Essential hypertension History of essential hypertension her blood  pressure measured today 122/74.  He is on metoprolol.  Continue current meds at current dosing.  HLD (hyperlipidemia) History of hyperlipidemia on statin therapy with lipid profile from 04/11/2017 revealing total cholesterol of 126, LDL 62 and HDL 31.  Claudication in peripheral vascular disease (HCC) History of peripheral arterial disease status post left iliac stenting by myself 06/06/2015 with right hip claudication and recent intervention by myself 11/07/2017 of his proximal right common iliac artery with a 7 mm x 29 mm long balloon expandable stent.  He has 60% stenosis that was reduced to 0% with.  He had a 40 mm gradient.  His post procedure Dopplers were normal and his claudication has resolved.  He remains on dual antiplatelet therapy.      Runell GessJonathan J.  MD FACP,FACC,FAHA, [facility] 12/07/2017 9:44 AM

## 2017-12-07 NOTE — Assessment & Plan Note (Signed)
History of hyperlipidemia on statin therapy with lipid profile from 04/11/2017 revealing total cholesterol of 126, LDL 62 and HDL 31.

## 2017-12-07 NOTE — Assessment & Plan Note (Signed)
History of peripheral arterial disease status post left iliac stenting by myself 06/06/2015 with right hip claudication and recent intervention by myself 11/07/2017 of his proximal right common iliac artery with a 7 mm x 29 mm long balloon expandable stent.  He has 60% stenosis that was reduced to 0% with.  He had a 40 mm gradient.  His post procedure Dopplers were normal and his claudication has resolved.  He remains on dual antiplatelet therapy.

## 2017-12-07 NOTE — Patient Instructions (Signed)
Medication Instructions:  Your physician recommends that you continue on your current medications as directed. Please refer to the Current Medication list given to you today.  If you need a refill on your cardiac medications before your next appointment, please call your pharmacy.   Lab work: none If you have labs (blood work) drawn today and your tests are completely normal, you will receive your results only by: [name]. MyChart Message (if you have MyChart) OR . A paper copy in the mail If you have any lab test that is abnormal or we need to change your treatment, we will call you to review the results.  Testing/Procedures: Your physician has requested that you have an aorta/iliac duplex. During this test, an ultrasound is used to evaluate blood flow to the aorta and iliac arteries. Allow one hour for this exam. Do not eat after midnight the day before and avoid carbonated beverages.   Your physician has requested that you have an ankle brachial index (ABI). During this test an ultrasound and blood pressure cuff are used to evaluate the arteries that supply the arms and legs with blood. Allow thirty minutes for this exam. There are no restrictions or special instructions.  SCHEDULE IN MAY 2020   Follow-Up: At [facility], you and your health needs are our priority.  As part of our continuing mission to provide you with exceptional heart care, we have created designated Provider Care Teams.  These Care Teams include your primary Cardiologist (physician) and Advanced Practice Providers (APPs -  Physician Assistants and Nurse Practitioners) who all work together to provide you with the care you need, when you need it. You will need a follow up appointment in 12 months.  Please call our office 2 months in advance to schedule this appointment.  You may see Nanetta BattyJonathan Berry, MD or one of the following Advanced Practice Providers on your designated Care Team:   Corine ShelterLuke Kilroy, PA-C Judy PimpleKrista Kroeger,  [place] JerseyPA-C . Marjie Skiffallie Goodrich, PA-C  Any Other Special Instructions Will Be Listed Below (If Applicable).

## 2017-12-07 NOTE — Assessment & Plan Note (Signed)
History of essential hypertension her blood pressure measured today 122/74.  He is on metoprolol.  Continue current meds at current dosing.

## 2017-12-20 ENCOUNTER — Other Ambulatory Visit: Payer: Self-pay

## 2018-01-19 ENCOUNTER — Other Ambulatory Visit: Payer: Self-pay | Admitting: Cardiovascular Disease

## 2018-02-15 ENCOUNTER — Encounter: Payer: Self-pay | Admitting: Internal Medicine

## 2018-02-16 ENCOUNTER — Encounter: Payer: Self-pay | Admitting: Internal Medicine

## 2018-02-16 ENCOUNTER — Ambulatory Visit (INDEPENDENT_AMBULATORY_CARE_PROVIDER_SITE_OTHER): Payer: Self-pay | Admitting: Internal Medicine

## 2018-02-16 VITALS — BP 126/78 | HR 63 | Temp 97.9°F | Wt 188.0 lb

## 2018-02-16 DIAGNOSIS — J329 Chronic sinusitis, unspecified: Secondary | ICD-10-CM

## 2018-02-16 DIAGNOSIS — B9789 Other viral agents as the cause of diseases classified elsewhere: Secondary | ICD-10-CM

## 2018-02-16 MED ORDER — PREDNISONE 10 MG PO TABS: ORAL_TABLET | ORAL | 0 refills | Status: DC
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
## 2018-02-16 NOTE — Patient Instructions (Signed)

## 2018-04-09 ENCOUNTER — Encounter: Payer: Self-pay | Admitting: Internal Medicine

## 2018-06-07 ENCOUNTER — Encounter (HOSPITAL_COMMUNITY): Payer: Medicaid Other

## 2018-08-02 ENCOUNTER — Encounter: Payer: Self-pay | Admitting: Internal Medicine

## 2018-08-03 ENCOUNTER — Telehealth: Payer: Self-pay

## 2018-08-03 NOTE — Telephone Encounter (Signed)
[place] Night - Client Nonclinical Telephone Record AccessNurse Client [facility] Night - Client Client Site [place] Physician Webb Silversmith - NP Contact Type Call Who Is Calling Patient / Member / Family / Caregiver Caller Name [place] Phone Number [phone] Patient Name [name] [name] Patient DOB 26-Jan-1970 Call Type Message Only Information Provided Reason for Call Request for General Office Information Initial Comment Caller is having issure c Mychart, will like to know if office accepts adult medicaid, sent message through mychart has not received a reply. Will like to know if the system is working Additional Comment caller states continues to speak on uncertainty of office being open, and unsure about pt needing to go to ER for a "simple fix" will like to know if MD Garnette Gunner accepts adult medicaid so he does not have to go to the emergency room and be able to return to work. Call Closed By: Luiz Blare Transaction Date/Time: 08/02/2018 5:37:42 PM (ET)

## 2018-08-04 NOTE — Telephone Encounter (Signed)
Noted.  If he continues to be disrespectful to staff, he will be dismissed from the practice.

## 2018-08-04 NOTE — Telephone Encounter (Signed)
Patient called office today to schedule an appointment,  Patient stated he needs a follow up/ When scheduling I asked the screening questions and patient stated he has low grade fever and chills/muscle aches. Advised him that with any of the symptoms listed we are unable to bring patient's in. Patient became very upset and aggravated. He stated he would not being doing a virtual visit because he is a face to face person. Patient stated he also would not be paying for a visit like that and did not understand how she could test a blood sugar over the phone.    I tried to explain to the patient that this is just the policy with everything going on with covid and could schedule him for a couple weeks out. But patient hung up on me before I could advise him of this.   FYI

## 2018-08-09 ENCOUNTER — Other Ambulatory Visit (HOSPITAL_COMMUNITY): Payer: Self-pay | Admitting: Cardiovascular Disease

## 2018-08-09 ENCOUNTER — Ambulatory Visit (HOSPITAL_COMMUNITY)
Admission: RE | Admit: 2018-08-09 | Discharge: 2018-08-09 | Disposition: A | Discharge status: Home / Self Care | Payer: Self-pay | Source: Ambulatory Visit | Attending: Cardiovascular Disease | Admitting: Cardiovascular Disease

## 2018-08-09 ENCOUNTER — Other Ambulatory Visit: Payer: Self-pay

## 2018-08-09 DIAGNOSIS — I739 Peripheral vascular disease, unspecified: Secondary | ICD-10-CM | POA: Insufficient documentation

## 2018-08-09 DIAGNOSIS — I10 Essential (primary) hypertension: Secondary | ICD-10-CM | POA: Insufficient documentation

## 2018-08-09 DIAGNOSIS — E7800 Pure hypercholesterolemia, unspecified: Secondary | ICD-10-CM | POA: Insufficient documentation

## 2018-08-09 DIAGNOSIS — Z95828 Presence of other vascular implants and grafts: Secondary | ICD-10-CM

## 2018-11-02 ENCOUNTER — Telehealth: Payer: Self-pay | Admitting: *Deleted

## 2018-11-02 NOTE — Telephone Encounter (Signed)
A message was left, re: his follow up visit. 

## 2018-12-29 NOTE — Telephone Encounter (Signed)
Spoke with pt and has not been able to afford Metoprolol or Plavix the patient has not taken for over 2 months Pt complaining with dizziness , fatigue , and severe leg pain after walking only 100 feet. Pt is trying to get disability  Pt is over due for appt. Appt made for 01/04/19 at 8:45 am  Will forward message to Dr Gwenlyn Found for review./cy

## 2018-12-30 ENCOUNTER — Encounter (HOSPITAL_COMMUNITY): Payer: Self-pay | Admitting: Emergency Medicine

## 2018-12-30 ENCOUNTER — Telehealth: Payer: Self-pay | Admitting: Cardiovascular Disease

## 2018-12-30 ENCOUNTER — Emergency Department (HOSPITAL_COMMUNITY)
Admission: EM | Admit: 2018-12-30 | Discharge: 2018-12-31 | Disposition: A | Discharge status: Home / Self Care | Payer: Medicaid Other | Attending: Emergency Medicine | Admitting: Emergency Medicine

## 2018-12-30 ENCOUNTER — Other Ambulatory Visit: Payer: Self-pay

## 2018-12-30 ENCOUNTER — Telehealth: Payer: Self-pay

## 2018-12-30 ENCOUNTER — Emergency Department (HOSPITAL_COMMUNITY): Payer: Medicaid Other

## 2018-12-30 DIAGNOSIS — M79651 Pain in right thigh: Secondary | ICD-10-CM | POA: Insufficient documentation

## 2018-12-30 DIAGNOSIS — Z5321 Procedure and treatment not carried out due to patient leaving prior to being seen by health care provider: Secondary | ICD-10-CM | POA: Insufficient documentation

## 2018-12-30 DIAGNOSIS — R1031 Right lower quadrant pain: Secondary | ICD-10-CM

## 2018-12-30 LAB — BASIC METABOLIC PANEL
Anion gap: 9 (ref 5–15)
BUN: 7 mg/dL (ref 6–20)
CO2: 26 mmol/L (ref 22–32)
Calcium: 9.1 mg/dL (ref 8.9–10.3)
Chloride: 106 mmol/L (ref 98–111)
Creatinine, Ser: 1.2 mg/dL (ref 0.61–1.24)
GFR calc Af Amer: >60 mL/min (ref >60)
GFR calc non Af Amer: >60 mL/min (ref >60)
Glucose, Bld: 115 mg/dL — ABNORMAL HIGH (ref 70–99)
Potassium: 5.2 mmol/L — ABNORMAL HIGH (ref 3.5–5.1)
Sodium: 141 mmol/L (ref 135–145)

## 2018-12-30 LAB — CBC
HCT: 48.9 % (ref 39.0–52.0)
Hemoglobin: 16.4 g/dL (ref 13.0–17.0)
MCH: 29.4 pg (ref 26.0–34.0)
MCHC: 33.5 g/dL (ref 30.0–36.0)
MCV: 87.6 fL (ref 80.0–100.0)
Platelets: 263 10*3/uL (ref 150–400)
RBC: 5.58 MIL/uL (ref 4.22–5.81)
RDW: 14.3 % (ref 11.5–15.5)
WBC: 11.9 10*3/uL — ABNORMAL HIGH (ref 4.0–10.5)
nRBC: 0 % (ref 0.0–0.2)

## 2018-12-30 NOTE — Telephone Encounter (Signed)
I haven't seen him in the office for over a year . Happy to see him back in follow up

## 2018-12-30 NOTE — ED Notes (Signed)
Pt stated he is waiting too long and says he is leaving

## 2018-12-30 NOTE — Telephone Encounter (Signed)
New Message:    Please call, questions about his stent hat he have in.

## 2018-12-30 NOTE — ED Triage Notes (Signed)
Patient c/o right groin/ upper thigh pain, redness and swelling onset of yesterday.

## 2018-12-30 NOTE — Telephone Encounter (Signed)
Called pt regarding moving virtual appt from 8:45am on 12/16 to 9:15am d/t Dr Kennon Holter schedule. Explained to pt that it was virtual. Pt did not know that it was virtual when it was scheduled on a virtual day. Pt began to explain that he "feels like his stents are moving" and he needs to see someone or he was going to [employer]. Asked pt if the surgical site was red and warm and if he was in pain. 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 Pt then asked how we are going to be able to look at him and take his BP through a virtual visit. Advised pt that if he has a BP machine that we ask him to take it and if he has a smart device it could be a video visit. Pt stated again, "he was not a medical professional". Advised pt to go to hospital if he was in that much pain and was wanting to be seen sooner. Offered to change pt appointment to in-person for Friday 12/18. Pt stated "I think everyone is just trying to kill me" and then hung up the phone. Will route to Dr Gwenlyn Found for review.

## 2018-12-30 NOTE — Telephone Encounter (Signed)
Returned call to pt he states that he has a "bubble" in his groin it is warm to the touch and painful to the touch, there are no opening for appt today. Informed pt that this needs to be evaluated. He should go to the urgent care/ER. He states that he does not have insurance and has been talking to his PCP nd states that he will call them and see if he can see him today to evaluate. He will CB if anything else is needed verbalizes understanding.

## 2018-12-30 NOTE — Telephone Encounter (Signed)
Reviewed pt's records and disability forms received and records sent as requested Pt aware needs to contact Disability department to find out where he is located in the process./cy

## 2019-01-02 NOTE — Telephone Encounter (Signed)
Pt went to ED last week and had pelvic ultrasound wanted to let you know Pt unable to make appt at this time transportation issues Will forward to Dr Gwenlyn Found to let know about recent ED visit ./cy

## 2019-01-02 NOTE — Telephone Encounter (Signed)
Thx for letting me know  JJB 

## 2019-01-04 ENCOUNTER — Telehealth: Payer: Medicaid Other | Admitting: Cardiovascular Disease

## 2019-04-06 ENCOUNTER — Encounter: Payer: Self-pay | Admitting: General Practice
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
## 2019-04-28 ENCOUNTER — Ambulatory Visit (INDEPENDENT_AMBULATORY_CARE_PROVIDER_SITE_OTHER): Payer: Self-pay | Admitting: Student

## 2019-04-28 ENCOUNTER — Encounter: Payer: Self-pay | Admitting: Student

## 2019-04-28 ENCOUNTER — Other Ambulatory Visit: Payer: Self-pay

## 2019-04-28 VITALS — BP 124/82 | HR 103 | Temp 97.1°F | Ht 69.0 in | Wt 197.8 lb

## 2019-04-28 DIAGNOSIS — R42 Dizziness and giddiness: Secondary | ICD-10-CM

## 2019-04-28 DIAGNOSIS — I2510 Atherosclerotic heart disease of native coronary artery without angina pectoris: Secondary | ICD-10-CM

## 2019-04-28 DIAGNOSIS — Z951 Presence of aortocoronary bypass graft: Secondary | ICD-10-CM

## 2019-04-28 DIAGNOSIS — R0609 Other forms of dyspnea: Secondary | ICD-10-CM

## 2019-04-28 DIAGNOSIS — I6523 Occlusion and stenosis of bilateral carotid arteries: Secondary | ICD-10-CM

## 2019-04-28 DIAGNOSIS — E785 Hyperlipidemia, unspecified: Secondary | ICD-10-CM

## 2019-04-28 DIAGNOSIS — I739 Peripheral vascular disease, unspecified: Secondary | ICD-10-CM

## 2019-04-28 DIAGNOSIS — I10 Essential (primary) hypertension: Secondary | ICD-10-CM

## 2019-04-28 DIAGNOSIS — R0600 Dyspnea, unspecified: Secondary | ICD-10-CM

## 2019-04-28 DIAGNOSIS — R1031 Right lower quadrant pain: Secondary | ICD-10-CM

## 2019-04-28 MED ORDER — CLOPIDOGREL BISULFATE 75 MG PO TABS: 75.0000 mg | ORAL_TABLET | Freq: Every day | ORAL | 10 refills | Status: DC

## 2019-04-28 MED ORDER — ESOMEPRAZOLE MAGNESIUM 20 MG PO CPDR: 20.0000 mg | DELAYED_RELEASE_CAPSULE | Freq: Every day | ORAL | 6 refills | Status: DC

## 2019-04-28 MED ORDER — METOPROLOL TARTRATE 25 MG PO TABS: 25.0000 mg | ORAL_TABLET | Freq: Two times a day (BID) | ORAL | 3 refills | Status: DC

## 2019-04-28 MED ORDER — ATORVASTATIN CALCIUM 80 MG PO TABS: 80.0000 mg | ORAL_TABLET | Freq: Every day | ORAL | 3 refills | Status: DC

## 2019-04-28 NOTE — Patient Instructions (Signed)
Medication Instructions:  Your physician recommends that you continue on your current medications as directed. Please refer to the Current Medication list given to you today.  *If you need a refill on your cardiac medications before your next appointment, please call your pharmacy*   Lab Work: Your physician recommends that you return for lab work today: BMET, CBC  If you have labs (blood work) drawn today and your tests are completely normal, you will receive your results only by: [name] MyChart Message (if you have MyChart) OR . A paper copy in the mail If you have any lab test that is abnormal or we need to change your treatment, we will call you to review the results.   Testing/Procedures: Your physician has requested that you have a carotid duplex. This test is an ultrasound of the carotid arteries in your neck. It looks at blood flow through these arteries that supply the brain with blood. Allow one hour for this exam. There are no restrictions or special instructions.  Your physician has requested that you have a lower extremity arterial duplex. During this test, ultrasound is used to evaluate arterial blood flow in the legs. Allow one hour for this exam. There are no restrictions or special instructions.  Your physician has requested that you have an ankle brachial index (ABI). During this test an ultrasound and blood pressure cuff are used to evaluate the arteries that supply the arms and legs with blood. Allow thirty minutes for this exam. There are no restrictions or special instructions.   Follow-Up: At [facility], you and your health needs are our priority.  As part of our continuing mission to provide you with exceptional heart care, we have created designated Provider Care Teams.  These Care Teams include your primary Cardiologist (physician) and Advanced Practice Providers (APPs -  Physician Assistants and Nurse Practitioners) who all work together to provide you with the care  you need, when you need it.  We recommend signing up for the patient portal called "MyChart".  Sign up information is provided on this After Visit Summary.  MyChart is used to connect with patients for Virtual Visits (Telemedicine).  Patients are able to view lab/test results, encounter notes, upcoming appointments, etc.  Non-urgent messages can be sent to your provider as well.   To learn more about what you can do with MyChart, go to [email or web address].    Your next appointment:   1 month(s)  The format for your next appointment:   In Person  Provider:   Nanetta Batty, MD   Other Instructions  Stop smoking  Please call our office if you start experiencing worsening dizziness or shortness of breath.  Our social worker, Lasandra Beech will be contacting you with resources for care.

## 2019-04-29 LAB — CBC
Hematocrit: 46.8 % (ref 37.5–51.0)
Hemoglobin: 16 g/dL (ref 13.0–17.7)
MCH: 28.7 pg (ref 26.6–33.0)
MCHC: 34.2 g/dL (ref 31.5–35.7)
MCV: 84 fL (ref 79–97)
Platelets: 234 10*3/uL (ref 150–450)
RBC: 5.57 x10E6/uL (ref 4.14–5.80)
RDW: 14.5 % (ref 11.6–15.4)
WBC: 17.9 10*3/uL — ABNORMAL HIGH (ref 3.4–10.8)

## 2019-04-29 LAB — BASIC METABOLIC PANEL
BUN/Creatinine Ratio: 6 — ABNORMAL LOW (ref 9–20)
BUN: 7 mg/dL (ref 6–24)
CO2: 22 mmol/L (ref 20–29)
Calcium: 9.6 mg/dL (ref 8.7–10.2)
Chloride: 102 mmol/L (ref 96–106)
Creatinine, Ser: 1.18 mg/dL (ref 0.76–1.27)
GFR calc Af Amer: 84 mL/min/{1.73_m2} (ref >59)
GFR calc non Af Amer: 73 mL/min/{1.73_m2} (ref >59)
Glucose: 83 mg/dL (ref 65–99)
Potassium: 4.7 mmol/L (ref 3.5–5.2)
Sodium: 140 mmol/L (ref 134–144)

## 2019-05-01 ENCOUNTER — Telehealth: Payer: Self-pay | Admitting: Licensed Clinical Social Worker

## 2019-05-01 NOTE — Telephone Encounter (Signed)
CSW returned call to patient to inform of PCP appointment on 05-29-19 @ 10:30am at the [facility]. CSW will also inform patient to follow up with Financial Counseling prior to appointment. CSW left message for return call. Lasandra Beech, LCSW, CCSW-MCS [phone]

## 2019-05-01 NOTE — Telephone Encounter (Signed)
Completed Navigation Summary

## 2019-05-01 NOTE — Progress Notes (Signed)
  Heart and Vascular Care Navigation  05/01/2019  RAVINDER LUKEHART 11-02-1970 [id number]  Reason for Referral: Patient referred to assist with financial needs, lack of insurance and PCP follow up.  Assessment: Patient lives with his fiance in a single family home. He is currently out of work due to ongoing medical issues. Patient reports he was denied disability last week and has started the appeal process and has a Clinical research associate for assistance. He has food stamps and recently used all of his stimulus payment to pay off his household bills. Patient is uninsured and struggles to pay medical bills and obtain his medications.   HRT/VAS Care Coordination    Home Assistive Devices/Equipment  Eyeglasses      Social History: SDOH Screenings   Alcohol Screen:   . Last Alcohol Screening Score (AUDIT):   Depression (PHQ2-9):   . PHQ-2 Score:   Financial Resource Strain: High Risk  . Difficulty of Paying Living Expenses: Very hard  Food Insecurity: No Food Insecurity  . Worried About Programme researcher, broadcasting/film/video in the Last Year: Never true  . Ran Out of Food in the Last Year: Never true  Housing: High Risk  . Last Housing Risk Score: 2  Physical Activity:   . Days of Exercise per Week:   . Minutes of Exercise per Session:   Social Connections:   . Frequency of Communication with Friends and Family:   . Frequency of Social Gatherings with Friends and Family:   . Attends Religious Services:   . Active Member of Clubs or Organizations:   . Attends Banker Meetings:   [name] Marital Status:   Stress:   . Feeling of Stress :   Tobacco Use: Medium Risk  . Smoking Tobacco Use: Former Smoker  . Smokeless Tobacco Use: Former Dispensing optician Needs: No Transportation Needs  . Lack of Transportation (Medical): No  . Lack of Transportation (Non-Medical): No    SDOH Interventions: Financial Resources:  Corporate treasurer Interventions: Artist, Other (Comment)(CSW will assist wiht  Patient Care Fund as needed.)   Food Insecurity:   Patient states he has food stamps and make it work until the end of the month.  Housing Insecurity:  Housing Interventions: Other (Comment)(CSW will assist wiht patient Care Fund as needed.)  Transportation:    Pt states he shares a car with his fiancee.    Other Care Navigation Interventions:   Inpatient/Outpatient Substance Abuse Counseling/Rehab Options n/a  Provided Pharmacy assistance resources  CSW will refer to Financial Counseling at [facility]   Follow-up plan: CSW will refer patient to [facility] for PCP and Financial Counselor for continued Primary Care needs. CSW will return call to patient once appointment for PCP is available. CSW will provide supportive intervention around additional financial needs and disability follow up.  Lasandra Beech, LCSW, CCSW-MCS [phone]

## 2019-05-01 NOTE — Telephone Encounter (Signed)
CSW received return call from patient and was provided appointment time for PCP appointment. CSW and patient also discussed [facility] Disc[facility] Plan and encouraged patient to follow up with Financial Counseling for further advice on [facility] discount vs possible medicaid. Patient will follow up and return call to CSW as needed. Lasandra Beech, LCSW, CCSW-MCS [phone]

## 2019-05-05 ENCOUNTER — Ambulatory Visit (HOSPITAL_COMMUNITY)
Admission: RE | Admit: 2019-05-05 | Discharge: 2019-05-05 | Disposition: A | Discharge status: Home / Self Care | Payer: Self-pay | Source: Ambulatory Visit | Attending: Cardiovascular Disease | Admitting: Cardiovascular Disease

## 2019-05-05 ENCOUNTER — Other Ambulatory Visit (HOSPITAL_COMMUNITY): Payer: Self-pay | Admitting: Cardiovascular Disease

## 2019-05-05 ENCOUNTER — Other Ambulatory Visit: Payer: Self-pay

## 2019-05-05 DIAGNOSIS — I6523 Occlusion and stenosis of bilateral carotid arteries: Secondary | ICD-10-CM

## 2019-05-12 ENCOUNTER — Other Ambulatory Visit: Payer: Self-pay

## 2019-05-12 ENCOUNTER — Ambulatory Visit (HOSPITAL_COMMUNITY)
Admission: RE | Admit: 2019-05-12 | Discharge: 2019-05-12 | Disposition: A | Discharge status: Home / Self Care | Payer: Medicaid Other | Source: Ambulatory Visit | Attending: Cardiovascular Disease | Admitting: Cardiovascular Disease

## 2019-05-12 ENCOUNTER — Ambulatory Visit (HOSPITAL_COMMUNITY)
Admission: RE | Admit: 2019-05-12 | Discharge: 2019-05-12 | Disposition: A | Discharge status: Home / Self Care | Payer: Self-pay | Source: Ambulatory Visit | Attending: Cardiovascular Disease | Admitting: Cardiovascular Disease

## 2019-05-12 ENCOUNTER — Other Ambulatory Visit: Payer: Self-pay | Admitting: Student

## 2019-05-12 DIAGNOSIS — I739 Peripheral vascular disease, unspecified: Secondary | ICD-10-CM

## 2019-05-12 DIAGNOSIS — Z95828 Presence of other vascular implants and grafts: Secondary | ICD-10-CM | POA: Insufficient documentation

## 2019-05-17 ENCOUNTER — Ambulatory Visit (HOSPITAL_COMMUNITY): Payer: Self-pay | Attending: Cardiovascular Disease

## 2019-05-17 ENCOUNTER — Other Ambulatory Visit: Payer: Self-pay

## 2019-05-17 DIAGNOSIS — R0609 Other forms of dyspnea: Secondary | ICD-10-CM

## 2019-05-17 DIAGNOSIS — I2510 Atherosclerotic heart disease of native coronary artery without angina pectoris: Secondary | ICD-10-CM | POA: Insufficient documentation

## 2019-05-17 DIAGNOSIS — R0600 Dyspnea, unspecified: Secondary | ICD-10-CM | POA: Insufficient documentation

## 2019-05-19 ENCOUNTER — Other Ambulatory Visit: Payer: Self-pay

## 2019-05-19 ENCOUNTER — Ambulatory Visit (INDEPENDENT_AMBULATORY_CARE_PROVIDER_SITE_OTHER): Payer: Self-pay | Admitting: Cardiovascular Disease

## 2019-05-19 ENCOUNTER — Encounter: Payer: Self-pay | Admitting: Cardiovascular Disease

## 2019-05-19 DIAGNOSIS — F17200 Nicotine dependence, unspecified, uncomplicated: Secondary | ICD-10-CM

## 2019-05-19 DIAGNOSIS — I739 Peripheral vascular disease, unspecified: Secondary | ICD-10-CM

## 2019-05-19 DIAGNOSIS — I10 Essential (primary) hypertension: Secondary | ICD-10-CM

## 2019-05-19 DIAGNOSIS — Z951 Presence of aortocoronary bypass graft: Secondary | ICD-10-CM

## 2019-05-19 DIAGNOSIS — E782 Mixed hyperlipidemia: Secondary | ICD-10-CM

## 2019-05-19 MED ORDER — SODIUM CHLORIDE 0.9% FLUSH: 3.0000 mL | Freq: Two times a day (BID) | INTRAVENOUS | Status: DC

## 2019-05-19 NOTE — H&P (View-Only) (Signed)
05/19/2019 [name] [name]   May 24, 1970  [id number]  Primary Physician Lorre Munroe, NP Primary Cardiologist: [name] [name]  HPI:  [name] [name] is a 49 y.o.    father of one 61 year old son who is referred by [name] registered ,nurse practitioner, for peripheral vascular evaluation. I last saw him in the 12/07/2017. He has a history of acute tobacco abuse having smoked 40-60 pack years, treated hypertension and hyperlipidemia. She has never had a heart attack or stroke and denies chest pain or shortness of breath. He saw [name] years ago and ultimately underwent angiography/ 05/16/13 revealing bilateral iliac disease both of which were stented with balloon expandable and self expanding stents. He enjoyed symptomatic relief for approximately one year and then developed recurrent left calf claudication. Recent Dopplers performed 05/02/15 revealed a left ABI 0.78 with a high-frequency signal in his left external iliac artery. I performed lower extremity angiography on him 06/06/15 revealing a patent left common and external iliac artery stent with a 95% stenosis at the distal edge of the proximal stent which I restented with a 9 mm x 3 cm nitinol self expanding stent. His symptoms of claudication have resolved and his Dopplers have normalized. He has in addition stop smoking by his account. He had a left main stent placed in [name] August of last year and underwent repeat cardiac catheterization by Dr. 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 He underwent coronary artery bypass grafting x3 by Dr. [name] 04/07/2017 with a [name] to the LAD and vein to [name] and 2. He is done well since. He has stopped smoking.  I performed peripheral angiography and intervention on him 11/07/2017 with stenting of his right common iliac artery with a 7 mm x 29 mm long balloon expandable stent.  The previous stent I placed in  his left leg were widely patent angiographically.  His follow-up Doppler studies normalized and his claudication has resolved.  Since I saw him a year and a half ago he has developed progressive right lower extremity claudication with Doppler studies performed 05/12/2019 revealing a decline in his right ABI to 0.7 with high-frequency signals in his right common and external iliac artery suggesting progression of disease and/or in-stent restenosis.  He does continue to smoke.  Unfortunately, he was out of all of his medicines for 8 months up until recently when he restarted those.  He did have a 2D echo performed 05/09/2019 revealing slightly lower EF than in the past in the 45 to 50% range with an inferoapical Dosch motion abnormality.   Current Meds  Medication Sig  . aspirin EC 81 MG tablet Take 1 tablet (81 mg total) by mouth daily.  [name] atorvastatin (LIPITOR) 80 MG tablet Take 1 tablet (80 mg total) by mouth at bedtime.  . clopidogrel (PLAVIX) 75 MG tablet Take 1 tablet (75 mg total) by mouth daily.  . metoprolol tartrate (LOPRESSOR) 25 MG tablet Take 1 tablet (25 mg total) by mouth 2 (two) times daily.  . Triamcinolone Acetonide (NASACORT ALLERGY 24HR NA) Place 1 spray into the nose daily as needed (allergies).     Allergies  Allergen Reactions  . Codeine Other (See Comments)    Migraine     Social History   Socioeconomic History  . Marital status: Divorced    Spouse name: Not on file  . Number of children: Not on file  . Years of  education: Not on file  . Highest education level: Not on file  Occupational History  . Occupation: Truck Geophysicist/field seismologist  Tobacco Use  . Smoking status: Former Smoker    Packs/day: 1.00    Years: 30.00    Pack years: 30.00    Types: Cigarettes    Quit date: 06/07/2015    Years since quitting: 3.9  . Smokeless tobacco: Former Systems developer    Types: Snuff  . Tobacco comment: "used snuff in my teens"  Substance and Sexual Activity  . Alcohol use: Yes     Alcohol/week: 0.0 standard drinks    Comment: 06/06/2015 "might have 1 beer/month"  . Drug use: No  . Sexual activity: Not Currently  Other Topics Concern  . Not on file  Social History Narrative  . Not on file   Social Determinants of Health   Financial Resource Strain: High Risk  . Difficulty of Paying Living Expenses: Very hard  Food Insecurity: No Food Insecurity  . Worried About Charity fundraiser in the Last Year: Never true  . Ran Out of Food in the Last Year: Never true  Transportation Needs: No Transportation Needs  . Lack of Transportation (Medical): No  . Lack of Transportation (Non-Medical): No  Physical Activity:   . Days of Exercise per Week:   . Minutes of Exercise per Session:   Stress:   . Feeling of Stress :   Social Connections:   . Frequency of Communication with Friends and Family:   . Frequency of Social Gatherings with Friends and Family:   . Attends Religious Services:   . Active Member of Clubs or Organizations:   . Attends Archivist Meetings:   [name] Marital Status:   Intimate Partner Violence:   . Fear of Current or Ex-Partner:   . Emotionally Abused:   [name] Physically Abused:   . Sexually Abused:      Review of Systems: General: negative for chills, fever, night sweats or weight changes.  Cardiovascular: negative for chest pain, dyspnea on exertion, edema, orthopnea, palpitations, paroxysmal nocturnal dyspnea or shortness of breath Dermatological: negative for rash Respiratory: negative for cough or wheezing Urologic: negative for hematuria Abdominal: negative for nausea, vomiting, diarrhea, bright red blood per rectum, melena, or hematemesis Neurologic: negative for visual changes, syncope, or dizziness All other systems reviewed and are otherwise negative except as noted above.    Blood pressure 138/84, pulse (!) 58, height 5\' 9"  (1.753 m), weight 202 lb (91.6 kg), SpO2 97 %.  General appearance: alert and no distress Neck: no  adenopathy, no JVD, supple, symmetrical, trachea midline, thyroid not enlarged, symmetric, no tenderness/mass/nodules and Soft bilateral carotid bruits Lungs: clear to auscultation bilaterally Heart: regular rate and rhythm, S1, S2 normal, no murmur, click, rub or gallop Extremities: extremities normal, atraumatic, no cyanosis or edema Pulses: 2+ and symmetric Skin: Skin color, texture, turgor normal. No rashes or lesions Neurologic: Alert and oriented X 3, normal strength and tone. Normal symmetric reflexes. Normal coordination and gait  EKG not performed today  ASSESSMENT AND PLAN:   Claudication in peripheral vascular disease ([place]) History of PAD status post bilateral iliac stenting most recently by myself 11/15/2017 of the origin/proximal right common iliac artery.  His left iliac stent was patent.  He did have a 50% right external neck artery stenosis without a gradient.  Since that time he has had progressive lifestyle limiting claudication with Dopplers performed 05/12/2019 revealing a right ABI of 0.7 and a left of 0.96.  He did have high-frequency signals in his right common and external neck artery.  I suspect he had progression of disease.  He will need angiography and reintervention.  HLD (hyperlipidemia) History of hyperlipidemia on high-dose statin therapy in the past although he is not been on his medicines for the last 8 months because of financial considerations.  He is back on his medicines now.  We will recheck a lipid and liver profile.  Smoking Continue tobacco abuse with the intent to stop  Essential hypertension History of essential hypertension with blood pressure measured today 138/84.  He is on Lopressor.  S/P CABG x 3 History of CAD status post remote left main stenting in [facility] August 2018 with subsequent admission for unstable angina 04/09/2017 cardiac catheterization performed by Dr. [name] revealing 80% "in-stent restenosis.  He underwent CABG x3 by Dr.  [name] 04/07/2017 with a [name] to his LAD, sequential veins to [name] and 2.  He currently denies chest pain.      [name] FACP,FACC,FAHA, [facility] 05/19/2019 12:16 PM

## 2019-05-19 NOTE — Assessment & Plan Note (Signed)
History of hyperlipidemia on high-dose statin therapy in the past although he is not been on his medicines for the last 8 months because of financial considerations.  He is back on his medicines now.  We will recheck a lipid and liver profile.

## 2019-05-19 NOTE — Patient Instructions (Addendum)
Medication Instructions:  The current medical regimen is effective;  continue present plan and medications.  *If you need a refill on your cardiac medications before your next appointment, please call your pharmacy*  Labs: Lipid and Liver, CBC, BMET - come back fasting, no appointment needed for our office lab.  COVID TEST- 05/05- at 11:50 [facility]   Testing/Procedures: LE ANGIOGRAM   Your physician has requested that you have a carotid duplex (in one year). This test is an ultrasound of the carotid arteries in your neck. It looks at blood flow through these arteries that supply the brain with blood. Allow one hour for this exam. There are no restrictions or special instructions.  Your physician has requested that you have an ankle brachial index (ABI)(one week after procedure)  During this test an ultrasound and blood pressure cuff are used to evaluate the arteries that supply the arms and legs with blood. Allow thirty minutes for this exam. There are no restrictions or special instructions.  Your physician has requested that you have a lower or upper extremity arterial duplex (one week after procedure) . This test is an ultrasound of the arteries in the legs or arms. It looks at arterial blood flow in the legs and arms. Allow one hour for Lower and Upper Arterial scans. There are no restrictions or special instructions   Follow-Up: At [facility], you and your health needs are our priority.  As part of our continuing mission to provide you with exceptional heart care, we have created designated Provider Care Teams.  These Care Teams include your primary Cardiologist (physician) and Advanced Practice Providers (APPs -  Physician Assistants and Nurse Practitioners) who all work together to provide you with the care you need, when you need it.  We recommend signing up for the patient portal called "MyChart".  Sign up information is provided on this After Visit Summary.  MyChart is  used to connect with patients for Virtual Visits (Telemedicine).  Patients are able to view lab/test results, encounter notes, upcoming appointments, etc.  Non-urgent messages can be sent to your provider as well.   To learn more about what you can do with MyChart, go to [email or web address].    Your next appointment:   2 week(s)  The format for your next appointment:   In Person  Provider:   Nanetta Batty, MD   Other Instructions     [facility] CARDIOVASCULAR DIVISION [facility] [address] [place] [address] [place] [address] Dept: [phone] Loc: [phone]  Jaymes Hang Nasser  05/19/2019  You are scheduled for a Peripheral Angiogram on Monday, May 10 with Dr. Nanetta Batty.  1. Please arrive at the [facility] (Main Entrance A) at [facility]: [address] [place], [place] [address] at 11:30 AM (This time is two hours before your procedure to ensure your preparation). Free valet parking service is available.   Special note: Every effort is made to have your procedure done on time. Please understand that emergencies sometimes delay scheduled procedures.  2. Diet: Do not eat solid foods after midnight.  The patient may have clear liquids until 5am upon the day of the procedure.  3. Labs: You will not need to have blood drawn - recent blood work will work.  4. Medication instructions in preparation for your procedure:   Contrast Allergy: No   On the morning of your procedure, take your Plavix/Clopidogrel and Aspirin and any morning medicines NOT listed above.  You may use sips of water.  5. Plan for one night stay--bring personal belongings. 6. Bring a current list of your medications and current insurance cards. 7. You MUST have a responsible person to drive you home. 8. Someone MUST be with you the first 24 hours after you arrive home or your discharge will be delayed. 9. Please wear clothes that are easy to get  on and off and wear slip-on shoes.  Thank you for allowing [place] to care for you!   -- [facility]

## 2019-05-19 NOTE — Assessment & Plan Note (Signed)
History of CAD status post remote left main stenting in [facility] August 2018 with subsequent admission for unstable angina 04/09/2017 cardiac catheterization performed by Dr. [name] revealing 80% "in-stent restenosis.  He underwent CABG x3 by Dr. [name] 04/07/2017 with a [name] to his LAD, sequential veins to [name] and 2.  He currently denies chest pain.

## 2019-05-19 NOTE — Assessment & Plan Note (Signed)
Continue tobacco abuse with the intent to stop

## 2019-05-19 NOTE — Assessment & Plan Note (Signed)
History of essential hypertension with blood pressure measured today 138/84.  He is on Lopressor.

## 2019-05-19 NOTE — Progress Notes (Signed)
05/19/2019 [name] [name]   May 24, 1970  [id number]  Primary Physician Lorre Munroe, NP Primary Cardiologist: [name] [name]  HPI:  [name] [name] is a 49 y.o.    father of one 61 year old son who is referred by [name] registered ,nurse practitioner, for peripheral vascular evaluation. I last saw him in the 12/07/2017. He has a history of acute tobacco abuse having smoked 40-60 pack years, treated hypertension and hyperlipidemia. She has never had a heart attack or stroke and denies chest pain or shortness of breath. He saw [name] years ago and ultimately underwent angiography/ 05/16/13 revealing bilateral iliac disease both of which were stented with balloon expandable and self expanding stents. He enjoyed symptomatic relief for approximately one year and then developed recurrent left calf claudication. Recent Dopplers performed 05/02/15 revealed a left ABI 0.78 with a high-frequency signal in his left external iliac artery. I performed lower extremity angiography on him 06/06/15 revealing a patent left common and external iliac artery stent with a 95% stenosis at the distal edge of the proximal stent which I restented with a 9 mm x 3 cm nitinol self expanding stent. His symptoms of claudication have resolved and his Dopplers have normalized. He has in addition stop smoking by his account. He had a left main stent placed in [name] August of last year and underwent repeat cardiac catheterization by Dr. 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 He underwent coronary artery bypass grafting x3 by Dr. [name] 04/07/2017 with a [name] to the LAD and vein to [name] and 2. He is done well since. He has stopped smoking.  I performed peripheral angiography and intervention on him 11/07/2017 with stenting of his right common iliac artery with a 7 mm x 29 mm long balloon expandable stent.  The previous stent I placed in  his left leg were widely patent angiographically.  His follow-up Doppler studies normalized and his claudication has resolved.  Since I saw him a year and a half ago he has developed progressive right lower extremity claudication with Doppler studies performed 05/12/2019 revealing a decline in his right ABI to 0.7 with high-frequency signals in his right common and external iliac artery suggesting progression of disease and/or in-stent restenosis.  He does continue to smoke.  Unfortunately, he was out of all of his medicines for 8 months up until recently when he restarted those.  He did have a 2D echo performed 05/09/2019 revealing slightly lower EF than in the past in the 45 to 50% range with an inferoapical Dosch motion abnormality.   Current Meds  Medication Sig  . aspirin EC 81 MG tablet Take 1 tablet (81 mg total) by mouth daily.  [name] [name] atorvastatin (LIPITOR) 80 MG tablet Take 1 tablet (80 mg total) by mouth at bedtime.  . clopidogrel (PLAVIX) 75 MG tablet Take 1 tablet (75 mg total) by mouth daily.  . metoprolol tartrate (LOPRESSOR) 25 MG tablet Take 1 tablet (25 mg total) by mouth 2 (two) times daily.  . Triamcinolone Acetonide (NASACORT ALLERGY 24HR NA) Place 1 spray into the nose daily as needed (allergies).     Allergies  Allergen Reactions  . Codeine Other (See Comments)    Migraine     Social History   Socioeconomic History  . Marital status: Divorced    Spouse name: Not on file  . Number of children: Not on file  . Years of  education: Not on file  . Highest education level: Not on file  Occupational History  . Occupation: Truck Geophysicist/field seismologist  Tobacco Use  . Smoking status: Former Smoker    Packs/day: 1.00    Years: 30.00    Pack years: 30.00    Types: Cigarettes    Quit date: 06/07/2015    Years since quitting: 3.9  . Smokeless tobacco: Former Systems developer    Types: Snuff  . Tobacco comment: "used snuff in my teens"  Substance and Sexual Activity  . Alcohol use: Yes     Alcohol/week: 0.0 standard drinks    Comment: 06/06/2015 "might have 1 beer/month"  . Drug use: No  . Sexual activity: Not Currently  Other Topics Concern  . Not on file  Social History Narrative  . Not on file   Social Determinants of Health   Financial Resource Strain: High Risk  . Difficulty of Paying Living Expenses: Very hard  Food Insecurity: No Food Insecurity  . Worried About Charity fundraiser in the Last Year: Never true  . Ran Out of Food in the Last Year: Never true  Transportation Needs: No Transportation Needs  . Lack of Transportation (Medical): No  . Lack of Transportation (Non-Medical): No  Physical Activity:   . Days of Exercise per Week:   . Minutes of Exercise per Session:   Stress:   . Feeling of Stress :   Social Connections:   . Frequency of Communication with Friends and Family:   . Frequency of Social Gatherings with Friends and Family:   . Attends Religious Services:   . Active Member of Clubs or Organizations:   . Attends Archivist Meetings:   [name] [name] Marital Status:   Intimate Partner Violence:   . Fear of Current or Ex-Partner:   . Emotionally Abused:   [name] [name] Physically Abused:   . Sexually Abused:      Review of Systems: General: negative for chills, fever, night sweats or weight changes.  Cardiovascular: negative for chest pain, dyspnea on exertion, edema, orthopnea, palpitations, paroxysmal nocturnal dyspnea or shortness of breath Dermatological: negative for rash Respiratory: negative for cough or wheezing Urologic: negative for hematuria Abdominal: negative for nausea, vomiting, diarrhea, bright red blood per rectum, melena, or hematemesis Neurologic: negative for visual changes, syncope, or dizziness All other systems reviewed and are otherwise negative except as noted above.    Blood pressure 138/84, pulse (!) 58, height 5\' 9"  (1.753 m), weight 202 lb (91.6 kg), SpO2 97 %.  General appearance: alert and no distress Neck: no  adenopathy, no JVD, supple, symmetrical, trachea midline, thyroid not enlarged, symmetric, no tenderness/mass/nodules and Soft bilateral carotid bruits Lungs: clear to auscultation bilaterally Heart: regular rate and rhythm, S1, S2 normal, no murmur, click, rub or gallop Extremities: extremities normal, atraumatic, no cyanosis or edema Pulses: 2+ and symmetric Skin: Skin color, texture, turgor normal. No rashes or lesions Neurologic: Alert and oriented X 3, normal strength and tone. Normal symmetric reflexes. Normal coordination and gait  EKG not performed today  ASSESSMENT AND PLAN:   Claudication in peripheral vascular disease ([place]) History of PAD status post bilateral iliac stenting most recently by myself 11/15/2017 of the origin/proximal right common iliac artery.  His left iliac stent was patent.  He did have a 50% right external neck artery stenosis without a gradient.  Since that time he has had progressive lifestyle limiting claudication with Dopplers performed 05/12/2019 revealing a right ABI of 0.7 and a left of 0.96.  He did have high-frequency signals in his right common and external neck artery.  I suspect he had progression of disease.  He will need angiography and reintervention.  HLD (hyperlipidemia) History of hyperlipidemia on high-dose statin therapy in the past although he is not been on his medicines for the last 8 months because of financial considerations.  He is back on his medicines now.  We will recheck a lipid and liver profile.  Smoking Continue tobacco abuse with the intent to stop  Essential hypertension History of essential hypertension with blood pressure measured today 138/84.  He is on Lopressor.  S/P CABG x 3 History of CAD status post remote left main stenting in [place] August 2018 with subsequent admission for unstable angina 04/09/2017 cardiac catheterization performed by Dr. Cooper revealing 80% "in-stent restenosis.  He underwent CABG x3 by Dr.  Gerhardt 04/07/2017 with a [name] to his LAD, sequential veins to [name] and 2.  He currently denies chest pain.      Jason Frisbee J. Alcie Runions MD FACP,FACC,FAHA, FSCAI 05/19/2019 12:16 PM 

## 2019-05-19 NOTE — Assessment & Plan Note (Signed)
History of PAD status post bilateral iliac stenting most recently by myself 11/15/2017 of the origin/proximal right common iliac artery.  His left iliac stent was patent.  He did have a 50% right external neck artery stenosis without a gradient.  Since that time he has had progressive lifestyle limiting claudication with Dopplers performed 05/12/2019 revealing a right ABI of 0.7 and a left of 0.96.  He did have high-frequency signals in his right common and external neck artery.  I suspect he had progression of disease.  He will need angiography and reintervention.

## 2019-05-25 ENCOUNTER — Other Ambulatory Visit (HOSPITAL_COMMUNITY)
Admission: RE | Admit: 2019-05-25 | Discharge: 2019-05-25 | Disposition: A | Discharge status: Home / Self Care | Payer: Medicaid Other | Source: Ambulatory Visit | Attending: Cardiovascular Disease | Admitting: Cardiovascular Disease

## 2019-05-25 ENCOUNTER — Telehealth: Payer: Self-pay | Admitting: *Deleted

## 2019-05-25 DIAGNOSIS — Z20822 Contact with and (suspected) exposure to covid-19: Secondary | ICD-10-CM | POA: Insufficient documentation

## 2019-05-25 DIAGNOSIS — Z01812 Encounter for preprocedural laboratory examination: Secondary | ICD-10-CM | POA: Insufficient documentation

## 2019-05-25 LAB — HEPATIC FUNCTION PANEL
ALT: 20 IU/L (ref 0–44)
AST: 64 IU/L — ABNORMAL HIGH (ref 0–40)
Albumin: 4.3 g/dL (ref 4.0–5.0)
Alkaline Phosphatase: 132 IU/L — ABNORMAL HIGH (ref 39–117)
Bilirubin Total: 0.3 mg/dL (ref 0.0–1.2)
Bilirubin, Direct: 0.09 mg/dL (ref 0.00–0.40)
Total Protein: 7.3 g/dL (ref 6.0–8.5)

## 2019-05-25 LAB — SARS CORONAVIRUS 2 (TAT 6-24 HRS): SARS Coronavirus 2: NEGATIVE

## 2019-05-25 LAB — LIPID PANEL
Chol/HDL Ratio: 9.6 ratio — ABNORMAL HIGH (ref 0.0–5.0)
Cholesterol, Total: 230 mg/dL — ABNORMAL HIGH (ref 100–199)
HDL: 24 mg/dL — ABNORMAL LOW (ref >39)
LDL Chol Calc (NIH): 135 mg/dL — ABNORMAL HIGH (ref 0–99)
Triglycerides: 389 mg/dL — ABNORMAL HIGH (ref 0–149)
VLDL Cholesterol Cal: 71 mg/dL — ABNORMAL HIGH (ref 5–40)

## 2019-05-25 NOTE — Telephone Encounter (Addendum)
Pt contacted pre-LE angiogram scheduled at [facility] for: Monday May 29, 2019 1:30 PM Verified arrival time and place: [facility] Main Entrance [facility]) at: 11:30 AM   No solid food after midnight prior to cath, clear liquids until 5 AM day of procedure.  AM meds can be  taken pre-cath with sip of water including: ASA 81 mg Plavix 75 mg  Confirmed patient has responsible adult to drive home post procedure and observe 24 hours after arriving home: yes  You are allowed ONE visitor in the waiting room during your procedures. Both you and your visitor must wear masks.     COVID-19 Pre-Screening Questions:  . In the past 7 to 10 days have you had a cough,  shortness of breath, headache, congestion, fever (100 or greater) body aches, chills, sore throat, or sudden loss of taste or sense of smell? no . Have you been around anyone with known Covid 19 in the past 7 to 10 days? no . Have you been around anyone who is awaiting Covid 19 test results in the past 7 to 10 days? no . Have you been around anyone who  has mentioned symptoms of Covid 19 within the past 7 to 10 days? no   Reviewed procedure/mask/visitor instructions, COVID-19 screening questions with patient.

## 2019-05-29 ENCOUNTER — Other Ambulatory Visit: Payer: Self-pay

## 2019-05-29 ENCOUNTER — Ambulatory Visit (HOSPITAL_COMMUNITY)
Admission: RE | Admit: 2019-05-29 | Discharge: 2019-05-30 | Disposition: A | Discharge status: Home / Self Care | Payer: Medicaid Other | Attending: Cardiovascular Disease | Admitting: Cardiovascular Disease

## 2019-05-29 ENCOUNTER — Encounter (HOSPITAL_COMMUNITY)
Admission: RE | Disposition: A | Discharge status: Home / Self Care | Payer: Self-pay | Source: Home / Self Care | Attending: Cardiovascular Disease

## 2019-05-29 ENCOUNTER — Ambulatory Visit: Payer: Medicaid Other | Admitting: Internal Medicine

## 2019-05-29 DIAGNOSIS — Z885 Allergy status to narcotic agent status: Secondary | ICD-10-CM | POA: Insufficient documentation

## 2019-05-29 DIAGNOSIS — F17210 Nicotine dependence, cigarettes, uncomplicated: Secondary | ICD-10-CM | POA: Insufficient documentation

## 2019-05-29 DIAGNOSIS — I10 Essential (primary) hypertension: Secondary | ICD-10-CM | POA: Diagnosis present

## 2019-05-29 DIAGNOSIS — I70211 Atherosclerosis of native arteries of extremities with intermittent claudication, right leg: Secondary | ICD-10-CM

## 2019-05-29 DIAGNOSIS — E785 Hyperlipidemia, unspecified: Secondary | ICD-10-CM | POA: Diagnosis present

## 2019-05-29 DIAGNOSIS — Z7902 Long term (current) use of antithrombotics/antiplatelets: Secondary | ICD-10-CM | POA: Insufficient documentation

## 2019-05-29 DIAGNOSIS — I739 Peripheral vascular disease, unspecified: Secondary | ICD-10-CM

## 2019-05-29 DIAGNOSIS — F17200 Nicotine dependence, unspecified, uncomplicated: Secondary | ICD-10-CM | POA: Diagnosis present

## 2019-05-29 DIAGNOSIS — Z79899 Other long term (current) drug therapy: Secondary | ICD-10-CM | POA: Insufficient documentation

## 2019-05-29 DIAGNOSIS — Z951 Presence of aortocoronary bypass graft: Secondary | ICD-10-CM

## 2019-05-29 DIAGNOSIS — IMO0001 Reserved for inherently not codable concepts without codable children: Secondary | ICD-10-CM | POA: Diagnosis present

## 2019-05-29 DIAGNOSIS — Z7982 Long term (current) use of aspirin: Secondary | ICD-10-CM | POA: Insufficient documentation

## 2019-05-29 HISTORY — PX: ABDOMINAL AORTOGRAM: CATH118222

## 2019-05-29 HISTORY — PX: PERIPHERAL VASCULAR INTERVENTION: CATH118257

## 2019-05-29 LAB — POCT I-STAT, CHEM 8
BUN: 11 mg/dL (ref 6–20)
Calcium, Ion: 1.13 mmol/L — ABNORMAL LOW (ref 1.15–1.40)
Chloride: 106 mmol/L (ref 98–111)
Creatinine, Ser: 1.1 mg/dL (ref 0.61–1.24)
Glucose, Bld: 89 mg/dL (ref 70–99)
HCT: 45 % (ref 39.0–52.0)
Hemoglobin: 15.3 g/dL (ref 13.0–17.0)
Potassium: 4.5 mmol/L (ref 3.5–5.1)
Sodium: 141 mmol/L (ref 135–145)
TCO2: 25 mmol/L (ref 22–32)

## 2019-05-29 LAB — BASIC METABOLIC PANEL
Anion gap: 11 (ref 5–15)
BUN: 10 mg/dL (ref 6–20)
CO2: 23 mmol/L (ref 22–32)
Calcium: 8.3 mg/dL — ABNORMAL LOW (ref 8.9–10.3)
Chloride: 106 mmol/L (ref 98–111)
Creatinine, Ser: 1.22 mg/dL (ref 0.61–1.24)
GFR calc Af Amer: >60 mL/min (ref >60)
GFR calc non Af Amer: >60 mL/min (ref >60)
Glucose, Bld: 79 mg/dL (ref 70–99)
Potassium: 4.2 mmol/L (ref 3.5–5.1)
Sodium: 140 mmol/L (ref 135–145)

## 2019-05-29 LAB — CBC
HCT: 48.4 % (ref 39.0–52.0)
Hemoglobin: 16.4 g/dL (ref 13.0–17.0)
MCH: 29.2 pg (ref 26.0–34.0)
MCHC: 33.9 g/dL (ref 30.0–36.0)
MCV: 86.3 fL (ref 80.0–100.0)
Platelets: 296 10*3/uL (ref 150–400)
RBC: 5.61 MIL/uL (ref 4.22–5.81)
RDW: 14.5 % (ref 11.5–15.5)
WBC: 10.6 10*3/uL — ABNORMAL HIGH (ref 4.0–10.5)
nRBC: 0 % (ref 0.0–0.2)

## 2019-05-29 LAB — POCT ACTIVATED CLOTTING TIME
Activated Clotting Time: 357 seconds
Activated Clotting Time: 290 seconds
Activated Clotting Time: 224 seconds
Activated Clotting Time: 175 seconds

## 2019-05-29 LAB — GLUCOSE, CAPILLARY: Glucose-Capillary: 106 mg/dL — ABNORMAL HIGH (ref 70–99)

## 2019-05-29 SURGERY — ABDOMINAL AORTOGRAM
Anesthesia: LOCAL | Laterality: Right

## 2019-05-29 MED ORDER — LABETALOL HCL 5 MG/ML IV SOLN
INTRAVENOUS | Status: AC
  Filled 2019-05-29: qty 4

## 2019-05-29 MED ORDER — HYDRALAZINE HCL 20 MG/ML IJ SOLN
5.0000 mg | INTRAMUSCULAR | Status: AC | PRN
  Administered 2019-05-29 (×2): 5 mg via INTRAVENOUS

## 2019-05-29 MED ORDER — SODIUM CHLORIDE 0.9% FLUSH
3.0000 mL | Freq: Two times a day (BID) | INTRAVENOUS | Status: DC
  Administered 2019-05-30: 3 mL via INTRAVENOUS

## 2019-05-29 MED ORDER — SODIUM CHLORIDE 0.9 % WEIGHT BASED INFUSION
1.0000 mL/kg/h | INTRAVENOUS | Status: DC
  Administered 2019-05-29: 1 mL/kg/h via INTRAVENOUS

## 2019-05-29 MED ORDER — CLOPIDOGREL BISULFATE 75 MG PO TABS
75.0000 mg | ORAL_TABLET | Freq: Every day | ORAL | Status: DC
  Administered 2019-05-30: 75 mg via ORAL
  Filled 2019-05-29: qty 1

## 2019-05-29 MED ORDER — MORPHINE SULFATE (PF) 2 MG/ML IV SOLN
INTRAVENOUS | Status: AC
  Filled 2019-05-29: qty 1

## 2019-05-29 MED ORDER — SODIUM CHLORIDE 0.9 % WEIGHT BASED INFUSION
3.0000 mL/kg/h | INTRAVENOUS | Status: DC
  Administered 2019-05-29: 3 mL/kg/h via INTRAVENOUS

## 2019-05-29 MED ORDER — NITROGLYCERIN 1 MG/10 ML FOR IR/CATH LAB
INTRA_ARTERIAL | Status: AC
  Filled 2019-05-29: qty 10

## 2019-05-29 MED ORDER — MIDAZOLAM HCL 2 MG/2ML IJ SOLN
INTRAMUSCULAR | Status: AC
  Filled 2019-05-29: qty 2

## 2019-05-29 MED ORDER — ACETAMINOPHEN 325 MG PO TABS
650.0000 mg | ORAL_TABLET | ORAL | Status: DC | PRN
  Administered 2019-05-29 – 2019-05-30 (×3): 650 mg via ORAL
  Filled 2019-05-29 (×2): qty 2

## 2019-05-29 MED ORDER — HEPARIN (PORCINE) IN NACL 1000-0.9 UT/500ML-% IV SOLN
INTRAVENOUS | Status: DC | PRN
  Administered 2019-05-29 (×2): 500 mL

## 2019-05-29 MED ORDER — ONDANSETRON HCL 4 MG/2ML IJ SOLN: 4.0000 mg | Freq: Four times a day (QID) | INTRAMUSCULAR | Status: DC | PRN

## 2019-05-29 MED ORDER — IODIXANOL 320 MG/ML IV SOLN
INTRAVENOUS | Status: DC | PRN
  Administered 2019-05-29: 75 mL via INTRA_ARTERIAL

## 2019-05-29 MED ORDER — LIDOCAINE HCL (PF) 1 % IJ SOLN
INTRAMUSCULAR | Status: AC
  Filled 2019-05-29: qty 30

## 2019-05-29 MED ORDER — MIDAZOLAM HCL 2 MG/2ML IJ SOLN
INTRAMUSCULAR | Status: DC | PRN
  Administered 2019-05-29: 1 mg via INTRAVENOUS

## 2019-05-29 MED ORDER — NITROGLYCERIN 1 MG/10 ML FOR IR/CATH LAB
INTRA_ARTERIAL | Status: DC | PRN
  Administered 2019-05-29: 200 ug

## 2019-05-29 MED ORDER — HYDRALAZINE HCL 20 MG/ML IJ SOLN
INTRAMUSCULAR | Status: AC
  Filled 2019-05-29: qty 1

## 2019-05-29 MED ORDER — FENTANYL CITRATE (PF) 100 MCG/2ML IJ SOLN
INTRAMUSCULAR | Status: AC
  Filled 2019-05-29: qty 2

## 2019-05-29 MED ORDER — HEPARIN SODIUM (PORCINE) 1000 UNIT/ML IJ SOLN
INTRAMUSCULAR | Status: AC
  Filled 2019-05-29: qty 2

## 2019-05-29 MED ORDER — SODIUM CHLORIDE 0.9 % IV SOLN: 250.0000 mL | INTRAVENOUS | Status: DC | PRN

## 2019-05-29 MED ORDER — SODIUM CHLORIDE 0.9% FLUSH: 3.0000 mL | INTRAVENOUS | Status: DC | PRN

## 2019-05-29 MED ORDER — FAMOTIDINE 20 MG PO TABS: 20.0000 mg | ORAL_TABLET | Freq: Every day | ORAL | Status: DC | PRN

## 2019-05-29 MED ORDER — HEPARIN (PORCINE) IN NACL 1000-0.9 UT/500ML-% IV SOLN
INTRAVENOUS | Status: AC
  Filled 2019-05-29: qty 1000

## 2019-05-29 MED ORDER — CLOPIDOGREL BISULFATE 75 MG PO TABS: 75.0000 mg | ORAL_TABLET | Freq: Every day | ORAL | Status: DC

## 2019-05-29 MED ORDER — ASPIRIN EC 81 MG PO TBEC: 81.0000 mg | DELAYED_RELEASE_TABLET | Freq: Every day | ORAL | Status: DC

## 2019-05-29 MED ORDER — METOPROLOL TARTRATE 25 MG PO TABS
25.0000 mg | ORAL_TABLET | Freq: Two times a day (BID) | ORAL | Status: DC
  Administered 2019-05-29 – 2019-05-30 (×2): 25 mg via ORAL
  Filled 2019-05-29 (×2): qty 1

## 2019-05-29 MED ORDER — ASPIRIN EC 81 MG PO TBEC
81.0000 mg | DELAYED_RELEASE_TABLET | Freq: Every day | ORAL | Status: DC
  Administered 2019-05-30: 81 mg via ORAL
  Filled 2019-05-29: qty 1

## 2019-05-29 MED ORDER — ACETAMINOPHEN 325 MG PO TABS
ORAL_TABLET | ORAL | Status: AC
  Filled 2019-05-29: qty 2

## 2019-05-29 MED ORDER — FENTANYL CITRATE (PF) 100 MCG/2ML IJ SOLN
INTRAMUSCULAR | Status: DC | PRN
  Administered 2019-05-29 (×2): 25 ug via INTRAVENOUS

## 2019-05-29 MED ORDER — LABETALOL HCL 5 MG/ML IV SOLN
10.0000 mg | INTRAVENOUS | Status: DC | PRN
  Administered 2019-05-29: 10 mg via INTRAVENOUS

## 2019-05-29 MED ORDER — ASPIRIN 81 MG PO CHEW: 81.0000 mg | CHEWABLE_TABLET | ORAL | Status: DC

## 2019-05-29 MED ORDER — MORPHINE SULFATE (PF) 2 MG/ML IV SOLN
2.0000 mg | INTRAVENOUS | Status: DC | PRN
  Administered 2019-05-29: 2 mg via INTRAVENOUS

## 2019-05-29 MED ORDER — HEPARIN SODIUM (PORCINE) 1000 UNIT/ML IJ SOLN
INTRAMUSCULAR | Status: DC | PRN
  Administered 2019-05-29: 11000 [IU] via INTRAVENOUS

## 2019-05-29 MED ORDER — SODIUM CHLORIDE 0.9 % IV SOLN: INTRAVENOUS | Status: AC

## 2019-05-29 MED ORDER — LIDOCAINE HCL (PF) 1 % IJ SOLN
INTRAMUSCULAR | Status: DC | PRN
  Administered 2019-05-29: 35 mL

## 2019-05-29 MED ORDER — ATORVASTATIN CALCIUM 80 MG PO TABS
80.0000 mg | ORAL_TABLET | Freq: Every day | ORAL | Status: DC
  Administered 2019-05-29: 80 mg via ORAL
  Filled 2019-05-29: qty 1

## 2019-05-29 SURGICAL SUPPLY — 22 items
BALLN MUSTANG 4.0X40 75 (BALLOONS) ×3
BALLN MUSTANG 6X20X135 (BALLOONS) ×3
BALLOON MUSTANG 4.0X40 75 (BALLOONS) IMPLANT
BALLOON MUSTANG 6X20X135 (BALLOONS) IMPLANT
CATH ANGIO 5F PIGTAIL 65CM (CATHETERS) ×1 IMPLANT
CATH STRAIGHT 5FR 65CM (CATHETERS) ×1 IMPLANT
KIT ENCORE 26 ADVANTAGE (KITS) ×1 IMPLANT
KIT PV (KITS) ×3 IMPLANT
SHEATH BRITE TIP 7FR 35CM (SHEATH) ×1 IMPLANT
SHEATH PINNACLE 5F 10CM (SHEATH) ×1 IMPLANT
SHEATH PINNACLE 7F 10CM (SHEATH) ×1 IMPLANT
STENT ABSOLUTE PRO 8X40X135 (Permanent Stent) ×1 IMPLANT
STENT VIABAHN VBX 7X19X80 (Permanent Stent) ×1 IMPLANT
STOPCOCK MORSE 400PSI 3WAY (MISCELLANEOUS) ×1 IMPLANT
SYR MEDRAD MARK 7 150ML (SYRINGE) ×3 IMPLANT
TAPE VIPERTRACK RADIOPAQ (MISCELLANEOUS) IMPLANT
TAPE VIPERTRACK RADIOPAQUE (MISCELLANEOUS) ×3
TRANSDUCER W/STOPCOCK (MISCELLANEOUS) ×3 IMPLANT
TRAY PV CATH (CUSTOM PROCEDURE TRAY) ×3 IMPLANT
TUBING CIL FLEX 10 FLL-RA (TUBING) ×1 IMPLANT
WIRE HITORQ VERSACORE ST 145CM (WIRE) ×2 IMPLANT
WIRE VERSACORE LOC 115CM (WIRE) ×1 IMPLANT

## 2019-05-29 NOTE — Progress Notes (Signed)
Site area: right groin  Site Prior to Removal:  Level 0  Pressure Applied For 28 MINUTES    Minutes Beginning at 1850  Manual:   Yes.    Patient Status During Pull:  Stable,but anxious.  Post Pull Groin Site:  Level 0  Post Pull Instructions Given:  Yes.    Post Pull Pulses Present:  Yes.    Dressing Applied:  Yes.    Comments:  Bed rest started at [facility]

## 2019-05-29 NOTE — Interval H&P Note (Signed)
History and Physical Interval Note:  05/29/2019 2:39 PM  [name] [name]  has presented today for surgery, with the diagnosis of Lower limb ischemia.  The various methods of treatment have been discussed with the patient and family. After consideration of risks, benefits and other options for treatment, the patient has consented to  Procedure(s): LOWER EXTREMITY ANGIOGRAPHY (Right) as a surgical intervention.  The patient's history has been reviewed, patient examined, no change in status, stable for surgery.  I have reviewed the patient's chart and labs.  Questions were answered to the patient's satisfaction.     Nanetta Batty

## 2019-05-29 NOTE — Progress Notes (Signed)
Patient arrived to 4E-08 at this time. CHG bath completed. Gown changed. Tele applied. CCMD notified. Patient oriented to room and call bell. Patient on bedrest until 2220. VS stable. BP 148/86 (96). EKG NSR on the monitor. HR 74.Doppler pulses. R Popliteal, and Posterior Tibialis strong. R Dorsalis Pedis moderate. L Popliteal and Posterios Tibialis moderate. L Dorsalis Pedis weak. Wife at the bedside. Will continue to monitor. Willa Frater, RN

## 2019-05-29 NOTE — Telephone Encounter (Signed)
BMP/CBC not done 05/25/19, will order to be done on arrival to Short Stay 05/29/19.

## 2019-05-30 DIAGNOSIS — I739 Peripheral vascular disease, unspecified: Secondary | ICD-10-CM

## 2019-05-30 LAB — BASIC METABOLIC PANEL
Anion gap: 9 (ref 5–15)
BUN: 12 mg/dL (ref 6–20)
CO2: 22 mmol/L (ref 22–32)
Calcium: 8.1 mg/dL — ABNORMAL LOW (ref 8.9–10.3)
Chloride: 108 mmol/L (ref 98–111)
Creatinine, Ser: 1.22 mg/dL (ref 0.61–1.24)
GFR calc Af Amer: >60 mL/min (ref >60)
GFR calc non Af Amer: >60 mL/min (ref >60)
Glucose, Bld: 123 mg/dL — ABNORMAL HIGH (ref 70–99)
Potassium: 3.7 mmol/L (ref 3.5–5.1)
Sodium: 139 mmol/L (ref 135–145)

## 2019-05-30 LAB — CBC
HCT: 43.1 % (ref 39.0–52.0)
Hemoglobin: 14.3 g/dL (ref 13.0–17.0)
MCH: 28.5 pg (ref 26.0–34.0)
MCHC: 33.2 g/dL (ref 30.0–36.0)
MCV: 86 fL (ref 80.0–100.0)
Platelets: 253 10*3/uL (ref 150–400)
RBC: 5.01 MIL/uL (ref 4.22–5.81)
RDW: 14.6 % (ref 11.5–15.5)
WBC: 10.2 10*3/uL (ref 4.0–10.5)
nRBC: 0 % (ref 0.0–0.2)

## 2019-05-30 NOTE — Plan of Care (Signed)
  Problem: Education: Goal: Knowledge of General Education information will improve Description: Including pain rating scale, medication(s)/side effects and non-pharmacologic comfort measures 05/30/2019 1057 by Verne Grain, RN Outcome: Adequate for Discharge 05/30/2019 0913 by Verne Grain, RN Outcome: Adequate for Discharge   Problem: Health Behavior/Discharge Planning: Goal: Ability to manage health-related needs will improve 05/30/2019 1057 by Verne Grain, RN Outcome: Adequate for Discharge 05/30/2019 0913 by Verne Grain, RN Outcome: Adequate for Discharge   Problem: Clinical Measurements: Goal: Ability to maintain clinical measurements within normal limits will improve 05/30/2019 1057 by Verne Grain, RN Outcome: Adequate for Discharge 05/30/2019 0913 by Verne Grain, RN Outcome: Adequate for Discharge Goal: Will remain free from infection 05/30/2019 1057 by Verne Grain, RN Outcome: Adequate for Discharge 05/30/2019 0913 by Verne Grain, RN Outcome: Adequate for Discharge Goal: Diagnostic test results will improve 05/30/2019 1057 by Verne Grain, RN Outcome: Adequate for Discharge 05/30/2019 0913 by Verne Grain, RN Outcome: Adequate for Discharge Goal: Respiratory complications will improve 05/30/2019 1057 by Verne Grain, RN Outcome: Adequate for Discharge 05/30/2019 0913 by Verne Grain, RN Outcome: Adequate for Discharge Goal: Cardiovascular complication will be avoided 05/30/2019 1057 by Verne Grain, RN Outcome: Adequate for Discharge 05/30/2019 0913 by Verne Grain, RN Outcome: Adequate for Discharge   Problem: Activity: Goal: Risk for activity intolerance will decrease 05/30/2019 1057 by Verne Grain, RN Outcome: Adequate for Discharge 05/30/2019 0913 by Verne Grain, RN Outcome: Adequate for Discharge   Problem: Nutrition: Goal: Adequate nutrition will be maintained 05/30/2019 1057 by  Verne Grain, RN Outcome: Adequate for Discharge 05/30/2019 0913 by Verne Grain, RN Outcome: Adequate for Discharge   Problem: Coping: Goal: Level of anxiety will decrease 05/30/2019 1057 by Verne Grain, RN Outcome: Adequate for Discharge 05/30/2019 0913 by Verne Grain, RN Outcome: Adequate for Discharge   Problem: Elimination: Goal: Will not experience complications related to bowel motility 05/30/2019 1057 by Verne Grain, RN Outcome: Adequate for Discharge 05/30/2019 0913 by Verne Grain, RN Outcome: Adequate for Discharge Goal: Will not experience complications related to urinary retention 05/30/2019 1057 by Verne Grain, RN Outcome: Adequate for Discharge 05/30/2019 0913 by Verne Grain, RN Outcome: Adequate for Discharge   Problem: Pain Managment: Goal: General experience of comfort will improve 05/30/2019 1057 by Verne Grain, RN Outcome: Adequate for Discharge 05/30/2019 0913 by Verne Grain, RN Outcome: Adequate for Discharge   Problem: Safety: Goal: Ability to remain free from injury will improve 05/30/2019 1057 by Verne Grain, RN Outcome: Adequate for Discharge 05/30/2019 0913 by Verne Grain, RN Outcome: Adequate for Discharge   Problem: Skin Integrity: Goal: Risk for impaired skin integrity will decrease 05/30/2019 1057 by Verne Grain, RN Outcome: Adequate for Discharge 05/30/2019 0913 by Verne Grain, RN Outcome: Adequate for Discharge

## 2019-05-30 NOTE — Progress Notes (Signed)
Discharge instructions given to [name] [name].  Discussed new medications, medication changes and side effects.  Discussed activities and follow up appointments.  Discussed signs and symptoms to watch for and when to contact the physician.  Verbalized understanding.

## 2019-05-30 NOTE — Plan of Care (Signed)

## 2019-05-30 NOTE — Discharge Summary (Addendum)
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  The right external leg artery was 90% stenosed.  IMPRESSION: [name] [name] has progression of disease in his distal right common iliac artery and right extra iliac artery.  He has symptomatic lifestyle limiting claudication.  We will proceed with stenting of the distal right common and right external iliac artery.  Procedure Description: Patient received a total of 11,000 as of heparin with an ACT of 357.  A total of 75 cc of contrast was administered to the patient.  I exchanged the 5 French sheath over an 035 versa core wire for a 7 [place] Brite tip sheath.  I then predilated the externally Accardi artery with a 4 mm x 4 cm Mustang balloon over the versa core wire.  I then placed an 8 mm x 4 cm Abbott absolute Pro nitinol self-expanding stent across the right external iliac artery stenosis and postdilated with a 6 mm x 2 6 cm balloon resulting reduction of a 90% stenosis to 0% residual.  After demonstrating a 40 mmHg pressure drop across the distal edge of the previously placed right common iliac artery stent I restented this with a 7 mm x 19 mm VBX stent and put postdilated within the previously placed common iliac stent with the same 7 mm balloon resulting reduction of a  60% stenosis to 0% residual.  Patient tolerated procedure well.  The bright tip sheath was then exchanged for a short 7 French sheath over the versa core wire and secured in place.  Final Impression: Successful distal right common iliac artery PTA and covered stenting with a VBX covered stent and right external iliac artery stenting with a nitinol self-expanding stent.  The sheath will be removed once ACT falls below 170 and pressure held.  Patient be hydrated overnight and discharged home in the morning on aspirin Plavix.  We will obtain lower extremity arterial Doppler studies in our [facility] line office next week and I will see him back the week after in the office in follow-up.  He left the lab in stable condition.  History of Present Illness     [name] [name] is a 49 y.o. male with a hx of tobacco use, PAD with progressive claudication (s/p bilateral iliac stenting of the origin/proximal right common iliac artery), CAD s/p CABG ([name]>LAD, SVG>OM1 and OM2) 04/07/2017, HLD, and HTN.   He was seen by Dr. Allyson Sabal 05/19/19 with progressive PAD with right lower extremity claudication symptoms s/p Doppler studies performed 05/12/2019 revealing a decline in his right ABI to 0.7 with high-frequency signals in his right common and external iliac artery suggesting progression of disease and/or in-stent restenosis. Plan was to purse PV angiograpy with re-intervention.  Hospital Course     He underwent abdominal aortogram with peripheral vascular stent intervention 05/29/19 per Dr. Allyson Sabal which showed a  mild atherosclerotic abdominal aorta, left iliac stents widely patent, there was a 60% hypodense lesion at the distal edge of the previously placed right common iliac artery stent.  The right external leg artery was 90% stenosed.  A successful distal right common iliac artery PTA and covered stenting with a VBX covered stent and right external iliac artery stenting with a nitinol self-expanding stent was placed. Plan  is for aspirin Plavix.  We will obtain lower extremity arterial Doppler studies in our [facility] office next week and Dr. Allyson Sabal appointment the week after in the office in follow-up.   On day of discharge, creatinine remained stable at 1.22. All other lab work within normal limits. VSS. No pain or specific complaints. Ambulated without complication.    Hospital problems:  -Claudication in peripheral vascular disease: History of PAD status post bilateral iliac stenting most recently 11/15/2017 of the origin/proximal right common iliac artery. His left iliac stent was patent. He did have a 50% right external neck artery stenosis without a gradient.  Since that time he has had progressive lifestyle limiting claudication with Dopplers performed 05/12/2019 revealing a right ABI of 0.7 and a left of 0.96.  He did have high-frequency signals in his right common and external neck artery.  -A successful distal right common iliac artery PTA and covered stenting with a VBX covered stent and right external iliac artery stenting with a nitinol self-expanding stent was placed. Plan is for aspirin Plavix.  We will obtain lower extremity arterial Doppler studies in our [facility] office next week and Dr. Allyson Sabal appointment the week after in the office in follow-up.   -HLD (hyperlipidemia) History of hyperlipidemia on high-dose statin therapy in the past although he is not been on his medicines for the last 8 months because of financial considerations.  He is back on his medicines now.  We will recheck a lipid and liver profile at follow up visit   -Smoking Continue tobacco abuse with the intent to stop  -Essential hypertension History of essential hypertension with blood pressure measured today 138/84.  He is on Lopressor.  -S/P CABG x 3 History of CAD status post remote left main stenting in [facility] August 2018 with subsequent admission for unstable angina 04/09/2017 cardiac catheterization performed by  Dr. [name] revealing 80% "in-stent restenosis.  He underwent CABG x3 by Dr. [name] 04/07/2017 with a [name] to his LAD, sequential veins to [name] and 2.  He currently denies chest pain.   General: Well developed, well nourished, NAD Neck: Negative for carotid bruits. No JVD Lungs:Clear to ausculation bilaterally. No wheezes, rales, or rhonchi. Breathing is unlabored. Cardiovascular: RRR with S1 S2. No murmurs Abdomen: Soft, non-tender, non-distended. No obvious abdominal masses. Extremities: No edema. Radial pulses 2+ bilaterally. Neuro: Alert and oriented. No focal deficits. No facial asymmetry. MAE spontaneously. Psych: Responds to questions appropriately with normal affect.     Consultants: None   The patient was seen and examined by Dr. Jens Som who feels that he is stable and ready for discharged today, 05/30/19.   Did the patient have an acute coronary syndrome (MI, NSTEMI, STEMI, etc) this admission?:  No                               Did the patient have a percutaneous coronary intervention (stent / angioplasty)?:  No.   _____________  Discharge Vitals Blood pressure 138/73, pulse 62, temperature 98.2 F (36.8 C), temperature source  Oral, resp. rate 16, height 5\' 9"  (1.753 m), weight 90.7 kg, SpO2 97 %.  Filed Weights   05/29/19 1131  Weight: 90.7 kg   Labs & Radiologic Studies    CBC Recent Labs    05/29/19 1200 05/29/19 1200 05/29/19 1457 05/30/19 0314  WBC 10.6*  --   --  10.2  HGB 16.4   < > 15.3 14.3  HCT 48.4   < > 45.0 43.1  MCV 86.3  --   --  86.0  PLT 296  --   --  253   < > = values in this interval not displayed.   Basic Metabolic Panel Recent Labs    16/10/96 1324 05/29/19 1324 05/29/19 1457 05/30/19 0314  NA 140   < > 141 139  K 4.2   < > 4.5 3.7  CL 106   < > 106 108  CO2 23  --   --  22  GLUCOSE 79   < > 89 123*  BUN 10   < > 11 12  CREATININE 1.22   < > 1.10 1.22  CALCIUM 8.3*  --   --  8.1*   < > = values in this interval not  displayed.   Liver Function Tests No results for input(s): AST, ALT, ALKPHOS, BILITOT, PROT, ALBUMIN in the last 72 hours. No results for input(s): LIPASE, AMYLASE in the last 72 hours. High Sensitivity Troponin:   No results for input(s): TROPONINIHS in the last 720 hours.  BNP Invalid input(s): POCBNP D-Dimer No results for input(s): DDIMER in the last 72 hours. Hemoglobin A1C No results for input(s): HGBA1C in the last 72 hours. Fasting Lipid Panel No results for input(s): CHOL, HDL, LDLCALC, TRIG, CHOLHDL, LDLDIRECT in the last 72 hours. Thyroid Function Tests No results for input(s): TSH, T4TOTAL, T3FREE, THYROIDAB in the last 72 hours.  Invalid input(s): FREET3 _____________  PERIPHERAL VASCULAR CATHETERIZATION  Result Date: 05/29/2019  [id number] LOCATION:  FACILITY: [facility] PHYSICIAN: Nanetta Batty, M.D. [birth year] DATE OF PROCEDURE:  05/29/2019 DATE OF DISCHARGE: PV Angiogram/Intervention History obtained from chart review.[name] [name] is a 25 y.o.  father of one 61 year old son who is referred by [name] registered ,nurse practitioner, for peripheral vascular evaluation. Ilastsaw him in the 12/07/2017. He has a history of acute tobacco abuse having smoked 40-60 pack years, treated hypertension and hyperlipidemia. She has never had a heart attack or stroke and denies chest pain or shortness of breath. He saw [name] years ago and ultimately underwent angiography/ 05/16/13 revealing bilateral iliac disease both of which were stented with balloon expandable and self expanding stents. He enjoyed symptomatic relief for approximately one year and then developed recurrent left calf claudication. Recent Dopplers performed 05/02/15 revealed a left ABI 0.78 with a high-frequency signal in his left external iliac artery. I performed lower extremity angiography on him 06/06/15 revealing a patent left common and external iliac artery stent with a 95% stenosis at the distal edge of the  proximal stent which I restented with a 9 mm x 3 cm nitinol self expanding stent. His symptoms of claudication have resolved and his Dopplers have normalized. He has in addition stop smoking by his account. He had a left main stent placed in [facility] August of last year and underwent repeat cardiac catheterization by Dr. 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 He underwent coronary artery bypass grafting x3 by Dr. [name] 04/07/2017 with a [name] to the LAD and  vein to [name] and 2. He is done well since. He has stopped smoking.  I performed peripheral angiography and intervention on him 11/07/2017 with stenting of his right common iliac artery with a 7 mm x 29 mm long balloon expandable stent. The previous stent I placed in his left leg were widely patent angiographically. His follow-up Doppler studies normalized and his claudication has resolved.  Since I saw him a year and a half ago he has developed progressive right lower extremity claudication with Doppler studies performed 05/12/2019 revealing a decline in his right ABI to 0.7 with high-frequency signals in his right common and external iliac artery suggesting progression of disease and/or in-stent restenosis.  He does continue to smoke.  Unfortunately, he was out of all of his medicines for 8 months up until recently when he restarted those.  He did have a 2D echo performed 05/09/2019 revealing slightly lower EF than in the past in the 45 to 50% range with an inferoapical Willmann motion abnormality.  Pre Procedure Diagnosis: Peripheral arterial disease Post Procedure Diagnosis: Peripheral arterial disease Operators: Dr. Nanetta Batty Procedures Performed:  1.  Ultrasound-guided right common femoral access  2.  Abdominal aortogram/bilateral iliac angiogram  3.  PTA stent right extra iliac artery  4.  PTA covered stent distal right common iliac artery PROCEDURE DESCRIPTION: The patient was brought to the  second floor [place] [facility] cath lab in the the postabsorptive state. He was premedicated with IV Versed and fentanyl. His right groin was prepped and shaved in usual sterile fashion. Xylocaine 1% was used for local anesthesia. A 5 French sheath was inserted into the right common femoral artery using standard Seldinger technique.  Ultrasound was used to identify the right common femoral artery and guide access.  A digital image was obtained and placed the patient's chart.  A 5 French pigtail catheters placed the distal abdominal aorta.  Distal abdominal aortography and bilateral iliac angiography were performed.  Isovue dye was used for the entirety of the case.  Retrograde aortic pressures monitored to the case.  A pullback gradient was obtained across the right common iliac artery using a 5 French angled catheter after the administration of 200 mcg of intra-arterial nitroglycerin.  Angiographic Data: 1: Abdominal aorta-mildly atherosclerotic 2: Left lower extremity-left iliac stents widely patent 3: Right lower extremity-there was a 60% hypodense lesion at the distal edge of the previously placed right common iliac artery stent.  The right external leg artery was 90% stenosed.   [name] [name] has progression of disease in his distal right common iliac artery and right extra iliac artery.  He has symptomatic lifestyle limiting claudication.  We will proceed with stenting of the distal right common and right external iliac artery. Procedure Description: Patient received a total of 11,000 as of heparin with an ACT of 357.  A total of 75 cc of contrast was administered to the patient.  I exchanged the 5 French sheath over an 035 versa core wire for a 7 [place] Brite tip sheath.  I then predilated the externally Accardi artery with a 4 mm x 4 cm Mustang balloon over the versa core wire.  I then placed an 8 mm x 4 cm Abbott absolute Pro nitinol self-expanding stent across the right external iliac artery stenosis and  postdilated with a 6 mm x 2 6 cm balloon resulting reduction of a 90% stenosis to 0% residual.  After demonstrating a 40 mmHg pressure drop across the distal edge of the  previously placed right common iliac artery stent I restented this with a 7 mm x 19 mm VBX stent and put postdilated within the previously placed common iliac stent with the same 7 mm balloon resulting reduction of a 60% stenosis to 0% residual.  Patient tolerated procedure well.  The bright tip sheath was then exchanged for a short 7 French sheath over the versa core wire and secured in place. Final Impression: Successful distal right common iliac artery PTA and covered stenting with a VBX covered stent and right external iliac artery stenting with a nitinol self-expanding stent.  The sheath will be removed once ACT falls below 170 and pressure held.  Patient be hydrated overnight and discharged home in the morning on aspirin Plavix.  We will obtain lower extremity arterial Doppler studies in our [facility] line office next week and I will see him back the week after in the office in follow-up.  He left the lab in stable condition. Nanetta Batty. MD, [facility] 05/29/2019 3:54 PM   VAS [place] ABI WITH/WO TBI  Result Date: 05/13/2019 LOWER EXTREMITY DOPPLER STUDY Indications: Follow-up right common iliac stent and PTA on 11/15/17 and history              of left common and external iliac PTA/stenting. Patient complains              of leg fatigue and weakness that has been worsening over the last              few months. He states that walking from the parking lot back to the              car to get his mask today that the legs felt weak and tired. His              leg pain makes it difficult to stand for long periods of time. He              notes the pain is in his back, hip, groin and upper thighs. High Risk Factors: Hypertension, hyperlipidemia, current smoker, prior MI,                    coronary artery disease.  Vascular Interventions: Distal right  common iliac PTA and stent 11/15/17.                         History of left common iliac and external iliac                         PTA/stenting 04/2013 by Dr. Jacinto Halim. Last left PTA and                         distal common iliac restenting on 06/06/15. Comparison Study: In 7/20, ABIs were 1.0 on the right and 1.12 on the left. Performing Technologist: Olegario Shearer RVT  Examination Guidelines: A complete evaluation includes at minimum, Doppler waveform signals and systolic blood pressure reading at the level of bilateral brachial, anterior tibial, and posterior tibial arteries, when vessel segments are accessible. Bilateral testing is considered an integral part of a complete examination. Photoelectric Plethysmograph (PPG) waveforms and toe systolic pressure readings are included as required and additional duplex testing as needed. Limited examinations for reoccurring indications may be performed as noted.  ABI Findings: +---------+------------------+-----+----------+--------+ Right    Rt Pressure (mmHg)IndexWaveform  Comment  +---------+------------------+-----+----------+--------+ Brachial 160                                       +---------+------------------+-----+----------+--------+  ATA      99                0.60 monophasic         +---------+------------------+-----+----------+--------+ PTA      115               0.70 monophasic         +---------+------------------+-----+----------+--------+ PERO     100               0.61 monophasic         +---------+------------------+-----+----------+--------+ Great Toe96                0.59 Abnormal           +---------+------------------+-----+----------+--------+ +---------+------------------+-----+----------+-------+ Left     Lt Pressure (mmHg)IndexWaveform  Comment +---------+------------------+-----+----------+-------+ Brachial 164                                       +---------+------------------+-----+----------+-------+ ATA      157               0.96 monophasic        +---------+------------------+-----+----------+-------+ PTA      151               0.92 biphasic          +---------+------------------+-----+----------+-------+ PERO     151               0.92 monophasic        +---------+------------------+-----+----------+-------+ Randie Heinz Toe164               1.00 Abnormal          +---------+------------------+-----+----------+-------+ +-------+-----------+-----------+------------+------------+ ABI/TBIToday's ABIToday's TBIPrevious ABIPrevious TBI +-------+-----------+-----------+------------+------------+ Right  0.70       0.59       1.0         0.65         +-------+-----------+-----------+------------+------------+ Left   0.96       1.0        1.12        1.03         +-------+-----------+-----------+------------+------------+ Right ABIs appear decreased compared to prior study on 08/09/18. Left ABIs appear mildly decreased compared to prior study on 08/09/18 but still in the normal range.  Summary: Right: Resting right ankle-brachial index indicates moderate right lower extremity arterial disease. The right toe-brachial index is abnormal. Left: Resting left ankle-brachial index is within normal range. No evidence of significant left lower extremity arterial disease. The left toe-brachial index is normal.  *See table(s) above for measurements and observations. See aorta-iliac duplex report.  Vascular consult recommended. Electronically signed by Julien Nordmann MD on 05/13/2019 at 2:56:14 PM.    Final    ECHOCARDIOGRAM COMPLETE  Result Date: 05/17/2019    ECHOCARDIOGRAM REPORT   Patient Name:   [name] [name] Date of Exam: 05/17/2019 Medical Rec #:  [id number]      Height:       69.0 in Accession #:    [id number]     Weight:       197.8 lb Date of Birth:  30-Mar-1970      BSA:          2.056 m Patient Age:    48 years       BP:            124/82 mmHg Patient Gender: M  HR:           59 bpm. Exam Location:  [address] Procedure: 2D Echo, Cardiac Doppler and Color Doppler Indications:    I25.10  History:        Patient has prior history of Echocardiogram examinations, most                 recent 04/09/2017. CAD and Previous Myocardial Infarction, Prior                 CABG, Signs/Symptoms:Shortness of Breath; Risk                 Factors:Hypertension, Dyslipidemia and Former Smoker.  Sonographer:    Samule Ohm RDCS Referring Phys: [id number] CALLIE E GOODRICH IMPRESSIONS  1. Septal apical and inferior Sayas hypokinesis. Small area of akinesis/dyskinesis in the inferior apex. Left ventricular ejection fraction, by estimation, is 45 to 50%. The left ventricle has mildly decreased function. The left ventricle demonstrates regional Fredell motion abnormalities (see scoring diagram/findings for description). Left ventricular diastolic parameters were normal.  2. Right ventricular systolic function is normal. The right ventricular size is normal.  3. The mitral valve is normal in structure. Trivial mitral valve regurgitation. No evidence of mitral stenosis.  4. The aortic valve is tricuspid. Aortic valve regurgitation is not visualized. Mild aortic valve sclerosis is present, with no evidence of aortic valve stenosis.  5. The inferior vena cava is normal in size with greater than 50% respiratory variability, suggesting right atrial pressure of 3 mmHg. FINDINGS  Left Ventricle: Septal apical and inferior Letson hypokinesis. Small area of akinesis/dyskinesis in the inferior apex. Left ventricular ejection fraction, by estimation, is 45 to 50%. The left ventricle has mildly decreased function. The left ventricle demonstrates regional Marzette motion abnormalities. The left ventricular internal cavity size was normal in size. There is no left ventricular hypertrophy. Left ventricular diastolic parameters were normal. Right Ventricle: The right  ventricular size is normal. No increase in right ventricular Haston thickness. Right ventricular systolic function is normal. Left Atrium: Left atrial size was normal in size. Right Atrium: Right atrial size was normal in size. Pericardium: There is no evidence of pericardial effusion. Mitral Valve: The mitral valve is normal in structure. Normal mobility of the mitral valve leaflets. Trivial mitral valve regurgitation. No evidence of mitral valve stenosis. Tricuspid Valve: The tricuspid valve is normal in structure. Tricuspid valve regurgitation is trivial. No evidence of tricuspid stenosis. Aortic Valve: The aortic valve is tricuspid. Aortic valve regurgitation is not visualized. Mild aortic valve sclerosis is present, with no evidence of aortic valve stenosis. Pulmonic Valve: The pulmonic valve was normal in structure. Pulmonic valve regurgitation is trivial. No evidence of pulmonic stenosis. Aorta: The aortic root is normal in size and structure. Venous: The inferior vena cava is normal in size with greater than 50% respiratory variability, suggesting right atrial pressure of 3 mmHg. IAS/Shunts: No atrial level shunt detected by color flow Doppler.  LEFT VENTRICLE PLAX 2D LVIDd:         4.90 cm  Diastology LVIDs:         3.10 cm  LV e' lateral:   13.70 cm/s LV PW:         1.00 cm  LV E/e' lateral: 5.4 LV IVS:        1.00 cm  LV e' medial:    9.36 cm/s LVOT diam:     2.00 cm  LV E/e' medial:  7.9 LV SV:  59 LV SV Index:   29 LVOT Area:     3.14 cm  RIGHT VENTRICLE            IVC RV S prime:     9.90 cm/s  IVC diam: 1.20 cm TAPSE (M-mode): 1.8 cm LEFT ATRIUM             Index       RIGHT ATRIUM           Index LA diam:        3.20 cm 1.56 cm/m  RA Pressure: 3.00 mmHg LA Vol (A2C):   40.0 ml 19.45 ml/m RA Area:     14.00 cm LA Vol (A4C):   44.0 ml 21.40 ml/m RA Volume:   34.30 ml  16.68 ml/m LA Biplane Vol: 46.3 ml 22.52 ml/m  AORTIC VALVE LVOT Vmax:   103.00 cm/s LVOT Vmean:  64.100 cm/s LVOT VTI:     0.187 m  AORTA Ao Root diam: 3.20 cm Ao Asc diam:  2.80 cm MV E velocity: 73.70 cm/s  TRICUSPID VALVE MV A velocity: 68.60 cm/s  Estimated RAP:  3.00 mmHg MV E/A ratio:  1.07                            SHUNTS                            Systemic VTI:  0.19 m                            Systemic Diam: 2.00 cm Charlton Haws MD Electronically signed by Charlton Haws MD Signature Date/Time: 05/17/2019/12:12:10 PM    Final    VAS US CAROTID  Result Date: 05/08/2019 Carotid Arterial Duplex Study Indications:       Carotid artery disease and Patient denies any cerebrovascular                    symptoms. Risk Factors:      Hypertension, hyperlipidemia, current smoker, prior MI,                    coronary artery disease. Other Factors:     S/P CABG x 3. Comparison Study:  In 03/2017, a carotid duplex showed a RICA velocity of 147/45                    cm/s and a LICA velocity of 205/76 cm/s. Performing Technologist: Dondra Prader RVT  Examination Guidelines: A complete evaluation includes B-mode imaging, spectral Doppler, color Doppler, and power Doppler as needed of all accessible portions of each vessel. Bilateral testing is considered an integral part of a complete examination. Limited examinations for reoccurring indications may be performed as noted.  Right Carotid Findings: +----------+--------+--------+--------+------------------+--------+           PSV cm/sEDV cm/sStenosisPlaque DescriptionComments +----------+--------+--------+--------+------------------+--------+ CCA Prox  130     25                                         +----------+--------+--------+--------+------------------+--------+ CCA Distal67      23                                         +----------+--------+--------+--------+------------------+--------+  ICA Prox  139     51      40-59%  smooth                     +----------+--------+--------+--------+------------------+--------+ ICA Mid   120     52                                          +----------+--------+--------+--------+------------------+--------+ ICA Distal113     47                                         +----------+--------+--------+--------+------------------+--------+ ECA       116     28                                         +----------+--------+--------+--------+------------------+--------+ +----------+--------+-------+----------------+-------------------+           PSV cm/sEDV cmsDescribe        Arm Pressure (mmHG) +----------+--------+-------+----------------+-------------------+ Subclavian200            Multiphasic, [id number]                 +----------+--------+-------+----------------+-------------------+ +---------+--------+--+--------+--+---------+ VertebralPSV cm/s53EDV cm/s18Antegrade +---------+--------+--+--------+--+---------+  Left Carotid Findings: +----------+--------+--------+--------+------------------+--------+           PSV cm/sEDV cm/sStenosisPlaque DescriptionComments +----------+--------+--------+--------+------------------+--------+ CCA Prox  130     21                                         +----------+--------+--------+--------+------------------+--------+ CCA Distal31      10                                         +----------+--------+--------+--------+------------------+--------+ ICA Prox  175     55      40-59%  smooth                     +----------+--------+--------+--------+------------------+--------+ ICA Mid   182     46                                         +----------+--------+--------+--------+------------------+--------+ ICA Distal84      39                                         +----------+--------+--------+--------+------------------+--------+ ECA       218     34      >50%    smooth                     +----------+--------+--------+--------+------------------+--------+ +----------+--------+--------+----------------+-------------------+            PSV cm/sEDV cm/sDescribe        Arm Pressure (mmHG) +----------+--------+--------+----------------+-------------------+ Subclavian113             Multiphasic, [id number]                 +----------+--------+--------+----------------+-------------------+ +---------+--------+--+--------+--+---------+  VertebralPSV cm/s71EDV cm/s22Antegrade +---------+--------+--+--------+--+---------+   Summary: Right Carotid: Velocities in the right ICA are consistent with a 40-59%                stenosis. Left Carotid: Velocities in the left ICA are consistent with a 40-59% stenosis.               The ECA appears >50% stenosed. Vertebrals:  Bilateral vertebral arteries demonstrate antegrade flow. Subclavians: Normal flow hemodynamics were seen in bilateral subclavian              arteries. *See table(s) above for measurements and observations. Suggest follow up study in 12 months. Electronically signed by Lorine Bears MD on 05/08/2019 at 8:22:53 AM.    Final    VAS US AORTA/IVC/ILIACS  Result Date: 05/13/2019 ABDOMINAL AORTA STUDY Indications: Follow-up right common iliac stent and PTA on 11/15/17 and history              of left common and external iliac PTA/stenting. Patient complains              of leg fatigue and weakness that has been worsening over the last              few months. He states that walking from the parking lot back to the              car to get his mask today that the legs felt weak and tired. His              leg pain makes it difficult to stand for long periods of time. He              notes the pain is in his back, hip, groin and upper thighs. Risk Factors: Hypertension, hyperlipidemia, current smoker, prior MI, coronary               artery disease. Other Factors: Today's ABIs were 0.70 on the right and 0.96 on the left. Vascular Interventions: Distal right common iliac stent with PTA placed                         11/15/17. History of left common iliac and external                          iliac stents with PTA 04/2013 by Dr. Jacinto Halim. Last left PTA                         and distal common iliac stent was placed 06/06/15. Limitations: Air/bowel gas, patient discomfort and TECHNICALLY CHALLENGING EXAM due to bowel gas and patient pain.  Comparison Study: In 7/20, an aorta-iliac duplex showed PSV of 191 cm/sec in the                   right common iliac, 265 cm/sec in the right external iliac,                   189 cm/sec in the left common iliac and 254 cm/sec in the left                   external iliac artery. Performing Technologist: Olegario Shearer RVT  Examination Guidelines: A complete evaluation includes B-mode imaging, spectral Doppler, color Doppler, and power Doppler as needed of all accessible portions of each vessel. Bilateral testing is considered an integral part  of a complete examination. Limited examinations for reoccurring indications may be performed as noted.  Abdominal Aorta Findings: +-------------+-------+----------+------+------------------+--------+----------+ Location     AP (cm)Trans (cm)PSV   Waveform          ThrombusComments                                 (cm/s)                                     +-------------+-------+----------+------+------------------+--------+----------+ Proximal     2.20   2.40      75    biphasic                             +-------------+-------+----------+------+------------------+--------+----------+ Mid                           58    monophasic                           +-------------+-------+----------+------+------------------+--------+----------+ Distal                        36    monophasic                           +-------------+-------+----------+------+------------------+--------+----------+ RT CIA Prox                   199   monophasic                           +-------------+-------+----------+------+------------------+--------+----------+ RT CIA Mid                    252    monophasic                           +-------------+-------+----------+------+------------------+--------+----------+ RT CIA Distal                 410   stenotic/resistant        >50%                                                                     stenosis   +-------------+-------+----------+------+------------------+--------+----------+ RT EIA Prox                   494   stenotic                  >50%                                                                     stenosis,  severe     +-------------+-------+----------+------+------------------+--------+----------+ RT EIA Mid                    122   monophasic                dampened   +-------------+-------+----------+------+------------------+--------+----------+ RT EIA Distal                 111   monophasic                           +-------------+-------+----------+------+------------------+--------+----------+ LT CIA Prox                   101   biphasic                             +-------------+-------+----------+------+------------------+--------+----------+ LT CIA Mid                    157   biphasic                             +-------------+-------+----------+------+------------------+--------+----------+ LT CIA Distal                 160   biphasic                             +-------------+-------+----------+------+------------------+--------+----------+ LT EIA Prox                   228   biphasic                             +-------------+-------+----------+------+------------------+--------+----------+ LT EIA Mid                    361   stenotic                  >50%                                                                     stenosis   +-------------+-------+----------+------+------------------+--------+----------+ LT EIA Distal                 138    biphasic                             +-------------+-------+----------+------+------------------+--------+----------+ The stent struts were difficult to visualize, hence all velocities are in the native table. The right common iliac and right external iliac artery velocities have increased from prior exam, now in the high-end >50% stenosis range. Dampened, monophasic flow is seen in the mid and distal right external iliac artery. The left common iliac artery appears patent and the stented left external iliac velocities appear increased now in the high-end >50% stenosis range. IVC/Iliac Findings: +--------+------+--------+--------+   IVC   PatentThrombusComments +--------+------+--------+--------+ IVC Proxpatent                 +--------+------+--------+--------+   Summary: Abdominal Aorta: No evidence of an abdominal aortic aneurysm  was visualized. The largest aortic measurement is 2.4 cm. Stenosis: +--------------------+-------------+---------------+---------------+ Location            Stenosis     Stent          Comments        +--------------------+-------------+---------------+---------------+ Right Common Iliac               50-99% stenosishigh-end        +--------------------+-------------+---------------+---------------+ Left Common Iliac                1-49% stenosis                 +--------------------+-------------+---------------+---------------+ Right External Iliac>50% stenosis               high-end/severe +--------------------+-------------+---------------+---------------+ Left External Iliac >50% stenosis               high-end        +--------------------+-------------+---------------+---------------+ Technically difficult study due to overlying bowel gas. Velocities in the right common iliac, right external iliac and left external iliac are elevated and have increased from prior exam.  *See table(s) above for measurements and observations. See ABI report.  Vascular consult recommended.  Electronically signed by Julien Nordmann MD on 05/13/2019 at 2:57:30 PM.    Final    Disposition   Pt is being discharged home today in good condition.  Follow-up Plans & Appointments   Follow-up Information    [facility] Follow up on 06/06/2019.   Specialty: Cardiology Why: follow up studies 06/06/19 at 1pm Contact information: [address] [place] [address] [phone]       Runell Gess, MD Follow up on 06/16/2019.   Specialties: Cardiology, Radiology Why: at [facility] information: [address] [place] [address] [phone]          Discharge Instructions    Call MD for:  difficulty breathing, headache or visual disturbances   Complete by: As directed    Call MD for:  extreme fatigue   Complete by: As directed    Call MD for:  hives   Complete by: As directed    Call MD for:  persistant dizziness or light-headedness   Complete by: As directed    Call MD for:  persistant nausea and vomiting   Complete by: As directed    Call MD for:  redness, tenderness, or signs of infection (pain, swelling, redness, odor or green/yellow discharge around incision site)   Complete by: As directed    Call MD for:  severe uncontrolled pain   Complete by: As directed    Call MD for:  temperature >100.4   Complete by: As directed    Diet - low sodium heart healthy   Complete by: As directed    Increase activity slowly   Complete by: As directed      Discharge Medications   Allergies as of 05/30/2019      Reactions   Codeine Other (See Comments)   Migraine      Medication List    TAKE these medications   aspirin EC 81 MG tablet Take 1 tablet (81 mg total) by mouth daily.   atorvastatin 80 MG tablet Commonly known as: LIPITOR Take 1 tablet (80 mg total) by mouth at bedtime.   clopidogrel 75 MG tablet Commonly known as: PLAVIX Take 1 tablet (75 mg total) by mouth  daily.   famotidine 20 MG tablet Commonly known as: PEPCID Take 20 mg by  mouth daily as needed for heartburn or indigestion.   metoprolol tartrate 25 MG tablet Commonly known as: LOPRESSOR Take 1 tablet (25 mg total) by mouth 2 (two) times daily.       Outstanding Labs/Studies   Follow up studies scheduled   Duration of Discharge Encounter   Greater than 30 minutes including physician time.  Signed, Georgie Chard, NP 05/30/2019, 9:23 AM As above, patient seen and examined.  No chest pain or dyspnea.  Some soreness at catheterization site.  On exam he does not have a right femoral hematoma or bruit.  We will discharge today on aspirin, Plavix and statin.  Patient counseled on discontinuing tobacco use.  Follow-up Dr. Allyson Sabal. >30 min PA and physician time D2 Olga Millers, MD

## 2019-06-06 ENCOUNTER — Other Ambulatory Visit (HOSPITAL_COMMUNITY): Payer: Self-pay | Admitting: Cardiovascular Disease

## 2019-06-06 ENCOUNTER — Other Ambulatory Visit: Payer: Self-pay

## 2019-06-06 ENCOUNTER — Ambulatory Visit (HOSPITAL_COMMUNITY)
Admission: RE | Admit: 2019-06-06 | Discharge: 2019-06-06 | Disposition: A | Discharge status: Home / Self Care | Payer: Self-pay | Source: Ambulatory Visit | Attending: Cardiology | Admitting: Cardiology

## 2019-06-06 DIAGNOSIS — I739 Peripheral vascular disease, unspecified: Secondary | ICD-10-CM

## 2019-06-06 DIAGNOSIS — Z95820 Peripheral vascular angioplasty status with implants and grafts: Secondary | ICD-10-CM

## 2019-06-07 ENCOUNTER — Telehealth: Payer: Self-pay | Admitting: Cardiovascular Disease

## 2019-06-07 NOTE — Telephone Encounter (Signed)
PT AWARE OF ABD AORTA RESULTS .Zack Seal

## 2019-06-07 NOTE — Telephone Encounter (Signed)
New Message     Pt is returning call to [facility] for results     Please call back

## 2019-06-12 ENCOUNTER — Encounter (HOSPITAL_COMMUNITY): Payer: Medicaid Other

## 2019-06-16 ENCOUNTER — Ambulatory Visit (INDEPENDENT_AMBULATORY_CARE_PROVIDER_SITE_OTHER): Payer: Self-pay | Admitting: Cardiovascular Disease

## 2019-06-16 ENCOUNTER — Other Ambulatory Visit: Payer: Self-pay

## 2019-06-16 ENCOUNTER — Encounter: Payer: Self-pay | Admitting: Cardiovascular Disease

## 2019-06-16 VITALS — BP 138/82 | HR 68 | Ht 69.0 in | Wt 203.0 lb

## 2019-06-16 DIAGNOSIS — E782 Mixed hyperlipidemia: Secondary | ICD-10-CM

## 2019-06-16 DIAGNOSIS — I739 Peripheral vascular disease, unspecified: Secondary | ICD-10-CM

## 2019-06-16 NOTE — Assessment & Plan Note (Signed)
History of hyperlipidemia on high-dose statin therapy with lipid profile performed 05/25/2019 revealing total cholesterol 230, LDL 135, HDL 24 triglyceride level of 389.  I am refer him to Dr. Rennis Golden for consideration of beginning Vascepa and Repatha.

## 2019-06-16 NOTE — Progress Notes (Signed)
06/16/2019 [name] [name]   Dec 11, 1970  [id number]  Primary Physician Lorre Munroe, NP Primary Cardiologist: [name] [name]  HPI:  [name] [name] is a 49 y.o.  father of one 33 year old son who is referred by [name] registered ,nurse practitioner, for peripheral vascular evaluation. Ilastsaw him in the  05/19/2019. He has a history of acute tobacco abuse having smoked 40-60 pack years, treated hypertension and hyperlipidemia. She has never had a heart attack or stroke and denies chest pain or shortness of breath. He saw [name] years ago and ultimately underwent angiography/ 05/16/13 revealing bilateral iliac disease both of which were stented with balloon expandable and self expanding stents. He enjoyed symptomatic relief for approximately one year and then developed recurrent left calf claudication. Recent Dopplers performed 05/02/15 revealed a left ABI 0.78 with a high-frequency signal in his left external iliac artery. I performed lower extremity angiography on him 06/06/15 revealing a patent left common and external iliac artery stent with a 95% stenosis at the distal edge of the proximal stent which I restented with a 9 mm x 3 cm nitinol self expanding stent. His symptoms of claudication have resolved and his Dopplers have normalized. He has in addition stop smoking by his account. He had a left main stent placed in [facility] August of last year and underwent repeat cardiac catheterization by Dr. 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 He underwent coronary artery bypass grafting x3 by Dr. [name] 04/07/2017 with a [name] to the LAD and vein to [name] and 2. He is done well since. He has stopped smoking.  I performed peripheral angiography and intervention on him 11/07/2017 with stenting of his right common iliac artery with a 7 mm x 29 mm long balloon expandable stent. The previous stent I placed in his  left leg were widely patent angiographically. His follow-up Doppler studies normalized and his claudication has resolved.  He had developed progressive right lower extremity claudication with Doppler studies performed 05/12/2019 revealing a decline in his right ABI to 0.7 with high-frequency signals in his right common and external iliac artery suggesting progression of disease and/or in-stent restenosis.  He does continue to smoke.  Unfortunately, he was out of all of his medicines for 8 months up until recently when he restarted those.  He did have a 2D echo performed 05/09/2019 revealing slightly lower EF than in the past in the 45 to 50% range with an inferoapical Capaldi motion abnormality.  I performed peripheral angiography on him 05/29/2019 revealing high-grade disease in his right common extra iliac artery both of which were restented.  He had a VBX stent placed in his right common iliac artery and a nitinol self-expanding stent in his external iliac artery.  His right ABI increased from 0.7 up to 0.98.  His claudication has resolved.   Current Meds  Medication Sig  . aspirin EC 81 MG tablet Take 1 tablet (81 mg total) by mouth daily.  [name] atorvastatin (LIPITOR) 80 MG tablet Take 1 tablet (80 mg total) by mouth at bedtime.  . clopidogrel (PLAVIX) 75 MG tablet Take 1 tablet (75 mg total) by mouth daily.  . famotidine (PEPCID) 20 MG tablet Take 20 mg by mouth daily as needed for heartburn or indigestion.  . metoprolol tartrate (LOPRESSOR) 25 MG tablet Take 1 tablet (25 mg total) by mouth 2 (two) times daily.   Current Facility-Administered Medications for  the 06/16/19 encounter (Office Visit) with Lorretta Harp, MD  Medication  . sodium chloride flush (NS) 0.9 % injection 3 mL     Allergies  Allergen Reactions  . Codeine Other (See Comments)    Migraine     Social History   Socioeconomic History  . Marital status: Divorced    Spouse name: Not on file  . Number of children: Not on  file  . Years of education: Not on file  . Highest education level: Not on file  Occupational History  . Occupation: Truck Geophysicist/field seismologist  Tobacco Use  . Smoking status: Former Smoker    Packs/day: 1.00    Years: 30.00    Pack years: 30.00    Types: Cigarettes    Quit date: 06/07/2015    Years since quitting: 4.0  . Smokeless tobacco: Former Systems developer    Types: Snuff  . Tobacco comment: "used snuff in my teens"  Substance and Sexual Activity  . Alcohol use: Yes    Alcohol/week: 0.0 standard drinks    Comment: 06/06/2015 "might have 1 beer/month"  . Drug use: No  . Sexual activity: Not Currently  Other Topics Concern  . Not on file  Social History Narrative  . Not on file   Social Determinants of Health   Financial Resource Strain: High Risk  . Difficulty of Paying Living Expenses: Very hard  Food Insecurity: No Food Insecurity  . Worried About Charity fundraiser in the Last Year: Never true  . Ran Out of Food in the Last Year: Never true  Transportation Needs: No Transportation Needs  . Lack of Transportation (Medical): No  . Lack of Transportation (Non-Medical): No  Physical Activity:   . Days of Exercise per Week:   . Minutes of Exercise per Session:   Stress:   . Feeling of Stress :   Social Connections:   . Frequency of Communication with Friends and Family:   . Frequency of Social Gatherings with Friends and Family:   . Attends Religious Services:   . Active Member of Clubs or Organizations:   . Attends Archivist Meetings:   [name] Marital Status:   Intimate Partner Violence:   . Fear of Current or Ex-Partner:   . Emotionally Abused:   [name] Physically Abused:   . Sexually Abused:      Review of Systems: General: negative for chills, fever, night sweats or weight changes.  Cardiovascular: negative for chest pain, dyspnea on exertion, edema, orthopnea, palpitations, paroxysmal nocturnal dyspnea or shortness of breath Dermatological: negative for rash Respiratory:  negative for cough or wheezing Urologic: negative for hematuria Abdominal: negative for nausea, vomiting, diarrhea, bright red blood per rectum, melena, or hematemesis Neurologic: negative for visual changes, syncope, or dizziness All other systems reviewed and are otherwise negative except as noted above.    Blood pressure 138/82, pulse 68, height 5\' 9"  (1.753 m), weight 203 lb (92.1 kg).  General appearance: alert and no distress Neck: no adenopathy, no carotid bruit, no JVD, supple, symmetrical, trachea midline and thyroid not enlarged, symmetric, no tenderness/mass/nodules Lungs: clear to auscultation bilaterally Heart: regular rate and rhythm, S1, S2 normal, no murmur, click, rub or gallop Extremities: extremities normal, atraumatic, no cyanosis or edema Pulses: 2+ and symmetric Skin: Skin color, texture, turgor normal. No rashes or lesions Neurologic: Alert and oriented X 3, normal strength and tone. Normal symmetric reflexes. Normal coordination and gait  EKG not performed today  ASSESSMENT AND PLAN:   Claudication in  peripheral vascular disease (HCC) History of peripheral arterial disease status post remote iliac stenting in the past (05/16/2013).  Because of right lower extremity recurrent claudication I reangiogrammed him 05/29/2019 and placed an additional stent in the right common and external iliac arteries.  ABI increased from 0.7 on the right up to 0.98.  His claudication on that side has essentially resolved.  He is on aspirin Plavix.  HLD (hyperlipidemia) History of hyperlipidemia on high-dose statin therapy with lipid profile performed 05/25/2019 revealing total cholesterol 230, LDL 135, HDL 24 triglyceride level of 389.  I am refer him to Dr. Rennis Golden for consideration of beginning Vascepa and Repatha.      [name] FACP,FACC,FAHA, [facility] 06/16/2019 9:15 AM

## 2019-06-16 NOTE — Patient Instructions (Addendum)
Medication Instructions:  No changes *If you need a refill on your cardiac medications before your next appointment, please call your pharmacy*   Lab Work: None ordered If you have labs (blood work) drawn today and your tests are completely normal, you will receive your results only by: [name] MyChart Message (if you have MyChart) OR . A paper copy in the mail If you have any lab test that is abnormal or we need to change your treatment, we will call you to review the results.   Testing/Procedures: Your physician has requested that you have an Aorta/Iliac Duplex in 6 months. This will be take place at [address] [facility], [address].    No food after 11PM the night before.  Water is OK. (Don't drink liquids if you have been instructed not to for ANOTHER test).  Take two Extra-Strength Gas-X capsules at bedtime the night before test.   Take an additional two Extra-Strength Gas-X capsules three (3) hours before the test or first thing in the morning.    Avoid foods that produce bowel gas, for 24 hours prior to exam (see below).    No breakfast, no chewing gum, no smoking or carbonated beverages.  Patient may take morning medications with water.  Come in for test at least 15 minutes early to register.   Your physician has requested that you have an ankle brachial index (ABI) in 6 months. During this test an ultrasound and blood pressure cuff are used to evaluate the arteries that supply the arms and legs with blood. Allow thirty minutes for this exam. There are no restrictions or special instructions. This will take place at [address] [facility], [address].    Follow-Up: At [facility], you and your health needs are our priority.  As part of our continuing mission to provide you with exceptional heart care, we have created designated Provider Care Teams.  These Care Teams include your primary Cardiologist (physician) and Advanced Practice Providers (APPs -  Physician Assistants and Nurse  Practitioners) who all work together to provide you with the care you need, when you need it.  We recommend signing up for the patient portal called "MyChart".  Sign up information is provided on this After Visit Summary.  MyChart is used to connect with patients for Virtual Visits (Telemedicine).  Patients are able to view lab/test results, encounter notes, upcoming appointments, etc.  Non-urgent messages can be sent to your provider as well.   To learn more about what you can do with MyChart, go to [email or web address].    Your next appointment:   6 month(s) after the dopplers  The format for your next appointment:   In Person  Provider:   Nanetta Batty, MD  A referral has been placed to Dr. Rennis Golden at [facility].

## 2019-06-16 NOTE — Assessment & Plan Note (Signed)
History of peripheral arterial disease status post remote iliac stenting in the past (05/16/2013).  Because of right lower extremity recurrent claudication I reangiogrammed him 05/29/2019 and placed an additional stent in the right common and external iliac arteries.  ABI increased from 0.7 on the right up to 0.98.  His claudication on that side has essentially resolved.  He is on aspirin Plavix.

## 2019-06-30 ENCOUNTER — Encounter: Payer: Self-pay | Admitting: Internal Medicine

## 2019-09-29 ENCOUNTER — Ambulatory Visit (INDEPENDENT_AMBULATORY_CARE_PROVIDER_SITE_OTHER): Payer: Self-pay | Admitting: Student

## 2019-09-29 ENCOUNTER — Encounter: Payer: Self-pay | Admitting: Student

## 2019-09-29 ENCOUNTER — Other Ambulatory Visit: Payer: Self-pay

## 2019-09-29 VITALS — BP 129/68 | HR 88 | Temp 98.0°F | Ht 69.0 in | Wt 196.8 lb

## 2019-09-29 DIAGNOSIS — R7401 Elevation of levels of liver transaminase levels: Secondary | ICD-10-CM

## 2019-09-29 DIAGNOSIS — E782 Mixed hyperlipidemia: Secondary | ICD-10-CM

## 2019-09-29 DIAGNOSIS — F17291 Nicotine dependence, other tobacco product, in remission: Secondary | ICD-10-CM

## 2019-09-29 DIAGNOSIS — I25810 Atherosclerosis of coronary artery bypass graft(s) without angina pectoris: Secondary | ICD-10-CM

## 2019-09-29 DIAGNOSIS — R7303 Prediabetes: Secondary | ICD-10-CM

## 2019-09-29 DIAGNOSIS — L304 Erythema intertrigo: Secondary | ICD-10-CM | POA: Insufficient documentation

## 2019-09-29 DIAGNOSIS — R42 Dizziness and giddiness: Secondary | ICD-10-CM | POA: Insufficient documentation

## 2019-09-29 DIAGNOSIS — I739 Peripheral vascular disease, unspecified: Secondary | ICD-10-CM

## 2019-09-29 DIAGNOSIS — Z0000 Encounter for general adult medical examination without abnormal findings: Secondary | ICD-10-CM | POA: Insufficient documentation

## 2019-09-29 LAB — GLUCOSE, CAPILLARY: Glucose-Capillary: 119 mg/dL — ABNORMAL HIGH (ref 70–99)

## 2019-09-29 LAB — POCT GLYCOSYLATED HEMOGLOBIN (HGB A1C): Hemoglobin A1C: 6.2 % — AB (ref 4.0–5.6)

## 2019-09-29 NOTE — Patient Instructions (Signed)
[name] [name],  It was a pleasure getting to know you today.  Here is a summary of we talked about:  1.  Peripheral vascular disease and heart disease: Please follow-up with your cardiologist for management of your heart disease.  2.  Prediabetes: Your A1c today 6.2.  We will work on diet with healthier options and low carbohydrates.  Please exercise as much as tolerated.  We will recheck an A1c in 3 months.  3.  Dizziness: This may be due to nerve impingement of your neck.  We will discuss getting imaging such as CT or MRI of your neck on the next visit.  4.  Rash: I will give you the name of the antifungal cream.  Please applied daily on the rash.  5.  High liver enzyme: I will check for infectious cause such as hepatitis A, B, C, HIV.  I will call you for the results.  Please contact us if you have any questions or concerns.  Take care,  Dr. Cyndie Chime

## 2019-09-29 NOTE — Assessment & Plan Note (Addendum)
History of CAD status post LVH stents and CABG in 2019.  His recent echo in April show EF of 45 to 50%'s, with small area of hypokinesis in the inferior apex.  Dr. Allyson Sabal is his cardiologist.  Plan: -Continue to follow up with Dr. Allyson Sabal -Continue aspirin 81 and Plavix 75 -Continue Lipitor 80 mg daily -Continue metoprolol 25 mg twice daily

## 2019-09-29 NOTE — Assessment & Plan Note (Signed)
Patient states that he has quit smoking 2 weeks ago

## 2019-09-29 NOTE — Assessment & Plan Note (Signed)
Patient has tried Neosporin with minimal relief.  Advised patient to try Clortrimazole.  Plan:  -Try Clortrimazole

## 2019-09-29 NOTE — Assessment & Plan Note (Addendum)
Patient complains of dizziness that started last July.  It happens when he looks up, denies associated headache, nausea, or loss of consciousness.  He denies room spinning sensation.  He denies photophobia.  When it happens he usually sit down and wait for the dizziness to go away.  He has about 3-4 episodes a month.  No reproducible dizziness with neck extension.  Negative carotid bruit.  Physical exam remarkable for neck tenderness radiates to upper cervical and head after perform Spurling test.  This may be secondary to nerve impingement.  Discussed with patient regarding getting imaging of his neck on the next visit.   Plan: -Consider obtaining cervical imaging such as CT or MRI on next visit

## 2019-09-29 NOTE — Assessment & Plan Note (Signed)
He declined Covid vaccine and flu vaccine today.  Patient states that vaccine can cause blood clots which he already has extensive history of PAD.  Patient still declining the vaccine after explanation that getting Covid will put him at a higher risk for forming blood clots.  Will discuss again on next visit.

## 2019-09-29 NOTE — Assessment & Plan Note (Addendum)
Patient has extensive history of PAD with multiple stents placed in the past.  He is seeing Dr. Allyson Sabal cardiologist who did these interventions.  Plan: -Continue to follow-up with Dr. Allyson Sabal -Continue aspirin 81 and Plavix 75 -Continue atorvastatin 80 mg

## 2019-09-29 NOTE — Assessment & Plan Note (Signed)
A1c is 6.2 today.  We talked about possible starting Metformin.  Patient decided to work on his diet and recheck A1c in 3 months.  Will consider Metformin if A1c not improved.  Plan: -Encourage healthier diet options -Recheck A1c in 3 months -Check microalbuminuria today
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
## 2019-09-29 NOTE — Assessment & Plan Note (Signed)
Patient is currently on atorvastatin 80 mg.  His last lipid profile in May 2021 showed total cholesterol 330, triglyceride 389, HDL 24 and LDL 135.  Patient is referred to Dr. Rennis Golden for better management of his lipid with possible addition of PCSK9.  Plan: -Continue taking atorvastatin 80 mg -Follow-up with Dr. Allyson Sabal -Follow-up with Dr. Rennis Golden on 11/03/2019

## 2019-09-29 NOTE — Assessment & Plan Note (Signed)
Recent ALT 20, AST 64, alk phos 132, total bill 0.3.  Patient denies history of alcohol.  Will check hepatitis antibodies and HIV.  Plan: -Pending hepatitis A, B, C, HIV results

## 2019-09-30 LAB — MICROALBUMIN / CREATININE URINE RATIO
Creatinine, Urine: 188 mg/dL
Microalb/Creat Ratio: 6 mg/g creat (ref 0–29)
Microalbumin, Urine: 11 ug/mL

## 2019-10-01 NOTE — Progress Notes (Signed)
Internal Medicine Clinic Attending  I saw and evaluated the patient.  I personally confirmed the key portions of the history and exam documented by Dr. Nguyen and I reviewed pertinent patient test results.  The assessment, diagnosis, and plan were formulated together and I agree with the documentation in the resident's note.\  

## 2019-10-02 LAB — HEPATITIS C ANTIBODY: Hep C Virus Ab: <0.1 s/co ratio (ref 0.0–0.9)

## 2019-10-02 LAB — HEPATITIS B CORE ANTIBODY, TOTAL: Hep B Core Total Ab: NEGATIVE

## 2019-10-02 LAB — HEPATITIS B E ANTIBODY: Hep B E Ab: NEGATIVE

## 2019-10-02 LAB — HEPATITIS B SURFACE ANTIBODY,QUALITATIVE: Hep B Surface Ab, Qual: NONREACTIVE

## 2019-10-02 LAB — HEPATITIS A ANTIBODY, TOTAL: hep A Total Ab: NEGATIVE

## 2019-10-02 LAB — HIV ANTIBODY (ROUTINE TESTING W REFLEX): HIV Screen 4th Generation wRfx: NONREACTIVE

## 2019-10-02 LAB — HEPATITIS B SURFACE ANTIGEN: Hepatitis B Surface Ag: NEGATIVE

## 2019-10-16 ENCOUNTER — Ambulatory Visit: Payer: Medicaid Other

## 2019-10-16 ENCOUNTER — Other Ambulatory Visit: Payer: Self-pay

## 2019-10-17 ENCOUNTER — Encounter: Payer: Medicaid Other | Admitting: Student
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
## 2019-10-23 ENCOUNTER — Encounter: Payer: Self-pay | Admitting: Internal Medicine

## 2019-10-23 ENCOUNTER — Ambulatory Visit (INDEPENDENT_AMBULATORY_CARE_PROVIDER_SITE_OTHER): Payer: Self-pay | Admitting: Internal Medicine

## 2019-10-23 ENCOUNTER — Other Ambulatory Visit: Payer: Self-pay

## 2019-10-23 VITALS — BP 113/74 | HR 65 | Temp 97.9°F | Ht 69.0 in | Wt 198.3 lb

## 2019-10-23 DIAGNOSIS — G8929 Other chronic pain: Secondary | ICD-10-CM

## 2019-10-23 DIAGNOSIS — M79605 Pain in left leg: Secondary | ICD-10-CM

## 2019-10-23 DIAGNOSIS — I739 Peripheral vascular disease, unspecified: Secondary | ICD-10-CM

## 2019-10-23 DIAGNOSIS — M79604 Pain in right leg: Secondary | ICD-10-CM

## 2019-10-23 DIAGNOSIS — F17200 Nicotine dependence, unspecified, uncomplicated: Secondary | ICD-10-CM

## 2019-10-23 DIAGNOSIS — S46912A Strain of unspecified muscle, fascia and tendon at shoulder and upper arm level, left arm, initial encounter: Secondary | ICD-10-CM

## 2019-10-23 DIAGNOSIS — M549 Dorsalgia, unspecified: Secondary | ICD-10-CM

## 2019-10-23 NOTE — Patient Instructions (Addendum)
Thank you for allowing [place] to provide your care today. Today we discussed your leg pain    I have ordered no labs for you. I will call if any are abnormal.    Please try to quit smoking as much as you can  I have made a referral to pharmacy assistance to help get you the medication you need. Please take your medicines, especially your aspirin and clopidogrel as prescribed.  Please follow-up in 3 months.    Should you have any questions or concerns please call the internal medicine clinic at [phone].     Peripheral Vascular Disease  Peripheral vascular disease (PVD) is a disease of the blood vessels that are not part of your heart and brain. A simple term for PVD is poor circulation. In most cases, PVD narrows the blood vessels that carry blood from your heart to the rest of your body. This can reduce the supply of blood to your arms, legs, and internal organs, like your stomach or kidneys. However, PVD most often affects a person's lower legs and feet. Without treatment, PVD tends to get worse. PVD can also lead to acute ischemic limb. This is when an arm or leg suddenly cannot get enough blood. This is a medical emergency. Follow these instructions at home: Lifestyle  Do not use any products that contain nicotine or tobacco, such as cigarettes and e-cigarettes. If you need help quitting, ask your doctor.  Lose weight if you are overweight. Or, stay at a healthy weight as told by your doctor.  Eat a diet that is low in fat and cholesterol. If you need help, ask your doctor.  Exercise regularly. Ask your doctor for activities that are right for you. General instructions  Take over-the-counter and prescription medicines only as told by your doctor.  Take good care of your feet: ? Wear comfortable shoes that fit well. ? Check your feet often for any cuts or sores.  Keep all follow-up visits as told by your doctor This is important. Contact a doctor if:  You have cramps in your  legs when you walk.  You have leg pain when you are at rest.  You have coldness in a leg or foot.  Your skin changes.  You are unable to get or have an erection (erectile dysfunction).  You have cuts or sores on your feet that do not heal. Get help right away if:  Your arm or leg turns cold, numb, and blue.  Your arms or legs become red, warm, swollen, painful, or numb.  You have chest pain.  You have trouble breathing.  You suddenly have weakness in your face, arm, or leg.  You become very confused or you cannot speak.  You suddenly have a very bad headache.  You suddenly cannot see. Summary  Peripheral vascular disease (PVD) is a disease of the blood vessels.  A simple term for PVD is poor circulation. Without treatment, PVD tends to get worse.  Treatment may include exercise, low fat and low cholesterol diet, and quitting smoking. This information is not intended to replace advice given to you by your health care provider. Make sure you discuss any questions you have with your health care provider. Document Revised: 12/18/2016 Document Reviewed: 02/13/2016 Elsevier Patient Education  2020 [employer].
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
## 2019-10-23 NOTE — Assessment & Plan Note (Addendum)
[name] [name] presents to [facility] w/ continued complaint of bilateral lower extremity and back pain. Acute on chronic issue. Exacerbated by exertion. Alleviated by rest. Minimal relief with OTC NSAIDs. He mentions having an upcoming appointment with Dr.Berry for further evaluation. He mentions taking his DAPT as prescribed for about 2 months after his stenting in May but have been non-adherent due to social stressors in his life as well as cost of his medications. He also mentions increasing his tobacco use due to stress from his mother passing away this month.  A/P Presenting w/ worsening claudications symptoms. Non-adherent to anti-platelet therapy. Possibly could be having re-stenosis of his previous stent, especially w/ extensive hx of PAD. Need to f/u with Dr.Berry. Will consult care management to assist w/ med cost as he cites cost of plavix as one of his barriers. - Referral to care management - F/u with Dr.Berry (will reach out to office to see if he can get earlier appt) - C/w asa 81mg , Clopidogrel 75mg , atorvastatin 80mg  daily - Advised to reduce tobacco use - Physical therapy referral for symptom management

## 2019-10-23 NOTE — Assessment & Plan Note (Signed)
Continues to smoke despite multiple episode of PAD and consequence of his tobacco use. Mother passed recently, exacerbating use. Had prolonged discussion regarding importance of avoiding tobacco use and risk of progressive PAD if he does not stop. Mr.Parkin expressed understanding but does not want pharmaceutical supplementation for tobacco cessation at this time due to cost.  - C/w encourage tobacco cessation

## 2019-10-24 NOTE — Progress Notes (Signed)
Internal Medicine Clinic Attending  Case discussed with Dr. Lee  At the time of the visit.  We reviewed the resident's history and exam and pertinent patient test results.  I agree with the assessment, diagnosis, and plan of care documented in the resident's note.    

## 2019-10-27 ENCOUNTER — Telehealth: Payer: Self-pay

## 2019-10-27 NOTE — Telephone Encounter (Signed)
    MA10/08/2019 1st Attempt  Name: [name] [name]   MRN: [id number]   DOB: [birth year]   AGE: 49 y.o.   GENDER: male   PCP Doran Stabler, DO.   10/27/19 Spoke with patient resources for Plavix. Plavix is not one of the medicines covered by the [employer] [facility].  Patient already has Good Rx and still cannot afford the $17-20 cost.  He has tried to apply for another type of Medicaid but was told he was not eligible.  With patient's permission emailed information about the [facility] Department of Public Health/Medication Assistance Program.  Patient was not interested in applying for anything else but would look at the information. He disconnected the call at this  point.   Sent message to [facility] via Epic about a less expensive medication being prescribed for the patient.     Bianna Haran, AAS Paralegal, [facility] Care Guide . Embedded Care Coordination [facility] Health  Care Management  [address] [place], [place] [address] [email or web address]  [phone]   [email or web address]

## 2019-10-27 NOTE — Telephone Encounter (Signed)
Thank you for reaching out to him

## 2019-10-30 ENCOUNTER — Telehealth: Payer: Self-pay

## 2019-10-30 NOTE — Telephone Encounter (Signed)
    MA10/11/2019   Name: [name] [name]   MRN: [id number]   DOB: 08/24/1970   AGE: 49 y.o.   GENDER: male   PCP Doran Stabler, DO.   10/30/19 Spoke with patient he plans on contacting the [facility] Dept of Public Health Medication Assistance Program.  No other resources are needed at this time.  Closing referral.    Kindred Heying, AAS Paralegal, [facility] Care Guide . Embedded Care Coordination [facility] Health  Care Management  [address] [place], [place] [address] [email or web address]  [phone]   [email or web address]

## 2019-10-30 NOTE — Telephone Encounter (Signed)
Thank you Millie.  Misty Stanley

## 2019-11-03 ENCOUNTER — Ambulatory Visit: Payer: Medicaid Other | Admitting: Internal Medicine

## 2019-12-11 ENCOUNTER — Telehealth (INDEPENDENT_AMBULATORY_CARE_PROVIDER_SITE_OTHER): Payer: Self-pay | Admitting: Internal Medicine

## 2019-12-11 ENCOUNTER — Telehealth: Payer: Self-pay | Admitting: Internal Medicine

## 2019-12-11 ENCOUNTER — Telehealth: Payer: Self-pay | Admitting: Licensed Clinical Social Worker

## 2019-12-11 ENCOUNTER — Encounter: Payer: Self-pay | Admitting: Internal Medicine

## 2019-12-11 DIAGNOSIS — I10 Essential (primary) hypertension: Secondary | ICD-10-CM

## 2019-12-11 DIAGNOSIS — I739 Peripheral vascular disease, unspecified: Secondary | ICD-10-CM

## 2019-12-11 DIAGNOSIS — Z951 Presence of aortocoronary bypass graft: Secondary | ICD-10-CM

## 2019-12-11 DIAGNOSIS — F17200 Nicotine dependence, unspecified, uncomplicated: Secondary | ICD-10-CM

## 2019-12-11 DIAGNOSIS — E782 Mixed hyperlipidemia: Secondary | ICD-10-CM

## 2019-12-11 NOTE — Telephone Encounter (Signed)
CSW received referral for pt due to multiple financial and coverage concerns. CSW left HIPAA compliant message on pt voicemail at [phone]. Will re-attempt as able to reach pt to see if Care Navigation team may be of any assistance.  Octavio Graves, MSW, LCSW [facility] Clinical Social Work

## 2019-12-11 NOTE — Telephone Encounter (Signed)
Patient called to review e-visit instructions. Patient aware that the following changes have been made: start repatha 140mg /ml q14 days - assistance application mailed Patient aware that they will need the following labs: fasting lipid panel to be completed before next visit Patient aware that they will need the following test(s): none  Recall for n/a entered.  Appointment scheduled 03/20/2020 @ 0930  No further assistance needed at this time.
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
## 2019-12-11 NOTE — Patient Instructions (Signed)
Medication Instructions:  Dr. Rennis Golden recommends Repatha 140mg /mL (PCSK9). This is an injectable cholesterol medication self-administered once every 14 days. This medication will likely need prior approval with your insurance company, which we will work on. If the medication is not approved initially, we may need to do an appeal with your insurance. We will keep you updated on this process.   Administer medication in area of fatty tissue such as abdomen, outer thigh, back up of arm - and rotate site with each injection Store medication in refrigerator until ready to administer - allow to sit at room temp for 30 mins - 1 hour prior to injection Dispose of medication in a SHARPS container - your pharmacy should be able to direct you on this and proper disposal   Please complete patient assistance application and return to Dr. office   *If you need a refill on your cardiac medications before your next appointment, please call your pharmacy*   Lab Work: FASTING lipid panel in 3-4 months to check cholesterol   If you have labs (blood work) drawn today and your tests are completely normal, you will receive your results only by: Blanchie Dessert MyChart Message (if you have MyChart) OR . A paper copy in the mail If you have any lab test that is abnormal or we need to change your treatment, we will call you to review the results.   Testing/Procedures: NONE   Follow-Up: At [facility], you and your health needs are our priority.  As part of our continuing mission to provide you with exceptional heart care, we have created designated Provider Care Teams.  These Care Teams include your primary Cardiologist (physician) and Advanced Practice Providers (APPs -  Physician Assistants and Nurse Practitioners) who all work together to provide you with the care you need, when you need it.  We recommend signing up for the patient portal called "MyChart".  Sign up information is provided on this After Visit  Summary.  MyChart is used to connect with patients for Virtual Visits (Telemedicine).  Patients are able to view lab/test results, encounter notes, upcoming appointments, etc.  Non-urgent messages can be sent to your provider as well.   To learn more about what you can do with MyChart, go to [facility].    Your next appointment:   3-4 month(s) - lipid clinic  The format for your next appointment:   In Person  Provider:   [email or web address] Hilty, MD   Other Instructions

## 2019-12-12 ENCOUNTER — Telehealth: Payer: Self-pay | Admitting: Licensed Clinical Social Worker

## 2019-12-12 NOTE — Telephone Encounter (Signed)
Additional elephone encounter opened in error. Await return call from pt, will re-attempt to reach pt as able.   Octavio Graves, MSW, LCSW [facility] Health Clinical Social Work

## 2019-12-12 NOTE — Progress Notes (Signed)
Heart and Vascular Care Navigation  12/12/2019  [name] [name] [birth year] [id number]  Reason for Referral:  Insurance/financial/medication assistance                                                                                                  Assessment:   Pt returned CSW call. CSW introduced self, role, reason for call. Pt had been referred by Dr. Rennis [name] and [name] Stanford, RN. Pt confirmed home address, contact information and self pay status.   He lives w/ his significant other [name] [name]. He is limited by fatigue to get around/complete household tasks. He shares he has an erratic sleep schedule as well. He has regular access to a car and takes himself to appointments-CSW shared [place] Transportation overview and let pt know to inform office if he has issue getting to his appointments. Pt has been approved for [employer] (food stamps) and is still receiving those at this time. His significant other covers the household costs which he feels bad that he cannot assist more with those expenses at this time. CSW shared there may be assistance for household costs if pt significant other is interested in that information  Pt has been linked in with Internal Medicine clinic for PCP but shares he had been requested to bring additional documentation. He has [facility] and is understandably frustrated by the limitations to it's coverage. Pt has not been consistent with taking his medications due to affordability. He has been in touch w/ DSS and shares that even though he submitted for full Medicaid that he was deemed still only eligible for Family Planning at this time. He had applied for disability, was denied, appealed the denial and just received notice that his appeal was also denied. He states he is reaching out to a lawyer to help determine his next steps.   CSW offered several assistance programs including Haematologist, MedAssist application and CAREs funding. Pt does have  access to internet but is spotty about checking and does not have a way to print. Therefore CSW offered to mail hard copies of applications along w/ instructions and my contact information.                                      HRT/VAS Care Coordination    Patients Home Cardiology Office [facility]   Outpatient Care Team Social Worker   Social Worker Name: Octavio Graves, LCSW, [facility]   Living arrangements for the past 2 months Single Family Home   Lives with: Pets; Significant Other   Patient Current Insurance Coverage Self-Pay  Family Planning Medicaid   Patient Has Concern With Paying Medical Bills Yes   Patient Concerns With Medical Bills affording bills not covered by [facility]   Medical Bill Referrals: CAFA application mailed 11/23   Does Patient Have Prescription Coverage? No   Patient Prescription Assistance Programs [place] Medassist   [place] Medassist Medications Atorvastatin Calcium;  Clopidogrel Bisulfate;  Metoprolol Tartrate   Home Assistive Devices/Equipment None  Social History:                                                                             SDOH Screenings   Alcohol Screen:   . Last Alcohol Screening Score (AUDIT): Not on file  Depression (PHQ2-9): Low Risk   . PHQ-2 Score: 4  Financial Resource Strain: High Risk  . Difficulty of Paying Living Expenses: Very hard  Food Insecurity: No Food Insecurity  . Worried About Programme researcher, broadcasting/film/video in the Last Year: Never true  . Ran Out of Food in the Last Year: Never true  Housing: Low Risk   . Last Housing Risk Score: 0  Physical Activity:   . Days of Exercise per Week: Not on file  . Minutes of Exercise per Session: Not on file  Social Connections:   . Frequency of Communication with Friends and Family: Not on file  . Frequency of Social Gatherings with Friends and Family: Not on file  . Attends Religious Services: Not on file  . Active Member of Clubs or Organizations: Not  on file  . Attends Banker Meetings: Not on file  . Marital Status: Not on file  Stress:   . Feeling of Stress : Not on file  Tobacco Use: Medium Risk  . Smoking Tobacco Use: Former Smoker  . Smokeless Tobacco Use: Former Dispensing optician Needs: No Transportation Needs  . Lack of Transportation (Medical): No  . Lack of Transportation (Non-Medical): No    SDOH Interventions: Financial Resources:   CARES application sent for pt significant other if needed.   Food Insecurity:  Food Insecurity Interventions: Intervention Not Indicated  Housing Insecurity:  Housing Interventions: Intervention Not Indicated  Transportation:   Transportation Interventions: Intervention Not Indicated    Other Care Navigation Interventions:     Provided Pharmacy assistance resources [place] Medassist   Follow-up plan:   Pt was mailed CAFA Application [employer] Financial Aid) w/ instructions, MedAssist Application w/ instructions and this writers contact number. CSW will f/u with pt to ensure he receives paperwork and assist w/ any questions.

## 2019-12-12 NOTE — Telephone Encounter (Signed)
CSW left additional HIPAA compliant message for pt on listed phone number voicemail [phone].   Octavio Graves, MSW, LCSW [facility] Health Clinical Social Work

## 2019-12-18 ENCOUNTER — Ambulatory Visit (HOSPITAL_COMMUNITY)
Admission: RE | Admit: 2019-12-18 | Discharge: 2019-12-18 | Disposition: A | Discharge status: Home / Self Care | Payer: Medicaid Other | Source: Ambulatory Visit | Attending: Cardiology | Admitting: Cardiology

## 2019-12-18 ENCOUNTER — Telehealth: Payer: Self-pay | Admitting: Licensed Clinical Social Worker

## 2019-12-18 ENCOUNTER — Other Ambulatory Visit: Payer: Self-pay

## 2019-12-18 ENCOUNTER — Other Ambulatory Visit (HOSPITAL_COMMUNITY): Payer: Self-pay | Admitting: Cardiovascular Disease

## 2019-12-18 ENCOUNTER — Ambulatory Visit (HOSPITAL_COMMUNITY)
Admission: RE | Admit: 2019-12-18 | Discharge: 2019-12-18 | Disposition: A | Discharge status: Home / Self Care | Payer: Self-pay | Source: Ambulatory Visit | Attending: Cardiology | Admitting: Cardiology

## 2019-12-18 DIAGNOSIS — Z95828 Presence of other vascular implants and grafts: Secondary | ICD-10-CM

## 2019-12-18 DIAGNOSIS — Z95820 Peripheral vascular angioplasty status with implants and grafts: Secondary | ICD-10-CM

## 2019-12-18 DIAGNOSIS — I739 Peripheral vascular disease, unspecified: Secondary | ICD-10-CM | POA: Insufficient documentation

## 2019-12-18 NOTE — Telephone Encounter (Signed)
CSW spoke w/ pt at the end of his appointment today. He shares he has not yet received the information that CSW had sent. I reviewed the documents that should be coming his way as well as some of the documents that will be requested so that he can prepare to gather these. He shares that he may have already been given one of these applications through the Internal Medicine Center. Pt to call CSW by end of the week if he does not receive this information. Pt aware CSW able to assist with compiling documents as needed.   [name] [name], MSW, LCSW [facility] Health Clinical Social Work

## 2019-12-27 ENCOUNTER — Telehealth: Payer: Self-pay | Admitting: Licensed Clinical Social Worker

## 2019-12-27 NOTE — Telephone Encounter (Signed)
CSW left HIPAA compliant message for pt at [phone] regarding resources mailed and discussed at appointment on 11/30. Will f/u again as able. Pt was aware he would need to complete application and begin gathering requested documents.   Octavio Graves, MSW, LCSW [facility] Heart/Vascular Care Navigation  [phone]

## 2019-12-29 ENCOUNTER — Telehealth: Payer: Self-pay | Admitting: Licensed Clinical Social Worker

## 2019-12-29 NOTE — Telephone Encounter (Signed)
Pt returned this writer's call. He has not yet started the assistance applications but has looked at them. He describes not feeling well over the past few weeks. I encouraged him to call his PCP and seek medical attention and to not let it linger too long.   Pt states understanding I will f/u with him next week to see if he has started the applications.   [name] [name], MSW, LCSW [facility] Heart/Vascular Care Navigation  [phone]

## 2020-01-01 ENCOUNTER — Telehealth: Payer: Self-pay | Admitting: Licensed Clinical Social Worker

## 2020-01-01 NOTE — Telephone Encounter (Signed)
CSW received a call from pt this morning, he is starting to collect the documents needed for applications. He shares frustration that it feels like there are a lot of documents that he needs to gather. He had specific questions regarding tax returns (he filed online) and how to indicate no income. CSW shared that he should be able to print needed documents off of his online profile or he can request them using the [id number] which he has been provided. Pt also guided on what documentation the assistance applications need to indicate support at home (notarized letter for CAFA and letter of support for MedAssist). I offered several times to put him in touch with a Market researcher which he declined. He has this writers contact information for any additional questions/concerns.   [name] [name], MSW, LCSW [facility] Heart/Vascular Care Navigation  [phone]

## 2020-01-31 ENCOUNTER — Telehealth: Payer: Self-pay | Admitting: Licensed Clinical Social Worker

## 2020-01-31 NOTE — Telephone Encounter (Signed)
CSW sent message to pt phone at [phone] to f/u on applications that pt had received for CAFA and [place] MedAssist as no further f/u from pt after our call on 12/13. Remain available to assist pt with these applications and any additional needs at this time.   Octavio Graves, MSW, LCSW [facility] Heart/Vascular Care Navigation  [phone]

## 2020-03-20 ENCOUNTER — Encounter: Payer: Self-pay | Admitting: Internal Medicine

## 2020-03-20 ENCOUNTER — Ambulatory Visit (INDEPENDENT_AMBULATORY_CARE_PROVIDER_SITE_OTHER): Payer: Medicaid Other | Admitting: Internal Medicine

## 2020-03-20 ENCOUNTER — Other Ambulatory Visit: Payer: Self-pay | Admitting: Internal Medicine

## 2020-03-20 ENCOUNTER — Other Ambulatory Visit: Payer: Self-pay

## 2020-03-20 VITALS — BP 142/84 | HR 73 | Ht 69.0 in | Wt 193.6 lb

## 2020-03-20 DIAGNOSIS — E782 Mixed hyperlipidemia: Secondary | ICD-10-CM

## 2020-03-20 DIAGNOSIS — Z951 Presence of aortocoronary bypass graft: Secondary | ICD-10-CM

## 2020-03-20 DIAGNOSIS — I739 Peripheral vascular disease, unspecified: Secondary | ICD-10-CM

## 2020-03-20 MED ORDER — ATORVASTATIN CALCIUM 80 MG PO TABS: 80.0000 mg | ORAL_TABLET | Freq: Every day | ORAL | 3 refills | Status: DC

## 2020-03-20 MED ORDER — CLOPIDOGREL BISULFATE 75 MG PO TABS: 75.0000 mg | ORAL_TABLET | Freq: Every day | ORAL | 3 refills | Status: DC

## 2020-03-20 MED ORDER — METOPROLOL TARTRATE 25 MG PO TABS: 25.0000 mg | ORAL_TABLET | Freq: Two times a day (BID) | ORAL | 3 refills | Status: DC

## 2020-03-20 MED FILL — ATORVASTATIN CALCIUM 80 MG: 80 | 30 days supply | Qty: 30 | Fill #0

## 2020-03-20 MED FILL — METOPROLOL TARTRATE 25 MG T: 25 | 30 days supply | Qty: 60 | Fill #0

## 2020-03-20 MED FILL — CLOPIDOGREL 75 MG TABLET: 75 | 30 days supply | Qty: 30 | Fill #0

## 2020-03-20 NOTE — Progress Notes (Signed)
Heart and Vascular Care Navigation  03/20/2020  ERIKA HUSSAR 1970/04/01 [id number]  Reason for Referral:  financial concerns, Medicaid Family Planning only, medication concerns.                                                                                                    Assessment:   LCSW familiar w/ pt from previous outreach. Referred again during appointment by Dr. Debara Pickett and Eliezer Lofts, RN. Pt currently out of medications, in need of assistance information. LCSW met with pt- reintroduced self, role, reason for visit. I reconfirmed pt home address, PCP and contact information. I let him know that I had attempted to reach him but been unsuccessful earlier this year. Pt still living w/ significant other Katharine Look. He is still unemployed, has a hearing for his disability appeal coming up. Has not reapplied for full Medicaid since his denial. I shared that there may be additional resources available for assistance while he waits for that disability appeal process as previously provided.  Pt provided w/ assistance applications again (CAFA, [employer], [place] Medassist - I went over in detail which documents pt needed to gather prior to submission. I shared that pt should take them home and begin to complete them today with the information he can (demographics, income information etc), and have Katharine Look complete the statements of support.               HRT/VAS Care Coordination    Patients Home Cardiology Office [place] Team Social Worker   Social Worker Name: Westley Hummer, LCSW, [place] arrangements for the past 2 months Single Family Home   Lives with: Significant Other   Patient Current Insurance Coverage Self-Pay  Medicaid Family Planning only   Patient Has Concern With [place] Yes   Patient Concerns With Medical Bills outstanding medical bills;  ongoing patient care needs   Medical Bill Referrals: Provided CAFA and Orange Card  applications   Does Patient Have Prescription Coverage? No   Patient Prescription Assistance Programs Heart Failure Fund; [place] Medassist; Other   [place] Medications will have medications filled at [facility] through [employer] and has been advised to complete [place] Medassist application   [place] Medassist Medications Atorvastatin Calcium;  Clopidogrel Bisulfate;  Metoprolol Tartrate (application provided 0/2/40)   Other Assistance Programs Medications Given aspirin supply from sample closet;  Famotidine at [place] [employer] None      Social History:                                                                             SDOH Screenings   Alcohol Screen: Not on file  Depression (PHQ2-9): Low Risk   . PHQ-2 Score: 4  Financial Resource  Strain: High Risk  . Difficulty of Paying Living Expenses: Very hard  Food Insecurity: No Food Insecurity  . Worried About Charity fundraiser in the Last Year: Never true  . Ran Out of Food in the Last Year: Never true  Housing: Low Risk   . Last Housing Risk Score: 0  Physical Activity: Not on file  Social Connections: Not on file  Stress: Not on file  Tobacco Use: High Risk  . Smoking Tobacco Use: Current Some Day Smoker  . Smokeless Tobacco Use: Former Soil scientist Needs: No Transportation Needs  . Lack of Transportation (Medical): No  . Lack of Transportation (Non-Medical): No    SDOH Interventions: Financial Resources:  Financial Strain Interventions: Other (Comment) (CAFA; [employer]; [employer] provided)  Food Insecurity:  Food Insecurity Interventions: Other (Comment) (Pt receives SNAP, application resubmitted to continue benefits- encouraged pt to call again to check status this week.)  Housing Insecurity:  Housing Interventions: Intervention Not Indicated  Transportation:   Transportation Interventions: Intervention Not Indicated   Other Care Navigation Interventions:      Provided Pharmacy assistance resources Heart Failure Fund,[place] Medassist,Other   Follow-up plan:   Pt and this Probation officer plan to speak Monday after 3:30pm to schedule a time to review applications again in person and submit if able. Lennox Pippins, RN assistance w/ sending scripts to [place]. Pt aware once I have confirmed that medications are filled our team can pay for them and will let him know he can pick them up. Pt given pharmacy information on his AVS. Pt was also provided w/ my card and encouraged to call if any questions prior to our call next week.
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
## 2020-03-20 NOTE — Patient Instructions (Signed)
Medication Instructions:  RESTART medications as prescribed  *If you need a refill on your cardiac medications before your next appointment, please call your pharmacy*  Follow-Up: At [facility], you and your health needs are our priority.  As part of our continuing mission to provide you with exceptional heart care, we have created designated Provider Care Teams.  These Care Teams include your primary Cardiologist (physician) and Advanced Practice Providers (APPs -  Physician Assistants and Nurse Practitioners) who all work together to provide you with the care you need, when you need it.  We recommend signing up for the patient portal called "MyChart".  Sign up information is provided on this After Visit Summary.  MyChart is used to connect with patients for Virtual Visits (Telemedicine).  Patients are able to view lab/test results, encounter notes, upcoming appointments, etc.  Non-urgent messages can be sent to your provider as well.   To learn more about what you can do with MyChart, go to [email or web address].    Your next appointment:   AS NEEDED with Dr. Rennis Golden   Continue routine cardiology care with Dr. Allyson Sabal

## 2020-03-21 ENCOUNTER — Telehealth: Payer: Self-pay | Admitting: Licensed Clinical Social Worker

## 2020-03-21 NOTE — Telephone Encounter (Signed)
LCSW called [facility] pharmacy. Pt has not yet picked up medications, had not responded to my message that they were ready either.   LCSW called pt at [phone] and attempted to reach pt. Message left informing, provided my number for call back as needed. Pt then texted this writer's work phone acknowledging that he received the message.   Octavio Graves, MSW, LCSW [facility] Health Heart/Vascular Care Navigation  [phone]

## 2020-03-26 ENCOUNTER — Telehealth: Payer: Self-pay | Admitting: Licensed Clinical Social Worker

## 2020-03-26 NOTE — Telephone Encounter (Signed)
LCSW spoke w/ pt this morning at [phone]. Pt will start applications and bring them to go over with LCSW this week- he tentatively can stop by the office on 3/10 (Thursday) at 3:30pm. Applications provided were CAFA, [employer] and [place] MedAssist.   Octavio Graves, MSW, LCSW [facility] Health Heart/Vascular Care Navigation  [phone]

## 2020-03-28 ENCOUNTER — Telehealth: Payer: Self-pay | Admitting: Licensed Clinical Social Worker
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
## 2020-04-01 ENCOUNTER — Telehealth: Payer: Self-pay | Admitting: Licensed Clinical Social Worker

## 2020-04-01 NOTE — Telephone Encounter (Signed)
Pt and this Clinical research associate had discussed meeting today at pt request after 3:00pm. Pt contacted this writer at 3:30pm and shared he does not feel good and will not be coming today.   Will be able to assist pt with assistance applications provided when pt able to complete/gather needed documents.   Octavio Graves, MSW, LCSW [facility] Heart/Vascular Care Navigation  [phone]

## 2020-04-02 ENCOUNTER — Telehealth: Payer: Self-pay | Admitting: Licensed Clinical Social Worker

## 2020-04-02 NOTE — Progress Notes (Signed)
Heart and Vascular Care Navigation  04/02/2020  [name] [name] [birth year] [id number]  Reason for Referral:  F/u for [facility], CAFA, [employer]ist                                                                                                  Assessment:    LCSW met with pt in the office briefly this afternoon. He has brought with him the [employer] and [employer] application which was almost completely completed. We completed missing parts as needed. Pt shares that they are still working on the form to be notarized for letter of support. Additional supporting document copies were made and originals along w/ copies of applications given back to the patient.                                    HRT/VAS Care Coordination    Patients Home Cardiology Office [place] Team Social Worker   Social Worker Name: Westley Hummer, LCSW, [place] arrangements for the past 2 months Single Family Home   Lives with: Significant Other   Patient Current Insurance Coverage Self-Pay  Medicaid Family Planning only   Patient Has Concern With [place] Yes   Patient Concerns With Medical Bills outstanding medical bills;  ongoing patient care needs   Medical Bill Referrals: Provided CAFA and Orange Card applications   Does Patient Have Prescription Coverage? No   Patient Prescription Assistance Programs Heart Failure Fund; [place] Medassist; Other   [place] Medications will have medications filled at [facility] through [place] and has been advised to complete [place] Medassist application   [place] Medassist Medications Atorvastatin Calcium;  Clopidogrel Bisulfate;  Metoprolol Tartrate (application provided 05/22/25)   Other Assistance Programs Medications Given aspirin supply from sample closet;  Famotidine at [place] Devices/Equipment None      Social History:                                                                              SDOH Screenings   Alcohol Screen: Not on file  Depression (PHQ2-9): Low Risk   . PHQ-2 Score: 4  Financial Resource Strain: High Risk  . Difficulty of Paying Living Expenses: Very hard  Food Insecurity: No Food Insecurity  . Worried About Charity fundraiser in the Last Year: Never true  . Ran Out of Food in the Last Year: Never true  Housing: Low Risk   . Last Housing Risk Score: 0  Physical Activity: Not on file  Social Connections: Not on file  Stress: Not on file  Tobacco Use: High Risk  . Smoking Tobacco Use: Current Some Day  Smoker  . Smokeless Tobacco Use: Former Soil scientist Needs: No Transportation Needs  . Lack of Transportation (Medical): No  . Lack of Transportation (Non-Medical): No    SDOH Interventions: Financial Resources:  Financial Strain Interventions: Other (Comment) ([place] MedAssist; [employer]; CAFA)     Follow-up plan:   LCSW has securely sent in [facility] MedAssist application along w/ supporting documentation. LCSW will assist with having scripts sent if pt approved. Additional documents needed once obtained will be sent with completed application to CAFA and [employer].  Pt has my number for additional questions/concerns.

## 2020-04-02 NOTE — Telephone Encounter (Signed)
Patient called; states he will bring by patient assistance applications this afternoon. I will review with pt once he has arrived.   [name] [name], MSW, LCSW [facility] Health Heart/Vascular Care Navigation  [phone]

## 2020-04-04 ENCOUNTER — Telehealth: Payer: Self-pay | Admitting: Licensed Clinical Social Worker

## 2020-04-04 MED ORDER — CLOPIDOGREL BISULFATE 75 MG PO TABS: 75.0000 mg | ORAL_TABLET | Freq: Every day | ORAL | 3 refills | Status: DC

## 2020-04-04 MED ORDER — METOPROLOL TARTRATE 25 MG PO TABS: 25.0000 mg | ORAL_TABLET | Freq: Two times a day (BID) | ORAL | 3 refills | Status: DC

## 2020-04-04 MED ORDER — ATORVASTATIN CALCIUM 80 MG PO TABS: 80.0000 mg | ORAL_TABLET | Freq: Every day | ORAL | 3 refills | Status: DC

## 2020-04-04 NOTE — Telephone Encounter (Signed)
Pt approved for [employer]ed[employer]ist, will be mailed medications for free through 03/19/2021. Pt sent text updating him with this information. Appreciate Herb Grays., RN, assistance w/ sending scripts to appropriate MedAssist Pharmacy.   [name] [name], MSW, LCSW [facility] Health Heart/Vascular Care Navigation  [phone]

## 2020-04-16 DIAGNOSIS — I739 Peripheral vascular disease, unspecified: Secondary | ICD-10-CM

## 2020-04-17 ENCOUNTER — Telehealth: Payer: Self-pay | Admitting: Licensed Clinical Social Worker

## 2020-04-17 NOTE — Telephone Encounter (Signed)
Text message sent to pt again to f/u and ensure pt has received his medications that are eligible to be sent to him through [facility]. I also had previously reached out to financial counselor who was able to reach out to food caseworker at DSS and confirm that pt does not currently have SNAP assistance.   [name] [name], MSW, LCSW [facility] Health Heart/Vascular Care Navigation  [phone]

## 2020-04-18 ENCOUNTER — Other Ambulatory Visit: Payer: Self-pay

## 2020-04-18 ENCOUNTER — Encounter: Payer: Self-pay | Admitting: Student

## 2020-04-18 ENCOUNTER — Ambulatory Visit (INDEPENDENT_AMBULATORY_CARE_PROVIDER_SITE_OTHER): Payer: Self-pay | Admitting: Student

## 2020-04-18 VITALS — BP 127/88 | HR 93 | Temp 97.9°F | Ht 69.0 in | Wt 194.3 lb

## 2020-04-18 DIAGNOSIS — I739 Peripheral vascular disease, unspecified: Secondary | ICD-10-CM

## 2020-04-18 DIAGNOSIS — I25810 Atherosclerosis of coronary artery bypass graft(s) without angina pectoris: Secondary | ICD-10-CM

## 2020-04-18 DIAGNOSIS — I10 Essential (primary) hypertension: Secondary | ICD-10-CM

## 2020-04-18 MED ORDER — LOSARTAN POTASSIUM 50 MG PO TABS: 50.0000 mg | ORAL_TABLET | Freq: Every day | ORAL | 11 refills | Status: DC

## 2020-04-18 MED ORDER — CARVEDILOL 3.125 MG PO TABS: 3.1250 mg | ORAL_TABLET | Freq: Two times a day (BID) | ORAL | 11 refills | Status: DC

## 2020-04-18 NOTE — Patient Instructions (Signed)
Mr. Zawistowski,  It is a pleasure seeing you today.  Here is a summary what we talked about  1.  Leg pain: The test that we did in the clinic shows that you have good blood flow to your legs.  Please try to get off your feet if your heels continue to hurt and ice as needed.  Please follow-up with Dr. Allyson Sabal.  2.  For your heart, I will send a message to Dr. Gery Pray office to make a follow-up appointment.  I will start a medication to help with your heart and blood pressure.  Please come back in 3 weeks for repeat blood work  Take care  Dr. Cyndie Chime

## 2020-04-18 NOTE — Assessment & Plan Note (Addendum)
Patient presents to clinic for bilateral lower extremity swelling and pain that started 1 week ago.  States the pain is from his knee down.  He states that he has to be on his feet this month for housework because his partner just got ankle surgery. Reports tingling sensation and pain of his bilat heels, endorses pain with walking. States that his legs feel cold sometimes. Denies chest pain or shortness of breath.   Assessment and plan His presentation was initially concerned for progression of his PAD given his claudication and diminished dorsalis pedis pulse. Patient has extensive history of PAD with multiple stents place in the past. ABI of bilateral LE was obtained and came back normal. I also used the doppler but still could not find the dorsalis pedis pulse. Very faint posterior tibial pulse detected with Doppler.   Even though his ABI are normal, I think that his legs pain are related to his PAD. Will work on risk reduction for his PAD. Continue DAPT and Atorvastatin. Patient was seen by Dr. Rennis Golden recently but was not started on new medication because the lack of coverage. Patient just recently joined the [facility] Medassist. Will check lipid panel today.   Patient states that he is cutting back on the smoking. He is smoking 1/2 pack a day. He has tried Chantix, patch and gum in the past. States that his barriers are family stress.   For his heels pain, unsure if this is plantar fasciitis. Advised patient to try to be off his feet and wear cushioned shoes. He can also use ice, and take Tylenol/Ibuprofen as needed.   -Advised patient to follow up with Dr. Allyson Sabal soon. Will send a message to Dr. Allyson Sabal office -Check lipid panel today. Continue Atorvastatin 80 mg -Continue DAPT -Add Losartan to optimize BP and HF -Encourage patient on smoking cessation
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
## 2020-04-18 NOTE — Assessment & Plan Note (Addendum)
Echocardiogram on 04/2019 showed new reduction of EF 45-50% with septal apical and inferior Klosinski hypokinesis. Patient denies chest pain or shortness of breath. He appears euvolemic on exam.   - Will reach out to Dr. Allyson Sabal for follow up appointment and recommendations.  -Add Losartan 50 mg -Change Lopressor to Coreg 3.125 mg BID for mortality benefit -BMP in 3 weeks  Addendum I called and spoke to the patient about his BMP.  Creatinine function stable.  Patient however states that he has not been taking any of his blood pressure medications that were prescribed.  He wants to wait until seeing Dr. Allyson Sabal on 05/17/20 before starting these medication.  Will follow-up with Dr. Hazle Coca notes.

## 2020-04-19 ENCOUNTER — Telehealth: Payer: Self-pay | Admitting: Cardiovascular Disease

## 2020-04-19 LAB — LIPID PANEL
Chol/HDL Ratio: 8.4 ratio — ABNORMAL HIGH (ref 0.0–5.0)
Cholesterol, Total: 227 mg/dL — ABNORMAL HIGH (ref 100–199)
HDL: 27 mg/dL — ABNORMAL LOW (ref >39)
LDL Chol Calc (NIH): 146 mg/dL — ABNORMAL HIGH (ref 0–99)
Triglycerides: 291 mg/dL — ABNORMAL HIGH (ref 0–149)
VLDL Cholesterol Cal: 54 mg/dL — ABNORMAL HIGH (ref 5–40)

## 2020-04-19 LAB — BMP8+ANION GAP
Anion Gap: 17 mmol/L (ref 10.0–18.0)
BUN/Creatinine Ratio: 10 (ref 9–20)
BUN: 12 mg/dL (ref 6–24)
CO2: 21 mmol/L (ref 20–29)
Calcium: 9.5 mg/dL (ref 8.7–10.2)
Chloride: 101 mmol/L (ref 96–106)
Creatinine, Ser: 1.17 mg/dL (ref 0.76–1.27)
Glucose: 86 mg/dL (ref 65–99)
Potassium: 4.1 mmol/L (ref 3.5–5.2)
Sodium: 139 mmol/L (ref 134–144)
eGFR: 76 mL/min/{1.73_m2} (ref >59)

## 2020-04-19 NOTE — Telephone Encounter (Signed)
Returned call to pt he states that PCP wanted to change his metoprolol to carvedilol and start him on losartan. Pt thinks that he does not need to start the Losartan "because 1 high blood pressure does not mean that he has high BP" he does not want to start. He will change to carvedilol but will not start the losartan. He will take and log his blood pressure for Dr Hazle Coca review.

## 2020-04-19 NOTE — Telephone Encounter (Signed)
Spoke with patient to schedule LEA's ABI"s and Aorta Iliac dopplers per Dr. Marcille Blanco Wednesday 05/01/20 beginning at 8:00am--moved Carotid doppler from 06/07/20 to 05/01/20 as well day-follow up with Dr. Allyson Sabal scheduled 05/17/20 at 3:15 pm.  Patient wants to speak with the nurse regarding his medications.  His PCP made some changes yesterday and he needs to let us know.

## 2020-04-19 NOTE — Progress Notes (Signed)
Internal Medicine Clinic Attending  Case discussed with Dr. Cyndie Chime  At the time of the visit.  We reviewed the resident's history and exam and pertinent patient test results.  I agree with the assessment, diagnosis, and plan of care documented in the resident's note.   Patient here with likely claudication symptoms in the setting of PVD s/p revascularization and being off of his antiplatelet and statin medication. Atorvastatin and plavix were restarted, refills sent to his pharmacy. ABIs in clinic today are normal. He was instructed to follow up with Dr. Allyson Sabal.

## 2020-04-19 NOTE — Progress Notes (Signed)
He just got new refills this month with [place] [facility].  I sent in losartan and Coreg, replacing his metoprolol.   Thank you

## 2020-04-20 NOTE — Telephone Encounter (Signed)
That works for me

## 2020-05-01 ENCOUNTER — Ambulatory Visit (HOSPITAL_COMMUNITY)
Admission: RE | Admit: 2020-05-01 | Discharge: 2020-05-01 | Disposition: A | Discharge status: Home / Self Care | Payer: Medicaid Other | Source: Ambulatory Visit | Attending: Cardiology | Admitting: Cardiology

## 2020-05-01 ENCOUNTER — Other Ambulatory Visit (HOSPITAL_COMMUNITY): Payer: Self-pay | Admitting: Cardiovascular Disease

## 2020-05-01 ENCOUNTER — Ambulatory Visit (HOSPITAL_COMMUNITY)
Admission: RE | Admit: 2020-05-01 | Discharge: 2020-05-01 | Disposition: A | Discharge status: Home / Self Care | Payer: Self-pay | Source: Ambulatory Visit | Attending: Cardiology | Admitting: Cardiology

## 2020-05-01 ENCOUNTER — Other Ambulatory Visit: Payer: Self-pay

## 2020-05-01 DIAGNOSIS — I739 Peripheral vascular disease, unspecified: Secondary | ICD-10-CM | POA: Insufficient documentation

## 2020-05-01 DIAGNOSIS — I6523 Occlusion and stenosis of bilateral carotid arteries: Secondary | ICD-10-CM

## 2020-05-01 DIAGNOSIS — Z95828 Presence of other vascular implants and grafts: Secondary | ICD-10-CM | POA: Insufficient documentation

## 2020-05-06 ENCOUNTER — Telehealth: Payer: Self-pay | Admitting: Licensed Clinical Social Worker

## 2020-05-06 NOTE — Telephone Encounter (Signed)
Mailed pt reminder of needed documents for assistance applications. Also included extra copies should pt need those. Will f/u prior to appointment on 4/29 to ensure pt brings documents to appointment if still interested.   Octavio Graves, MSW, LCSW [facility] Heart/Vascular Care Navigation  [phone]

## 2020-05-08 NOTE — Addendum Note (Signed)
Addended byDoran Stabler on: 05/08/2020 05:01 PM   Modules accepted: Orders

## 2020-05-09 ENCOUNTER — Other Ambulatory Visit: Payer: Self-pay

## 2020-05-09 DIAGNOSIS — I10 Essential (primary) hypertension: Secondary | ICD-10-CM

## 2020-05-10 LAB — BMP8+ANION GAP
Anion Gap: 19 mmol/L — ABNORMAL HIGH (ref 10.0–18.0)
BUN/Creatinine Ratio: 9 (ref 9–20)
BUN: 10 mg/dL (ref 6–24)
CO2: 23 mmol/L (ref 20–29)
Calcium: 8.9 mg/dL (ref 8.7–10.2)
Chloride: 98 mmol/L (ref 96–106)
Creatinine, Ser: 1.13 mg/dL (ref 0.76–1.27)
Glucose: 112 mg/dL — ABNORMAL HIGH (ref 65–99)
Potassium: 4.4 mmol/L (ref 3.5–5.2)
Sodium: 140 mmol/L (ref 134–144)
eGFR: 80 mL/min/{1.73_m2} (ref >59)

## 2020-05-17 ENCOUNTER — Other Ambulatory Visit: Payer: Self-pay

## 2020-05-17 ENCOUNTER — Ambulatory Visit (INDEPENDENT_AMBULATORY_CARE_PROVIDER_SITE_OTHER): Payer: Self-pay | Admitting: Cardiovascular Disease

## 2020-05-17 ENCOUNTER — Encounter: Payer: Self-pay | Admitting: Cardiovascular Disease

## 2020-05-17 VITALS — BP 137/77 | HR 68 | Ht 69.0 in | Wt 191.2 lb

## 2020-05-17 DIAGNOSIS — I10 Essential (primary) hypertension: Secondary | ICD-10-CM

## 2020-05-17 DIAGNOSIS — Z951 Presence of aortocoronary bypass graft: Secondary | ICD-10-CM

## 2020-05-17 DIAGNOSIS — I779 Disorder of arteries and arterioles, unspecified: Secondary | ICD-10-CM | POA: Insufficient documentation

## 2020-05-17 DIAGNOSIS — I739 Peripheral vascular disease, unspecified: Secondary | ICD-10-CM

## 2020-05-17 DIAGNOSIS — Z95828 Presence of other vascular implants and grafts: Secondary | ICD-10-CM

## 2020-05-17 DIAGNOSIS — I6523 Occlusion and stenosis of bilateral carotid arteries: Secondary | ICD-10-CM

## 2020-05-17 DIAGNOSIS — E782 Mixed hyperlipidemia: Secondary | ICD-10-CM

## 2020-05-17 NOTE — Progress Notes (Signed)
05/17/2020 [name] [name]   07/02/1970  [id number]  Primary Physician Doran Stabler, DO Primary Cardiologist: [name] [name]  HPI:  [name] [name] is a 50 y.o. engaged Caucasian male (fiancs name is Dois Davenport) father of one 76 year old son who is referred by [name] registered ,nurse practitioner, for peripheral vascular evaluation. Ilastsaw him in the 05/19/2019. He has a history of acute tobacco abuse having smoked 40-60 pack years, treated hypertension and hyperlipidemia. She has never had a heart attack or stroke and denies chest pain or shortness of breath. He saw [name] years ago and ultimately underwent angiography/ 05/16/13 revealing bilateral iliac disease both of which were stented with balloon expandable and self expanding stents. He enjoyed symptomatic relief for approximately one year and then developed recurrent left calf claudication. Recent Dopplers performed 05/02/15 revealed a left ABI 0.78 with a high-frequency signal in his left external iliac artery. I performed lower extremity angiography on him 06/06/15 revealing a patent left common and external iliac artery stent with a 95% stenosis at the distal edge of the proximal stent which I restented with a 9 mm x 3 cm nitinol self expanding stent. His symptoms of claudication have resolved and his Dopplers have normalized. He has in addition stop smoking by his account. He had a left main stent placed in [facility] August of last year and underwent repeat cardiac catheterization by Dr. 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 He underwent coronary artery bypass grafting x3 by Dr. [name] 04/07/2017 with a [name] to the LAD and vein to [name] and 2. He is done well since. He has stopped smoking.  I performed peripheral angiography and intervention on him 11/07/2017 with stenting of his right common iliac artery with a 7 mm x 29 mm long balloon  expandable stent. The previous stent I placed in his left leg were widely patent angiographically. His follow-up Doppler studies normalized and his claudication has resolved.  He had developed progressive right lower extremity claudication with Doppler studies performed 05/12/2019 revealing a decline in his right ABI to 0.7 with high-frequency signals in his right common and external iliac artery suggesting progression of disease and/or in-stent restenosis. He does continue to smoke. Unfortunately, he was out of all of his medicines for 8 months up until recently when he restarted those. He did have a 2D echo performed 05/09/2019 revealing slightly lower EF than in the past in the 45 to 50% range with an inferoapical Grisby motion abnormality.  I performed peripheral angiography on him 05/29/2019 revealing high-grade disease in his right common extra iliac artery both of which were restented.  He had a VBX stent placed in his right common iliac artery and a nitinol self-expanding stent in his external iliac artery.  His right ABI increased from 0.7 up to 0.98.  His claudication has resolved.  Since I saw him a year ago he has started smoking again and currently smoking a pack a day.  He denies chest pain or shortness of breath.  He had not taken his statin drugs up until recently and lipid profile performed 04/18/2020 revealed an LDL of 146.   Current Meds  Medication Sig  . aspirin EC 81 MG tablet Take 1 tablet (81 mg total) by mouth daily.  [name] atorvastatin (LIPITOR) 80 MG tablet Take 1 tablet (80 mg total) by mouth at bedtime.  . carvedilol (COREG) 3.125 MG tablet Take 1 tablet (  3.125 mg total) by mouth 2 (two) times daily.  . clopidogrel (PLAVIX) 75 MG tablet Take 1 tablet (75 mg total) by mouth daily.  [name] losartan (COZAAR) 50 MG tablet Take 1 tablet (50 mg total) by mouth daily.   Current Facility-Administered Medications for the 05/17/20 encounter (Office Visit) with Runell Gess, MD   Medication  . sodium chloride flush (NS) 0.9 % injection 3 mL     Allergies  Allergen Reactions  . Codeine Other (See Comments)    Migraine     Social History   Socioeconomic History  . Marital status: Divorced    Spouse name: Not on file  . Number of children: Not on file  . Years of education: Not on file  . Highest education level: Not on file  Occupational History  . Occupation: Truck [employer] doctor  Tobacco Use  . Smoking status: Current Some Day Smoker    Packs/day: 1.00    Years: 30.00    Pack years: 30.00    Types: Cigarettes    Last attempt to quit: 06/07/2015    Years since quitting: 4.9  . Smokeless tobacco: Former Neurosurgeon    Types: Snuff  . Tobacco comment: "used snuff in my teens"  Vaping Use  . Vaping Use: Former  Substance and Sexual Activity  . Alcohol use: Yes    Alcohol/week: 0.0 standard drinks    Comment: 06/06/2015 "might have 1 beer/month"  . Drug use: No  . Sexual activity: Not Currently  Other Topics Concern  . Not on file  Social History Narrative  . Not on file   Social Determinants of Health   Financial Resource Strain: High Risk  . Difficulty of Paying Living Expenses: Very hard  Food Insecurity: No Food Insecurity  . Worried About Programme researcher, broadcasting/film/video in the Last Year: Never true  . Ran Out of Food in the Last Year: Never true  Transportation Needs: No Transportation Needs  . Lack of Transportation (Medical): No  . Lack of Transportation (Non-Medical): No  Physical Activity: Not on file  Stress: Not on file  Social Connections: Not on file  Intimate Partner Violence: Not on file     Review of Systems: General: negative for chills, fever, night sweats or weight changes.  Cardiovascular: negative for chest pain, dyspnea on exertion, edema, orthopnea, palpitations, paroxysmal nocturnal dyspnea or shortness of breath Dermatological: negative for rash Respiratory: negative for cough or wheezing Urologic: negative for hematuria Abdominal:  negative for nausea, vomiting, diarrhea, bright red blood per rectum, melena, or hematemesis Neurologic: negative for visual changes, syncope, or dizziness All other systems reviewed and are otherwise negative except as noted above.    Blood pressure 137/77, pulse 68, height 5\' 9"  (1.753 m), weight 191 lb 3.2 oz (86.7 kg), SpO2 98 %.  General appearance: alert and no distress Neck: no adenopathy, no carotid bruit, no JVD, supple, symmetrical, trachea midline and thyroid not enlarged, symmetric, no tenderness/mass/nodules Lungs: clear to auscultation bilaterally Heart: regular rate and rhythm, S1, S2 normal, no murmur, click, rub or gallop Extremities: extremities normal, atraumatic, no cyanosis or edema Pulses: 2+ and symmetric Skin: Skin color, texture, turgor normal. No rashes or lesions Neurologic: Alert and oriented X 3, normal strength and tone. Normal symmetric reflexes. Normal coordination and gait  EKG sinus rhythm at 68 without ST or T wave changes.  I personally reviewed this EKG.  ASSESSMENT AND PLAN:   Nicotine dependence History of ongoing tobacco abuse 1 pack/day recalcitrant to risk  factor modification.  Claudication in peripheral vascular disease (HCC) History of peripheral arterial disease status post bilateral iliac stenting back in 2015.  His most recent peripheral intervention was 05/28/2017 at which time I intervened on his right common and external iliac artery with VBX covered stents.  His left iliac system was patent.  His right ABI increased from 0.7 up to 0.98.  His claudication resolved.  His most recent Doppler studies performed 05/01/2020 revealed normal ABIs with a high-frequency signal in his left iliac stent although he denies claudication.  HLD (hyperlipidemia) History of hyperlipidemia on high-dose atorvastatin which he has been taking up until recently.  His most recent lipid profile performed 04/18/2020 revealed total cholesterol of 227, LDL of 146 and HDL  27.  We will repeat a lipid liver profile in 2 months.  Essential hypertension History of essential hypertension blood pressure measured today at 137/77.  He is on carvedilol.  S/P CABG x 3 History of CAD status post cardiac catheterization performed by Dr. [name] in the setting of unstable angina 04/09/2017 which ultimately led to CABG x3 by Dr. [name] 04/07/2017 with a [name] to his LAD, vein to OM1 and OM2   He denies chest pain  Carotid artery disease (HCC) Carotid artery disease history of bilateral moderate ICA stenosis by duplex ultrasound 05/01/2020.  This will be repeated on      [name] [facility], [facility] 05/17/2020 3:39 PM

## 2020-05-17 NOTE — Patient Instructions (Signed)
Medication Instructions:  Your physician recommends that you continue on your current medications as directed. Please refer to the Current Medication list given to you today.  *If you need a refill on your cardiac medications before your next appointment, please call your pharmacy*   Lab Work: Your physician recommends that you return for lab work in: 2 months for fasting lipid/liver profile.  If you have labs (blood work) drawn today and your tests are completely normal, you will receive your results only by: [name] MyChart Message (if you have MyChart) OR . A paper copy in the mail If you have any lab test that is abnormal or we need to change your treatment, we will call you to review the results.   Testing/Procedures: Your physician has requested that you have a lower extremity arterial duplex. This test is an ultrasound of the arteries in the legs. It looks at arterial blood flow in the legs. Allow one hour for Lower Arterial scans. There are no restrictions or special instructions  Your physician has requested that you have an ankle brachial index (ABI). During this test an ultrasound and blood pressure cuff are used to evaluate the arteries that supply the arms and legs with blood. Allow thirty minutes for this exam. There are no restrictions or special instructions.  Your physician has requested that you have a carotid duplex. This test is an ultrasound of the carotid arteries in your neck. It looks at blood flow through these arteries that supply the brain with blood. Allow one hour for this exam. There are no restrictions or special instructions.  Dr. Allyson Sabal has recommended that you have an Ultrasound of your AORTA/IVC/ILIACS.   To prepare for this test:  . No food after 11PM the night before. Water is OK. (Don't drink liquids if you have been instructed not to for ANOTHER test).  . Avoid foods that produce bowel gas, for 24 hours prior to exam (see below). . No breakfast, no chewing  gum, no smoking or carbonated beverages. . Patient may take morning medications with water. . Come in for test at least 15 minutes early to register.  These procedures are done at [address] [facility]. 2nd Floor. To be done in April 2023.  Follow-Up: At [facility], you and your health needs are our priority.  As part of our continuing mission to provide you with exceptional heart care, we have created designated Provider Care Teams.  These Care Teams include your primary Cardiologist (physician) and Advanced Practice Providers (APPs -  Physician Assistants and Nurse Practitioners) who all work together to provide you with the care you need, when you need it.  We recommend signing up for the patient portal called "MyChart".  Sign up information is provided on this After Visit Summary.  MyChart is used to connect with patients for Virtual Visits (Telemedicine).  Patients are able to view lab/test results, encounter notes, upcoming appointments, etc.  Non-urgent messages can be sent to your provider as well.   To learn more about what you can do with MyChart, go to [email or web address].    Your next appointment:   12 month(s)  The format for your next appointment:   In Person  Provider:   Nanetta Batty, MD

## 2020-05-17 NOTE — Assessment & Plan Note (Signed)
History of essential hypertension blood pressure measured today at 137/77.  He is on carvedilol.

## 2020-05-17 NOTE — Assessment & Plan Note (Signed)
History of ongoing tobacco abuse 1 pack/day recalcitrant to risk factor modification. 

## 2020-05-17 NOTE — Assessment & Plan Note (Signed)
History of hyperlipidemia on high-dose atorvastatin which he has been taking up until recently.  His most recent lipid profile performed 04/18/2020 revealed total cholesterol of 227, LDL of 146 and HDL 27.  We will repeat a lipid liver profile in 2 months.

## 2020-05-17 NOTE — Assessment & Plan Note (Addendum)
History of CAD status post cardiac catheterization performed by Dr. [name] in the setting of unstable angina 04/09/2017 which ultimately led to CABG x3 by Dr. [name] 04/07/2017 with a [name] to his LAD, vein to OM1 and OM2   He denies chest pain

## 2020-05-17 NOTE — Assessment & Plan Note (Signed)
Carotid artery disease history of bilateral moderate ICA stenosis by duplex ultrasound 05/01/2020.  This will be repeated on

## 2020-05-17 NOTE — Assessment & Plan Note (Signed)
History of peripheral arterial disease status post bilateral iliac stenting back in 2015.  His most recent peripheral intervention was 05/28/2017 at which time I intervened on his right common and external iliac artery with VBX covered stents.  His left iliac system was patent.  His right ABI increased from 0.7 up to 0.98.  His claudication resolved.  His most recent Doppler studies performed 05/01/2020 revealed normal ABIs with a high-frequency signal in his left iliac stent although he denies claudication.

## 2020-06-07 ENCOUNTER — Encounter (HOSPITAL_COMMUNITY): Payer: Medicaid Other

## 2020-07-23 ENCOUNTER — Encounter: Payer: Self-pay | Admitting: *Deleted

## 2020-08-19 ENCOUNTER — Other Ambulatory Visit: Payer: Self-pay

## 2020-08-19 MED FILL — Clopidogrel Bisulfate Tab 75 MG (Base Equiv): ORAL | 30 days supply | Qty: 30 | Fill #0 | Status: AC

## 2020-08-20 ENCOUNTER — Telehealth: Payer: Self-pay | Admitting: Licensed Clinical Social Worker

## 2020-08-20 NOTE — Telephone Encounter (Signed)
LCSW received request from RN to f/u with pt regarding his medications. Currently pt has been enrolled in [facility] which carries all of his current medications. This Clinical research associate is familiar with pt from previous assistance, pt never responded to call/text/mailed reminders in March and April of this year to complete his [employer].   LCSW attempted to reach pt at [phone], no answer, left voicemail requesting call back if pt still in need of assistance.   Octavio Graves, MSW, LCSW [facility] Heart/Vascular Care Navigation  [phone]

## 2020-08-21 ENCOUNTER — Telehealth: Payer: Self-pay | Admitting: Licensed Clinical Social Worker

## 2020-08-21 ENCOUNTER — Telehealth: Payer: Self-pay | Admitting: *Deleted

## 2020-08-21 NOTE — Telephone Encounter (Signed)
  August 21, 2020  Runell Gess, MD to Me      1:15 PM Dopplers performed in April showed patent iliac stents.  I suspect that we had ordered repeat Dopplers a year from then.  Unless he is having progressivesymptoms I do not think we need to order Dopplers any soon  Advised patient, verbalized understanding

## 2020-08-21 NOTE — Telephone Encounter (Signed)
LCSW was able to connect w/ pt via telephone today [phone]).  Re-introduced self, role, reason for call. Pt confirms he was able to get his medications sorted and reordered through [facility] and that he got an email stating they were on their way. Pt shares that he often feels tired when he takes his medications, I encouraged him to f/u with provider next appointment to see if that is normal and to call nurse line if any additional questions.   Pt then asked this writer if I can see when his next dopplars are. Unfortunately I do not see anything on his upcoming appointments and there isn't a date documented from last appointment notes. LCSW advised pt I would inquire with nursing team and get back in touch with him. I spoke with Juliette Alcide, LPN, who will assist pt with identifying when next dopplars are ordered and shares she will call him back directly. LCSW appreciative of assistance, I have texted pt and let him know that he should expect a call. Remain available, pt shares that he has an upcoming hearing for his disability appeal on 8/15, I will f/u with him after that time.   [name] [name], MSW, LCSW [facility] Heart/Vascular Care Navigation  [phone]

## 2020-08-21 NOTE — Telephone Encounter (Signed)
Spoke with patient and he was asking when he was needing repeat dopplers Discussed with patient and he has been having issues with legs When he first gets up in am or from sitting 30-60 minutes he has cramping in calves along with stinging in feet. This has been going on for about 1 month. Resolves in less than 5 minutes  Does have intermittent swelling in ankles and feet Would like to know if Dr Allyson Sabal would like doppler wanted repeated sooner than April/his recommendations.  Will forward to Dr Allyson Sabal for review

## 2020-09-30 ENCOUNTER — Encounter: Payer: Self-pay | Admitting: Internal Medicine

## 2020-09-30 ENCOUNTER — Other Ambulatory Visit: Payer: Self-pay

## 2020-09-30 ENCOUNTER — Ambulatory Visit (INDEPENDENT_AMBULATORY_CARE_PROVIDER_SITE_OTHER): Payer: Self-pay | Admitting: Internal Medicine

## 2020-09-30 VITALS — BP 128/87 | HR 86 | Temp 98.1°F | Resp 24 | Ht 69.0 in | Wt 183.1 lb

## 2020-09-30 DIAGNOSIS — I739 Peripheral vascular disease, unspecified: Secondary | ICD-10-CM

## 2020-09-30 DIAGNOSIS — N529 Male erectile dysfunction, unspecified: Secondary | ICD-10-CM

## 2020-09-30 DIAGNOSIS — R221 Localized swelling, mass and lump, neck: Secondary | ICD-10-CM

## 2020-09-30 DIAGNOSIS — R7303 Prediabetes: Secondary | ICD-10-CM

## 2020-09-30 DIAGNOSIS — L729 Follicular cyst of the skin and subcutaneous tissue, unspecified: Secondary | ICD-10-CM

## 2020-09-30 HISTORY — DX: Follicular cyst of the skin and subcutaneous tissue, unspecified: L72.9

## 2020-09-30 LAB — POCT GLYCOSYLATED HEMOGLOBIN (HGB A1C): Hemoglobin A1C: 6.2 % — AB (ref 4.0–5.6)

## 2020-09-30 LAB — GLUCOSE, CAPILLARY: Glucose-Capillary: 101 mg/dL — ABNORMAL HIGH (ref 70–99)

## 2020-09-30 NOTE — Assessment & Plan Note (Deleted)
Patient presents today for evaluation of bilateral extremity pain this been going on for several months.  States he wakes up with severe stinging pain in his feet when he first gets out of bed and this necessitates him to stand for a few seconds before walking.  Once he starts walking states that he needs about a minute before the blood blood gets going and the pain resolves and he is able to ambulate.  He also starts having pain once he walks long distances.  Rest tends to improve his pain but it does take some time.  States that he saw Dr. Allyson Sabal and had Dopplers at the time which were unremarkable.  He also describes a shooting pain going from his legs to his knees and mid thigh regions.  Denies any chest pain, shortness of breath or swelling.  He states that he is continue to smoke about 13 cigarettes a day.  He has tried nicotine gum, patches and Chantix in the past.  States Chantix made him feel off and he does not want to take this medication again.  He was also off of his statin therapy for a while and recommended checking his cholesterol levels today however patient would like to do this with Dr. Allyson Sabal.  Plan: Discussed the importance of quitting tobacco and adhering to his medications to her event progression and worsening of his PAD and erectile dysfunction.  Discussed that since he had recent Dopplers that were normal I would recommend he follow-up with his vascular doctor.  -Continue atorvastatin 80 mg daily -Continue dual antiplatelet therapy -Encouraged tobacco cessation
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
## 2020-09-30 NOTE — Assessment & Plan Note (Signed)
Patient presents today for evaluation of bilateral extremity pain this been going on for several months.  States he wakes up with severe stinging pain in his feet when he first gets out of bed and this necessitates him to stand for a few seconds before walking.  Once he starts walking states that he needs about a minute before the blood blood gets going and the pain resolves and he is able to ambulate.  He also starts having pain once he walks long distances.  Rest tends to improve his pain but it does take some time.  States that he saw Dr. Berry and had Dopplers at the time which were unremarkable.  He also describes a shooting pain going from his legs to his knees and mid thigh regions.  Denies any chest pain, shortness of breath or swelling.  He states that he is continue to smoke about 13 cigarettes a day.  He has tried nicotine gum, patches and Chantix in the past.  States Chantix made him feel off and he does not want to take this medication again.  He was also off of his statin therapy for a while and recommended checking his cholesterol levels today however patient would like to do this with Dr. Berry.  Plan: Discussed the importance of quitting tobacco and adhering to his medications to her event progression and worsening of his PAD and erectile dysfunction.  Discussed that since he had recent Dopplers that were normal I would recommend he follow-up with his vascular doctor.  -Continue atorvastatin 80 mg daily -Continue dual antiplatelet therapy -Encouraged tobacco cessation 

## 2020-09-30 NOTE — Assessment & Plan Note (Signed)
Patient reports erectile dysfunction which he attributes to his PAD and smoking.  States that he still gets morning erections however they go away very quickly once he is awake.  His partner is concerned about his performance however he states that he is not interested in taking any medications at this time.  He is worried with how they will interact with his current medications.  He can still safely take medications for ED with his current medications however patient is not interested at this time.  He also mention decreased pressure to his urinary stream but states the stream itself stays steady.  He does not feel that he is completely voiding when he goes to the restroom.  Denies any nocturia or increased frequency though.  Assessment/plan: Erectile dysfunction secondary to PAD and smoking.  He may possibly have overlying components of BPH.  He is not interested in medications at this time but counseled on how to use these medications safely.  Informed patient if symptoms persist or worsen we can consider starting a medication for both the ED and BPH.

## 2020-09-30 NOTE — Assessment & Plan Note (Signed)
Recheck hemoglobin A1c today 

## 2020-09-30 NOTE — Patient Instructions (Signed)
I am checking your hemoglobin A1c (diabetic blood marker) today.  I will call you if the results are abnormal.  In the meantime I want you to go ahead and call Dr. Gery Pray and see if he can schedule a closer follow-up with him regarding your leg pain and to have your cholesterol levels rechecked.  Steps to Quit Smoking Smoking tobacco is the leading cause of preventable death. It can affect almost every organ in the body. Smoking puts you and those around you at risk for developing many serious chronic diseases. Quitting smoking can be difficult, but it is one of the best things that you can do for your health. It is never too late to quit. How do I get ready to quit? When you decide to quit smoking, create a plan to help you succeed. Before you quit: Pick a date to quit. Set a date within the next 2 weeks to give you time to prepare. Write down the reasons why you are quitting. Keep this list in places where you will see it often. Tell your family, friends, and co-workers that you are quitting. Support from your loved ones can make quitting easier. Talk with your health care provider about your options for quitting smoking. Find out what treatment options are covered by your health insurance. Identify people, places, things, and activities that make you want to smoke (triggers). Avoid them. What first steps can I take to quit smoking? Throw away all cigarettes at home, at work, and in your car. Throw away smoking accessories, such as Set designer. Clean your car. Make sure to empty the ashtray. Clean your home, including curtains and carpets. What strategies can I use to quit smoking? Talk with your health care provider about combining strategies, such as taking medicines while you are also receiving in-person counseling. Using these two strategies together makes you more likely to succeed in quitting than if you used either strategy on its own. If you are pregnant or breastfeeding, talk  with your health care provider about finding counseling or other support strategies to quit smoking. Do not take medicine to help you quit smoking unless your health care provider tells you to do so. To quit smoking: Quit right away Quit smoking completely, instead of gradually reducing how much you smoke over a period of time. Research shows that stopping smoking right away is more successful than gradually quitting. Attend in-person counseling to help you build problem-solving skills. You are more likely to succeed in quitting if you attend counseling sessions regularly. Even short sessions of 10 minutes can be effective. Take medicine You may take medicines to help you quit smoking. Some medicines require a prescription and some you can purchase over-the-counter. Medicines may have nicotine in them to replace the nicotine in cigarettes. Medicines may: Help to stop cravings. Help to relieve withdrawal symptoms. Your health care provider may recommend: Nicotine patches, gum, or lozenges. Nicotine inhalers or sprays. Non-nicotine medicine that is taken by mouth. Find resources Find resources and support systems that can help you to quit smoking and remain smoke-free after you quit. These resources are most helpful when you use them often. They include: Online chats with a Veterinary surgeon. Telephone quitlines. Printed Materials engineer. Support groups or group counseling. Text messaging programs. Mobile phone apps or applications. Use apps that can help you stick to your quit plan by providing reminders, tips, and encouragement. There are many free apps for mobile devices as well as websites. Examples include Quit Guide from  the CDC and smokefree.gov What things can I do to make it easier to quit?  Reach out to your family and friends for support and encouragement. Call telephone quitlines (1-800-QUIT-NOW), reach out to support groups, or work with a counselor for support. Ask people who smoke to  avoid smoking around you. Avoid places that trigger you to smoke, such as bars, parties, or smoke-break areas at work. Spend time with people who do not smoke. Lessen the stress in your life. Stress can be a smoking trigger for some people. To lessen stress, try: Exercising regularly. Doing deep-breathing exercises. Doing yoga. Meditating. Performing a body scan. This involves closing your eyes, scanning your body from head to toe, and noticing which parts of your body are particularly tense. Try to relax the muscles in those areas. How will I feel when I quit smoking? Day 1 to 3 weeks Within the first 24 hours of quitting smoking, you may start to feel withdrawal symptoms. These symptoms are usually most noticeable 2-3 days after quitting, but they usually do not last for more than 2-3 weeks. You may experience these symptoms: Mood swings. Restlessness, anxiety, or irritability. Trouble concentrating. Dizziness. Strong cravings for sugary foods and nicotine. Mild weight gain. Constipation. Nausea. Coughing or a sore throat. Changes in how the medicines that you take for unrelated issues work in your body. Depression. Trouble sleeping (insomnia). Week 3 and afterward After the first 2-3 weeks of quitting, you may start to notice more positive results, such as: Improved sense of smell and taste. Decreased coughing and sore throat. Slower heart rate. Lower blood pressure. Clearer skin. The ability to breathe more easily. Fewer sick days. Quitting smoking can be very challenging. Do not get discouraged if you are not successful the first time. Some people need to make many attempts to quit before they achieve long-term success. Do your best to stick to your quit plan, and talk with your health care provider if you have any questions or concerns. Summary Smoking tobacco is the leading cause of preventable death. Quitting smoking is one of the best things that you can do for your  health. When you decide to quit smoking, create a plan to help you succeed. Quit smoking right away, not slowly over a period of time. When you start quitting, seek help from your health care provider, family, or friends. This information is not intended to replace advice given to you by your health care provider. Make sure you discuss any questions you have with your health care provider. Document Revised: 09/30/2018 Document Reviewed: 03/26/2018 Elsevier Patient Education  2022 [employer].

## 2020-09-30 NOTE — Assessment & Plan Note (Signed)
Patient reports a slow-growing subcentimeter nodule along the left medial mandible.  States that this has been present for a few years however has grown in size.  Denies any pain, itchy or burning sensation.  No other cervical lymphadenopathy appreciated.  Given his extensive history of smoking there is concern of cancer.  Will obtain a CT of his neck to rule out malignancy.

## 2020-10-01 NOTE — Progress Notes (Signed)
Internal Medicine Clinic Attending ° °Case discussed with Dr. Rehman  At the time of the visit.  We reviewed the resident’s history and exam and pertinent patient test results.  I agree with the assessment, diagnosis, and plan of care documented in the resident’s note.  ° °

## 2020-10-02 ENCOUNTER — Encounter: Payer: Self-pay | Admitting: Cardiovascular Disease

## 2020-10-02 NOTE — Telephone Encounter (Signed)
Error

## 2020-10-18 ENCOUNTER — Ambulatory Visit
Admission: RE | Admit: 2020-10-18 | Discharge: 2020-10-18 | Disposition: A | Discharge status: Home / Self Care | Payer: Medicaid Other | Source: Ambulatory Visit | Attending: Internal Medicine | Admitting: Internal Medicine

## 2020-10-18 ENCOUNTER — Other Ambulatory Visit: Payer: Self-pay

## 2020-10-18 DIAGNOSIS — R221 Localized swelling, mass and lump, neck: Secondary | ICD-10-CM

## 2020-10-18 MED ORDER — IOPAMIDOL (ISOVUE-300) INJECTION 61%
75.0000 mL | Freq: Once | INTRAVENOUS | Status: AC | PRN
  Administered 2020-10-18: 75 mL via INTRAVENOUS

## 2020-11-12 ENCOUNTER — Telehealth: Payer: Self-pay | Admitting: Licensed Clinical Social Worker

## 2020-11-12 ENCOUNTER — Encounter: Payer: Self-pay | Admitting: Cardiovascular Disease

## 2020-11-12 ENCOUNTER — Other Ambulatory Visit: Payer: Self-pay

## 2020-11-12 ENCOUNTER — Ambulatory Visit (INDEPENDENT_AMBULATORY_CARE_PROVIDER_SITE_OTHER): Payer: Self-pay | Admitting: Cardiovascular Disease

## 2020-11-12 DIAGNOSIS — F17219 Nicotine dependence, cigarettes, with unspecified nicotine-induced disorders: Secondary | ICD-10-CM

## 2020-11-12 DIAGNOSIS — I10 Essential (primary) hypertension: Secondary | ICD-10-CM

## 2020-11-12 DIAGNOSIS — Z951 Presence of aortocoronary bypass graft: Secondary | ICD-10-CM

## 2020-11-12 DIAGNOSIS — I739 Peripheral vascular disease, unspecified: Secondary | ICD-10-CM

## 2020-11-12 DIAGNOSIS — E782 Mixed hyperlipidemia: Secondary | ICD-10-CM

## 2020-11-12 DIAGNOSIS — I6523 Occlusion and stenosis of bilateral carotid arteries: Secondary | ICD-10-CM

## 2020-11-12 NOTE — Patient Instructions (Signed)
Medication Instructions:  Your physician recommends that you continue on your current medications as directed. Please refer to the Current Medication list given to you today.  *If you need a refill on your cardiac medications before your next appointment, please call your pharmacy*   Testing/Procedures: Your physician has requested that you have a carotid duplex. This test is an ultrasound of the carotid arteries in your neck. It looks at blood flow through these arteries that supply the brain with blood. Allow one hour for this exam. There are no restrictions or special instructions. To be done in April 2023. This procedure is done at [facility].  Dr. Allyson Sabal has recommended that you have an Ultrasound of your AORTA/IVC/ILIACS.   To prepare for this test:  No food after 11PM the night before. Water is OK. (Don't drink liquids if you have been instructed not to for ANOTHER test). hours before the test or first thing in the morning. Avoid foods that produce bowel gas, for 24 hours prior to exam (see below). No breakfast, no chewing gum, no smoking or carbonated beverages. Patient may take morning medications with water. Come in for test at least 15 minutes early to register.   Your physician has requested that you have a lower extremity arterial duplex. This test is an ultrasound of the arteries in the legs. It looks at arterial blood flow in the legs. Allow one hour for Lower Arterial scans. There are no restrictions or special instructions  Your physician has requested that you have an ankle brachial index (ABI). During this test an ultrasound and blood pressure cuff are used to evaluate the arteries that supply the arms and legs with blood. Allow thirty minutes for this exam. There are no restrictions or special instructions.  These procedures are done at [facility].     Follow-Up: At [facility], you and your health needs are our priority.  As part of our continuing  mission to provide you with exceptional heart care, we have created designated Provider Care Teams.  These Care Teams include your primary Cardiologist (physician) and Advanced Practice Providers (APPs -  Physician Assistants and Nurse Practitioners) who all work together to provide you with the care you need, when you need it.  We recommend signing up for the patient portal called "MyChart".  Sign up information is provided on this After Visit Summary.  MyChart is used to connect with patients for Virtual Visits (Telemedicine).  Patients are able to view lab/test results, encounter notes, upcoming appointments, etc.  Non-urgent messages can be sent to your provider as well.   To learn more about what you can do with MyChart, go to [email or web address].    Your next appointment:   6 month(s)  The format for your next appointment:   In Person  Provider:   You will see one of the following Advanced Practice Providers on your designated Care Team:   Marjie Skiff, PA-C Edd Fabian, FNP  Then, Nanetta Batty, MD will plan to see you again in 12 month(s).   Other Instructions Pt needs a follow up with Dr. Rennis Golden.

## 2020-11-12 NOTE — Assessment & Plan Note (Signed)
History of ongoing tobacco abuse of 1 pack/day recalcitrant risk factor modification. 

## 2020-11-12 NOTE — Telephone Encounter (Signed)
Noted pt still is  uninsured per appt notes this morning. Called and was able to reach him at [phone]. Re-introduced self, role, reason for call. Pt states he was again denied for disability this summer (08/2020). "My lawyer is trying to see if we can appeal again." I shared that pt had not gotten back in touch with me to ever complete applications sent to him for [employer] and [employer]. He states he "needs help b/c I cant keep affording these bills." I shared that pt had been provided with assistance applications multiple times and I have offered assistance also multiple times. I would be more than happy to send him the documents again and remain available for questions but that it is up to pt to complete and submit appropriate documents for assistance. Pt agreeable to me mailing these again to home address.   Octavio Graves, MSW, LCSW [facility] Navigation  [phone]

## 2020-11-12 NOTE — Assessment & Plan Note (Signed)
History of CAD status post left main stenting in [facility] and recatheterization by Dr. 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  He underwent CABG x3 by Dr. [name] 04/07/2017 with a [name] to his LAD, vein to OM1 and [name].  He currently denies chest pain or shortness of breath.

## 2020-11-12 NOTE — Assessment & Plan Note (Signed)
History of bilateral ICA stenosis left greater than right with carotid Dopplers performed 05/01/2020 revealing moderate disease bilaterally.  We will repeat this in April of next year.

## 2020-11-12 NOTE — Progress Notes (Signed)
11/12/2020 [name] [name]   1970/10/17  [id number]  Primary Physician [name] Stabler, DO Primary Cardiologist: [name] Gess MD [name] [name]  HPI:  [name] [name] is a 50 y.o.  engaged Caucasian male (fiancs name is [name] [name]) father of one 101 year old son who is referred by [name] [name] registered ,nurse practitioner, for peripheral vascular evaluation. I last saw him in the 05/17/2020. He has a history of acute tobacco abuse having smoked 40-60 pack years, treated hypertension and hyperlipidemia. She has never had a heart attack or stroke and denies chest pain or shortness of breath. He saw Dr. Nadara [name]  years ago and ultimately underwent angiography/ 05/16/13 revealing bilateral iliac disease both of which were stented with balloon expandable and self expanding stents. He enjoyed symptomatic relief for approximately one year and then developed recurrent left calf claudication. Recent Dopplers performed 05/02/15 revealed a left ABI 0.78 with a high-frequency signal in his left external iliac artery. I performed lower extremity angiography on him 06/06/15 revealing a patent left common and external iliac artery stent with a 95% stenosis at the distal edge of the proximal stent which I restented with a 9 mm x 3 cm nitinol self expanding stent. His symptoms of claudication have resolved and his Dopplers have normalized. He has in addition stop smoking by his account. He had a left main stent placed in [facility] August of last year and underwent repeat cardiac catheterization by Dr. Excell [name] in the setting of unstable angina 04/09/2017 revealing 80% "in-stent restenosis.  He underwent coronary artery bypass grafting x3 by Dr. Tyrone [name] 04/07/2017 with a [name] to the LAD and vein to [name] and 2.  He is done well since.  He has stopped smoking.     I performed peripheral angiography and intervention on him 11/07/2017 with stenting of his right common iliac artery with a 7 mm x 29 mm long balloon  expandable stent.  The previous stent I placed in his left leg were widely patent angiographically.  His follow-up Doppler studies normalized and his claudication has resolved.   He had developed progressive right lower extremity claudication with Doppler studies performed 05/12/2019 revealing a decline in his right ABI to 0.7 with high-frequency signals in his right common and external iliac artery suggesting progression of disease and/or in-stent restenosis.  He does continue to smoke.  Unfortunately, he was out of all of his medicines for 8 months up until recently when he restarted those.  He did have a 2D echo performed 05/09/2019 revealing slightly lower EF than in the past in the 45 to 50% range with an inferoapical [name] [name] motion abnormality.   I performed peripheral angiography on him 05/29/2019 revealing high-grade disease in his right common extra iliac artery both of which were restented.  He had a VBX stent placed in his right common iliac artery and a nitinol self-expanding stent in his external iliac artery.  His right ABI increased from 0.7 up to 0.98.  His claudication has resolved.   Since I saw him in the office 6 months ago he does complain of discomfort in his back and hips in the morning but really denies claudication.  He still smoking a pack a day.  He takes a statin drugs intermittently.  He denies chest pain or shortness of breath.   Current Meds  Medication Sig   aspirin EC 81 MG tablet Take 1 tablet (81 mg total) by mouth daily.   carvedilol (COREG) 3.125 MG tablet  Take 1 tablet (3.125 mg total) by mouth 2 (two) times daily.   clopidogrel (PLAVIX) 75 MG tablet Take 1 tablet (75 mg total) by mouth daily.   [DISCONTINUED] clopidogrel (PLAVIX) 75 MG tablet TAKE 1 TABLET (75 MG TOTAL) BY MOUTH DAILY.   Current Facility-Administered Medications for the 11/12/20 encounter (Office Visit) with [name] Gess, MD  Medication   sodium chloride flush (NS) 0.9 % injection 3 mL      Allergies  Allergen Reactions   Codeine Other (See Comments)    Migraine     Social History   Socioeconomic History   Marital status: Divorced    Spouse name: Not on file   Number of children: Not on file   Years of education: Not on file   Highest education level: Not on file  Occupational History   Occupation: [employer] doctor  Tobacco Use   Smoking status: Some Days    Packs/day: 1.00    Years: 30.00    Pack years: 30.00    Types: Cigarettes    Last attempt to quit: 06/07/2015    Years since quitting: 5.4   Smokeless tobacco: Former    Types: Snuff   Tobacco comments:    "used snuff in my teens"  Vaping Use   Vaping Use: Former  Substance and Sexual Activity   Alcohol use: Yes    Alcohol/week: 0.0 standard drinks    Comment: 06/06/2015 "might have 1 beer/month"   Drug use: No   Sexual activity: Not Currently  Other Topics Concern   Not on file  Social History Narrative   Not on file   Social Determinants of Health   Financial Resource Strain: High Risk   Difficulty of Paying Living Expenses: Very hard  Food Insecurity: No Food Insecurity   Worried About Programme researcher, broadcasting/film/video in the Last Year: Never true   Ran Out of Food in the Last Year: Never true  Transportation Needs: No Transportation Needs   Lack of Transportation (Medical): No   Lack of Transportation (Non-Medical): No  Physical Activity: Not on file  Stress: Not on file  Social Connections: Not on file  Intimate Partner Violence: Not on file     Review of Systems: General: negative for chills, fever, night sweats or weight changes.  Cardiovascular: negative for chest pain, dyspnea on exertion, edema, orthopnea, palpitations, paroxysmal nocturnal dyspnea or shortness of breath Dermatological: negative for rash Respiratory: negative for cough or wheezing Urologic: negative for hematuria Abdominal: negative for nausea, vomiting, diarrhea, bright red blood per rectum, melena, or  hematemesis Neurologic: negative for visual changes, syncope, or dizziness All other systems reviewed and are otherwise negative except as noted above.    Blood pressure 126/72, pulse 67, height 5\' 9"  (1.753 m), weight 185 lb (83.9 kg), SpO2 96 %.  General appearance: alert and no distress Neck: no adenopathy, no carotid bruit, no JVD, supple, symmetrical, trachea midline, and thyroid not enlarged, symmetric, no tenderness/mass/nodules Lungs: clear to auscultation bilaterally Heart: regular rate and rhythm, S1, S2 normal, no murmur, click, rub or gallop Extremities: extremities normal, atraumatic, no cyanosis or edema Pulses: 2+ and symmetric Skin: Skin color, texture, turgor normal. No rashes or lesions Neurologic: Grossly normal  EKG sinus rhythm at 67 with occasional PVCs.  I personally reviewed this EKG.  ASSESSMENT AND PLAN:   Nicotine dependence History of ongoing tobacco abuse of 1 pack/day recalcitrant risk factor modification.  Claudication in peripheral vascular disease (HCC) History of peripheral arterial disease status  post multiple right and left common and external iliac artery stent procedures over the last several years beginning in 2019 and most recently 05/29/2019.  At that time I stented his distal right common iliac and right extrailiac arteries.  His most recent lower extremity arterial Doppler studies performed 05/01/2020 revealed a right ABI of 0.98 and left of 0.92.  His iliac stents appeared patent.  He does complain of pain in his legs and hips when he wakes up but does not really complain of claudication.  We will recheck aortoiliac and lower extremity arterial Doppler studies.  HLD (hyperlipidemia) History of hyperlipidemia on statin therapy which he takes intermittently by his own account.  Most recent lipid profile performed 04/18/2020 revealed total cholesterol 227, LDL of 146 and HDL of 27.  We talked about the importance of medication compliance and dietary  modification.  I am referring him back to Dr. Rennis Golden for further evaluation and management of his hyperlipidemia.  Essential hypertension History of essential hypertension a blood pressure measured today 126/72.  He is on carvedilol and losartan.  S/P CABG x 3 History of CAD status post left main stenting in [facility] and recatheterization by Dr. Excell [name] 04/09/2017 in the setting of unstable angina revealing "80% in-stent restenosis.  He underwent CABG x3 by Dr. Tyrone [name] 04/07/2017 with a [name] to his LAD, vein to OM1 and [name].  He currently denies chest pain or shortness of breath.  Carotid artery disease (HCC) History of bilateral ICA stenosis left greater than right with carotid Dopplers performed 05/01/2020 revealing moderate disease bilaterally.  We will repeat this in April of next year.     [name] Gess MD FACP,FACC,FAHA, [facility] 11/12/2020 9:33 AM

## 2020-11-12 NOTE — Assessment & Plan Note (Signed)
History of hyperlipidemia on statin therapy which he takes intermittently by his own account.  Most recent lipid profile performed 04/18/2020 revealed total cholesterol 227, LDL of 146 and HDL of 27.  We talked about the importance of medication compliance and dietary modification.  I am referring him back to Dr. Rennis Golden for further evaluation and management of his hyperlipidemia.

## 2020-11-12 NOTE — Assessment & Plan Note (Signed)
History of peripheral arterial disease status post multiple right and left common and external iliac artery stent procedures over the last several years beginning in 2019 and most recently 05/29/2019.  At that time I stented his distal right common iliac and right extrailiac arteries.  His most recent lower extremity arterial Doppler studies performed 05/01/2020 revealed a right ABI of 0.98 and left of 0.92.  His iliac stents appeared patent.  He does complain of pain in his legs and hips when he wakes up but does not really complain of claudication.  We will recheck aortoiliac and lower extremity arterial Doppler studies.

## 2020-11-12 NOTE — Assessment & Plan Note (Signed)
History of essential hypertension a blood pressure measured today 126/72.  He is on carvedilol and losartan.

## 2020-11-13 ENCOUNTER — Other Ambulatory Visit (HOSPITAL_COMMUNITY): Payer: Self-pay | Admitting: Cardiovascular Disease

## 2020-11-13 DIAGNOSIS — I739 Peripheral vascular disease, unspecified: Secondary | ICD-10-CM

## 2020-11-14 ENCOUNTER — Ambulatory Visit (HOSPITAL_COMMUNITY)
Admission: RE | Admit: 2020-11-14 | Discharge: 2020-11-14 | Disposition: A | Discharge status: Home / Self Care | Payer: Medicaid Other | Source: Ambulatory Visit | Attending: Cardiology | Admitting: Cardiology

## 2020-11-14 ENCOUNTER — Other Ambulatory Visit: Payer: Self-pay

## 2020-11-14 ENCOUNTER — Ambulatory Visit (HOSPITAL_COMMUNITY)
Admission: RE | Admit: 2020-11-14 | Discharge: 2020-11-14 | Disposition: A | Discharge status: Home / Self Care | Payer: Self-pay | Source: Ambulatory Visit | Attending: Cardiology | Admitting: Cardiology

## 2020-11-14 ENCOUNTER — Other Ambulatory Visit (HOSPITAL_COMMUNITY): Payer: Self-pay | Admitting: Cardiovascular Disease

## 2020-11-14 ENCOUNTER — Inpatient Hospital Stay (HOSPITAL_COMMUNITY): Admission: RE | Admit: 2020-11-14 | Payer: Medicaid Other | Source: Ambulatory Visit

## 2020-11-14 DIAGNOSIS — Z95828 Presence of other vascular implants and grafts: Secondary | ICD-10-CM

## 2020-11-14 DIAGNOSIS — I739 Peripheral vascular disease, unspecified: Secondary | ICD-10-CM

## 2020-11-15 ENCOUNTER — Encounter (HOSPITAL_COMMUNITY): Payer: Medicaid Other

## 2020-11-18 ENCOUNTER — Emergency Department (HOSPITAL_COMMUNITY)
Admission: EM | Admit: 2020-11-18 | Discharge: 2020-11-19 | Disposition: A | Discharge status: Home / Self Care | Payer: Self-pay | Attending: Emergency Medicine | Admitting: Emergency Medicine

## 2020-11-18 ENCOUNTER — Emergency Department (HOSPITAL_COMMUNITY): Payer: Self-pay

## 2020-11-18 ENCOUNTER — Encounter (HOSPITAL_COMMUNITY): Payer: Self-pay

## 2020-11-18 ENCOUNTER — Other Ambulatory Visit: Payer: Self-pay

## 2020-11-18 ENCOUNTER — Encounter (HOSPITAL_COMMUNITY): Payer: Medicaid Other

## 2020-11-18 DIAGNOSIS — M79601 Pain in right arm: Secondary | ICD-10-CM | POA: Insufficient documentation

## 2020-11-18 DIAGNOSIS — R200 Anesthesia of skin: Secondary | ICD-10-CM | POA: Insufficient documentation

## 2020-11-18 DIAGNOSIS — Z5321 Procedure and treatment not carried out due to patient leaving prior to being seen by health care provider: Secondary | ICD-10-CM | POA: Insufficient documentation

## 2020-11-18 LAB — BASIC METABOLIC PANEL
Anion gap: 7 (ref 5–15)
BUN: 11 mg/dL (ref 6–20)
CO2: 28 mmol/L (ref 22–32)
Calcium: 8.8 mg/dL — ABNORMAL LOW (ref 8.9–10.3)
Chloride: 105 mmol/L (ref 98–111)
Creatinine, Ser: 1.25 mg/dL — ABNORMAL HIGH (ref 0.61–1.24)
GFR, Estimated: >60 mL/min (ref >60)
Glucose, Bld: 89 mg/dL (ref 70–99)
Potassium: 4.2 mmol/L (ref 3.5–5.1)
Sodium: 140 mmol/L (ref 135–145)

## 2020-11-18 LAB — CBC
HCT: 45.7 % (ref 39.0–52.0)
Hemoglobin: 15.2 g/dL (ref 13.0–17.0)
MCH: 29.5 pg (ref 26.0–34.0)
MCHC: 33.3 g/dL (ref 30.0–36.0)
MCV: 88.6 fL (ref 80.0–100.0)
Platelets: 248 10*3/uL (ref 150–400)
RBC: 5.16 MIL/uL (ref 4.22–5.81)
RDW: 14.3 % (ref 11.5–15.5)
WBC: 13.3 10*3/uL — ABNORMAL HIGH (ref 4.0–10.5)
nRBC: 0 % (ref 0.0–0.2)

## 2020-11-18 LAB — TROPONIN I (HIGH SENSITIVITY): Troponin I (High Sensitivity): 20 ng/L — ABNORMAL HIGH (ref <18)

## 2020-11-18 LAB — CBG MONITORING, ED: Glucose-Capillary: 92 mg/dL (ref 70–99)

## 2020-11-18 MED ORDER — NITROGLYCERIN 0.4 MG SL SUBL: 0.4000 mg | SUBLINGUAL_TABLET | SUBLINGUAL | Status: DC | PRN

## 2020-11-18 NOTE — ED Triage Notes (Signed)
Pt reports right arm pain that radiates to his hand, onset 6pm tonight associated with mild numbness & tingling but he states mostly felt like a cramp with throbbing. He also reports pain to right leg. Neuro exam intact. Ambulatory with independent steady gait.

## 2020-11-18 NOTE — ED Provider Notes (Signed)
Emergency Medicine Provider Triage Evaluation Note  [name] [name] , a 50 y.o. male  was evaluated in triage.  Pt complains of right arm pain.  Patient states that started suddenly this evening at about 6 PM.  Some numbness and tingling but primary pain that feels like a cramp in his right arm.  He denies any headache.  No trouble with speech.  Patient does have significant history of coronary artery disease and has had a bypass and bilateral iliac stents.  Patient does continue to smoke cigarettes  Review of Systems  Positive: Arm pain Negative: No headache, no chest pain or facial weakness   Physical Exam  BP (!) 144/79 (BP Location: Left Arm)   Pulse (!) 56   Temp 98.2 F (36.8 C) (Oral)   Resp 19   Ht 1.753 m (5\' 9" )   Wt 84.8 kg   SpO2 97%   BMI 27.62 kg/m  Gen:   Awake, no distress   Resp:  Normal effort  MSK:   Moves extremities without difficulty , ulnar pulse palpable, radial pulse weaker on the right side than left, no cyanosis Other:    Medical Decision Making  Medically screening exam initiated at 9:25 PM.  Appropriate orders placed.  [name] [name] was informed that the remainder of the evaluation will be completed by another provider, this initial triage assessment does not replace that evaluation, and the importance of remaining in the ED until their evaluation is complete.  Symptoms atypical for acute coronary syndrome.  May have vascular spasm.  No focal findings.  Dextrorotated time.  We will proceed with further work-up   , MD 11/18/20 [id number]

## 2020-11-19 LAB — TROPONIN I (HIGH SENSITIVITY): Troponin I (High Sensitivity): 22 ng/L — ABNORMAL HIGH (ref <18)

## 2020-11-19 NOTE — ED Notes (Signed)
Patient states he is leaving d/t wait time 

## 2020-12-05 ENCOUNTER — Ambulatory Visit: Payer: Medicaid Other | Admitting: Student

## 2021-04-28 ENCOUNTER — Ambulatory Visit (HOSPITAL_COMMUNITY)
Admission: RE | Admit: 2021-04-28 | Discharge: 2021-04-28 | Disposition: A | Discharge status: Home / Self Care | Payer: Self-pay | Source: Ambulatory Visit | Attending: Cardiology | Admitting: Cardiology

## 2021-04-28 DIAGNOSIS — I6523 Occlusion and stenosis of bilateral carotid arteries: Secondary | ICD-10-CM

## 2021-04-29 NOTE — Assessment & Plan Note (Signed)
Bilateral carotid US 04/28/21 showed moderate obstruction. ? ?- Repeat in 1 year ?
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
## 2021-05-14 ENCOUNTER — Ambulatory Visit (HOSPITAL_COMMUNITY)
Admission: RE | Admit: 2021-05-14 | Discharge: 2021-05-14 | Disposition: A | Discharge status: Home / Self Care | Payer: Self-pay | Source: Ambulatory Visit | Attending: Cardiovascular Disease | Admitting: Cardiovascular Disease

## 2021-05-14 ENCOUNTER — Ambulatory Visit (HOSPITAL_COMMUNITY)
Admission: RE | Admit: 2021-05-14 | Discharge: 2021-05-14 | Disposition: A | Discharge status: Home / Self Care | Payer: Medicaid Other | Source: Ambulatory Visit | Attending: Cardiovascular Disease | Admitting: Cardiovascular Disease

## 2021-05-14 DIAGNOSIS — Z95828 Presence of other vascular implants and grafts: Secondary | ICD-10-CM

## 2021-05-15 ENCOUNTER — Encounter: Payer: Self-pay | Admitting: General Practice

## 2021-05-15 ENCOUNTER — Telehealth: Payer: Self-pay | Admitting: Licensed Clinical Social Worker

## 2021-05-15 ENCOUNTER — Ambulatory Visit (INDEPENDENT_AMBULATORY_CARE_PROVIDER_SITE_OTHER): Payer: Self-pay | Admitting: General Practice

## 2021-05-15 VITALS — BP 134/72 | HR 66 | Ht 69.0 in | Wt 185.0 lb

## 2021-05-15 DIAGNOSIS — I739 Peripheral vascular disease, unspecified: Secondary | ICD-10-CM

## 2021-05-15 DIAGNOSIS — Z951 Presence of aortocoronary bypass graft: Secondary | ICD-10-CM

## 2021-05-15 DIAGNOSIS — E782 Mixed hyperlipidemia: Secondary | ICD-10-CM

## 2021-05-15 DIAGNOSIS — I10 Essential (primary) hypertension: Secondary | ICD-10-CM

## 2021-05-15 DIAGNOSIS — F17200 Nicotine dependence, unspecified, uncomplicated: Secondary | ICD-10-CM

## 2021-05-15 NOTE — Patient Instructions (Signed)
Medication Instructions:  ?The current medical regimen is effective;  continue present plan and medications as directed. Please refer to the Current Medication list given to you today.  ? ?*If you need a refill on your cardiac medications before your next appointment, please call your pharmacy* ? ?Lab Work:     ?FASTING LIPID AND LFT     ? ?If you have labs (blood work) drawn today and your tests are completely normal, you will receive your results only by: ?MyChart Message (if you have MyChart) OR  A paper copy in the mail ?If you have any lab test that is abnormal or we need to change your treatment, we will call you to review the results. ? ?Special Instructions ?PLEASE READ AND FOLLOW SALTY 6-ATTACHED-1,800mg  daily ? ?PLEASE INCREASE PHYSICAL ACTIVITY AS TOLERATED  ? ?PLEASE READ AND FOLLOW SMOKING CESSATION TIPS-ATTACHED ? ?PLEASE READ AND FOLLOW INCREASED FIBER DIET-ATTACHED ? ?TAKE AND LOG YOU BLOOD PRESSURE ? ?Follow-Up: ?Your next appointment:  6 month(s) In Person with Nanetta Batty, MD 1    ? ?At [facility], you and your health needs are our priority.  As part of our continuing mission to provide you with exceptional heart care, we have created designated Provider Care Teams.  These Care Teams include your primary Cardiologist (physician) and Advanced Practice Providers (APPs -  Physician Assistants and Nurse Practitioners) who all work together to provide you with the care you need, when you need it. ? ? ?Important Information About Sugar ? ? ? ? ? ? ?        6 SALTY THINGS TO AVOID     1,800MG  DAILY ? ? ? ? ?High-Fiber Eating Plan ?Fiber, also called dietary fiber, is a type of carbohydrate. It is found foods such as fruits, vegetables, whole grains, and beans. A high-fiber diet can have many health benefits. Your health care provider may recommend a high-fiber diet to help: ?Prevent constipation. Fiber can make your bowel movements more regular. ?Lower your cholesterol. ?Relieve the following  conditions: ?Inflammation of veins in the anus (hemorrhoids). ?Inflammation of specific areas of the digestive tract (uncomplicated diverticulosis). ?A problem of the large intestine, also called the colon, that sometimes causes pain and diarrhea (irritable bowel syndrome, or IBS). ?Prevent overeating as part of a weight-loss plan. ?Prevent heart disease, type 2 diabetes, and certain cancers. ?What are tips for following this plan? ?Reading food labels ? ?Check the nutrition facts label on food products for the amount of dietary fiber. Choose foods that have 5 grams of fiber or more per serving. ?The goals for recommended daily fiber intake include: ?Men (age 92 or younger): 34-38 g. ?Men (over age 24): 28-34 g. ?Women (age 75 or younger): 25-28 g. ?Women (over age 61): 22-25 g. ?Shopping ?Choose whole fruits and vegetables instead of processed forms, such as apple juice or applesauce. ?Choose a wide variety of high-fiber foods such as avocados, lentils, oats, and kidney beans. ?Read the nutrition facts label of the foods you choose. Be aware of foods with added fiber. These foods often have high sugar and sodium amounts per serving. ?Cooking ?Use whole-grain flour for baking and cooking. ?Cook with brown rice instead of white rice. ?Meal planning ?Start the day with a breakfast that is high in fiber, such as a cereal that contains 5 g of fiber or more per serving. ?Eat breads and cereals that are made with whole-grain flour instead of refined flour or white flour. ?Eat brown rice, bulgur wheat, or millet  instead of white rice. ?Use beans in place of meat in soups, salads, and pasta dishes. ?Be sure that half of the grains you eat each day are whole grains. ?General information ?You can get the recommended daily intake of dietary fiber by: ?Eating a variety of fruits, vegetables, grains, nuts, and beans. ?Taking a fiber supplement if you are not able to take in enough fiber in your diet. It is better to get fiber  through food than from a supplement. ?Gradually increase how much fiber you consume. If you increase your intake of dietary fiber too quickly, you may have bloating, cramping, or gas. ?Drink plenty of water to help you digest fiber. ?Choose high-fiber snacks, such as berries, raw vegetables, nuts, and popcorn. ?What foods should I eat? ?Fruits ?Berries. Pears. Apples. Oranges. Avocado. Prunes and raisins. Dried figs. ?Vegetables ?Sweet potatoes. Spinach. Kale. Artichokes. Cabbage. Broccoli. Cauliflower. Green peas. Carrots. Squash. ?Grains ?Whole-grain breads. Multigrain cereal. Oats and oatmeal. Brown rice. Barley. Bulgur wheat. Millet. Quinoa. Bran muffins. Popcorn. Rye wafer crackers. ?Meats and other proteins ?Navy beans, kidney beans, and pinto beans. Soybeans. Split peas. Lentils. Nuts and seeds. ?Dairy ?Fiber-fortified yogurt. ?Beverages ?Fiber-fortified soy milk. Fiber-fortified orange juice. ?Other foods ?Fiber bars. ?The items listed above may not be a complete list of recommended foods and beverages. Contact a dietitian for more information. ?What foods should I avoid? ?Fruits ?Fruit juice. Cooked, strained fruit. ?Vegetables ?Fried potatoes. Canned vegetables. Well-cooked vegetables. ?Grains ?White bread. Pasta made with refined flour. White rice. ?Meats and other proteins ?Fatty cuts of meat. Fried chicken or fried fish. ?Dairy ?Milk. Yogurt. Cream cheese. Sour cream. ?Fats and oils ?Butters. ?Beverages ?Soft drinks. ?Other foods ?Cakes and pastries. ?The items listed above may not be a complete list of foods and beverages to avoid. Talk with your dietitian about what choices are best for you. ?Summary ?Fiber is a type of carbohydrate. It is found in foods such as fruits, vegetables, whole grains, and beans. ?A high-fiber diet has many benefits. It can help to prevent constipation, lower blood cholesterol, aid weight loss, and reduce your risk of heart disease, diabetes, and certain cancers. ?Increase  your intake of fiber gradually. Increasing fiber too quickly may cause cramping, bloating, and gas. Drink plenty of water while you increase the amount of fiber you consume. ?The best sources of fiber include whole fruits and vegetables, whole grains, nuts, seeds, and beans. ?This information is not intended to replace advice given to you by your health care provider. Make sure you discuss any questions you have with your health care provider. ?Document Revised: 05/11/2019 Document Reviewed: 05/11/2019 ?Elsevier Patient Education ? 2023 Elsevier Inc. ? ? ? ?Steps to Quit Smoking ?Smoking tobacco is the leading cause of preventable death. It can affect almost every organ in the body. Smoking puts you and people around you at risk for many serious, long-lasting (chronic) diseases. Quitting smoking can be hard, but it is one of the best things that you can do for your health. It is never too late to quit. ?Do not give up if you cannot quit the first time. Some people need to try many times to quit. Do your best to stick to your quit plan, and talk with your doctor if you have any questions or concerns. ?How do I get ready to quit? ?Pick a date to quit. Set a date within the next 2 weeks to give you time to prepare. ?Write down the reasons why you are quitting. Keep this list in  places where you will see it often. ?Tell your family, friends, and co-workers that you are quitting. Their support is important. ?Talk with your doctor about the choices that may help you quit. ?Find out if your health insurance will pay for these treatments. ?Know the people, places, things, and activities that make you want to smoke (triggers). Avoid them. ?What first steps can I take to quit smoking? ?Throw away all cigarettes at home, at work, and in your car. ?Throw away the things that you use when you smoke, such as ashtrays and lighters. ?Clean your car. Empty the ashtray. ?Clean your home, including curtains and carpets. ?What can I do  to help me quit smoking? ?Talk with your doctor about taking medicines and seeing a counselor. You are more likely to succeed when you do both. ?If you are pregnant or breastfeeding: ?Talk with your doctor

## 2021-05-15 NOTE — Telephone Encounter (Signed)
Pt NCMedAssist had elapsed on 03/19/21.  ?I have mailed pt new application, along with [employer] and [employer]. Previously struggled to maintain contact with pt, I have included my card if he should wish to engage/receive assistance.  ? ?Westley Hummer, MSW, LCSW ?Clinical Social Worker II ?[facility]  ?[phone]- work cell phone (preferred) ?[phone]- desk phone ? ?

## 2021-06-09 ENCOUNTER — Other Ambulatory Visit (HOSPITAL_COMMUNITY): Payer: Self-pay | Admitting: Cardiovascular Disease

## 2021-06-09 DIAGNOSIS — I6523 Occlusion and stenosis of bilateral carotid arteries: Secondary | ICD-10-CM

## 2021-08-13 ENCOUNTER — Ambulatory Visit (HOSPITAL_COMMUNITY)
Admission: RE | Admit: 2021-08-13 | Discharge: 2021-08-13 | Disposition: A | Discharge status: Home / Self Care | Payer: Medicaid Other | Source: Ambulatory Visit | Attending: Internal Medicine | Admitting: Internal Medicine

## 2021-08-13 ENCOUNTER — Other Ambulatory Visit (HOSPITAL_COMMUNITY): Payer: Self-pay

## 2021-08-13 ENCOUNTER — Encounter: Payer: Self-pay | Admitting: Student

## 2021-08-13 ENCOUNTER — Other Ambulatory Visit: Payer: Self-pay

## 2021-08-13 ENCOUNTER — Other Ambulatory Visit: Payer: Self-pay | Admitting: Student

## 2021-08-13 ENCOUNTER — Ambulatory Visit (INDEPENDENT_AMBULATORY_CARE_PROVIDER_SITE_OTHER): Payer: Self-pay | Admitting: Student

## 2021-08-13 ENCOUNTER — Telehealth: Payer: Self-pay | Admitting: Cardiovascular Disease

## 2021-08-13 ENCOUNTER — Encounter: Payer: Self-pay | Admitting: Cardiovascular Disease

## 2021-08-13 VITALS — BP 149/86 | HR 58 | Temp 97.7°F | Resp 24 | Ht 69.0 in | Wt 187.5 lb

## 2021-08-13 DIAGNOSIS — Z7902 Long term (current) use of antithrombotics/antiplatelets: Secondary | ICD-10-CM

## 2021-08-13 DIAGNOSIS — I739 Peripheral vascular disease, unspecified: Secondary | ICD-10-CM

## 2021-08-13 DIAGNOSIS — M1611 Unilateral primary osteoarthritis, right hip: Secondary | ICD-10-CM | POA: Insufficient documentation

## 2021-08-13 DIAGNOSIS — F17219 Nicotine dependence, cigarettes, with unspecified nicotine-induced disorders: Secondary | ICD-10-CM

## 2021-08-13 DIAGNOSIS — M25551 Pain in right hip: Secondary | ICD-10-CM

## 2021-08-13 DIAGNOSIS — I6523 Occlusion and stenosis of bilateral carotid arteries: Secondary | ICD-10-CM

## 2021-08-13 DIAGNOSIS — I25810 Atherosclerosis of coronary artery bypass graft(s) without angina pectoris: Secondary | ICD-10-CM

## 2021-08-13 DIAGNOSIS — F17210 Nicotine dependence, cigarettes, uncomplicated: Secondary | ICD-10-CM

## 2021-08-13 DIAGNOSIS — L729 Follicular cyst of the skin and subcutaneous tissue, unspecified: Secondary | ICD-10-CM

## 2021-08-13 DIAGNOSIS — R55 Syncope and collapse: Secondary | ICD-10-CM

## 2021-08-13 DIAGNOSIS — I10 Essential (primary) hypertension: Secondary | ICD-10-CM

## 2021-08-13 MED ORDER — CARVEDILOL 3.125 MG PO TABS
3.1250 mg | ORAL_TABLET | Freq: Two times a day (BID) | ORAL | 11 refills | Status: DC
  Filled 2021-08-13 – 2021-11-05 (×3): qty 60, 30d supply, fill #0

## 2021-08-13 NOTE — Assessment & Plan Note (Addendum)
Patient report intermittent and random presyncopal episodes.  Patient reports symptoms of palpitation, chest tightness, shortness of breath and lightheadedness associated with the episode.  States that the episode only lasts seconds to minutes.  It happens once every 2 to 3 weeks.  Denies any loss of consciousness.  He denies any recurrent vertigo symptoms or symptoms with postural change.  Report most recent episode was 2 days ago when he was standing in the house.  He then experienced lightheadedness sensation with chest tightness and weakness that brought him down to 1 knee.  This episode only lasted 10 seconds.  No loss of consciousness.  Orthostatic vital sign was obtained in the clinic was negative.  Patient was asymptomatic during the maneuver.  His presyncopal episodes are not consistent with vasovagal or orthostatic.  I am worried about a cardiogenic cause.  No evidence of severe aortic stenosis on echo.  Patient has history of preoperative A-fib in 2019 without recurrence.  He has a cardiac event monitor ordered but never done it.  I suspect this could be paroxysmal A-fib in the setting of carotid stenosis causing hypoperfusion event.  -I messaged Dr. Allyson Sabal, his cardiologist, about his symptoms and possible cardiac event monitor.  He has a follow-up appointment in October but I will ask if he can be seen sooner.

## 2021-08-13 NOTE — Assessment & Plan Note (Addendum)
He is currently taking Plavix but not aspirin.  He only takes his atorvastatin intermittently and states that he is working on his diet for cholesterol control.  He thought that atorvastatin can cause sedation effects so he only takes it at night which sometimes he forgets.  Last LDL 146 in March 2022.  -Patient agrees with resuming atorvastatin 80 mg daily.  I explained the importance of cholesterol control in his CAD and PAD -Recheck lipid panel at next visit -Continue aspirin for now.  His last stent was placed in 2021 so he will not need to be on dual antiplatelet therapy.  We will continue aspirin per cardiology notes.
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
## 2021-08-13 NOTE — Assessment & Plan Note (Addendum)
Patient reports chronic bilateral hip pain that has been going on for many years but is more concerned of the right hip due to anatomical asymmetry.  Patient has been noticing a bony protrusion on the right hip about 1 week ago.  He denies any trauma to the area.  States that he can bear weight on bilateral hip but is painful to walk.  Say that pain localized in the back of the hips and occasionally travel down to bilateral legs.  Patient is taking Tylenol 500 mg twice daily for pain relief.  Patient has normal range of motion bilateral hips but mildly limited due to pain.  Positive straight leg raise test on the left side.  Negative windshield wiper test bilaterally.  Right hip does have a bony protrusion compared to the left side.  Mild tenderness to palpation at the protrusion.  No changes of overlying skin.  His symptoms are consistent with osteoarthritis.  Low suspicion for osteonecrosis.  For his bony protrusion will obtain an x-ray of his right hip to rule out any dislocation or bony abnormality.  Advised patient to continue Tylenol for pain control.    Addendum Right hip x-ray was negative for any fracture or dislocation.  There was evidence of degenerative change (joint space narrowing, acetabular sclerosis and bony spurring) We discussed treatment of his right hip OA which including pain control with Tylenol.  Avoid NSAIDs in the setting of CAD and PAD.  He will benefit from physical therapy which he agrees to.  He is currently uninsured but states that he should have coverage next month.  Patient will call the clinic to notify us when he has insurance and I will place order for his PT referral

## 2021-08-13 NOTE — Assessment & Plan Note (Signed)
Patient reports smoking 1 pack a day.  We talked about the risk of smoking for his CAD and PAD.  Patient verbalizes understanding but he is not motivated to quit.

## 2021-08-13 NOTE — Assessment & Plan Note (Signed)
CT head and neck confirmed subcutaneous cyst.  He states that the cyst is still growing would like to get it removed.  Patient currently uninsured.  We will place dermatology referral when he obtains coverage.

## 2021-08-13 NOTE — Patient Instructions (Addendum)
[name] [name],  It was nice seeing you in the clinic today.  Here is a summary what we talked about:  1.  For your spells, I am worried that it may be from your heart.  I will message Dr. Allyson Sabal about a cardiac monitor.  2.  I will obtain an x-ray of your right hip to rule out any dislocation or bony abnormality.  In the meantime please continue Tylenol for your pain.  3.  We will have to wait until you get insurance to place a dermatology referral.  4.  I will refill your carvedilol.  Please take 1 tablet twice daily.  5.  Please start taking atorvastatin 80 mg daily to help with your cholesterol.  We would recheck your cholesterol numbers at next visit.  6.  Please try to cut back on tobacco use is much as you can.  Please return in 1 month, sooner if needed  Take care,  Dr. Cyndie Chime

## 2021-08-13 NOTE — Telephone Encounter (Signed)
Pt c/o medication issue:  1. Name of Medication: clopidogrel  2. How are you currently taking this medication (dosage and times per day)?    3. Are you having a reaction (difficulty breathing--STAT)? No   4. What is your medication issue? Patient calling in to see why this medication is not on his current medication. Please advise

## 2021-08-13 NOTE — Telephone Encounter (Signed)
Called pt and let him know the provider he seen today discontinued Plavix today since he is taking Aspirin as well. Pt verbalized understanding, no further questions at this time.

## 2021-08-13 NOTE — Assessment & Plan Note (Signed)
Blood pressure mildly elevated at 140/85.  Patient only takes his carvedilol 3.25 mg once a day because he worried about running out of his medication.  Patient is currently uninsured but will have coverage next month.  -Refill of carvedilol sent to [facility] outpatient pharmacy.  Advised patient to take this medication twice daily.

## 2021-08-13 NOTE — Telephone Encounter (Signed)
Addressed by another nurse. See telephone note.

## 2021-08-18 NOTE — Progress Notes (Signed)
Internal Medicine Clinic Attending  Case discussed with Dr. Nguyen  at the time of the visit.  We reviewed the resident's history and exam and pertinent patient test results.  I agree with the assessment, diagnosis, and plan of care documented in the resident's note.  

## 2021-08-19 ENCOUNTER — Encounter: Payer: Self-pay | Admitting: Cardiovascular Disease

## 2021-08-21 ENCOUNTER — Other Ambulatory Visit (HOSPITAL_COMMUNITY): Payer: Self-pay

## 2021-08-22 ENCOUNTER — Other Ambulatory Visit (HOSPITAL_COMMUNITY): Payer: Self-pay

## 2021-09-01 ENCOUNTER — Other Ambulatory Visit (HOSPITAL_COMMUNITY): Payer: Self-pay

## 2021-09-10 ENCOUNTER — Telehealth: Payer: Self-pay | Admitting: Cardiovascular Disease

## 2021-09-10 ENCOUNTER — Encounter: Payer: Self-pay | Admitting: Student

## 2021-09-10 ENCOUNTER — Ambulatory Visit (INDEPENDENT_AMBULATORY_CARE_PROVIDER_SITE_OTHER): Payer: Commercial Managed Care - HMO | Admitting: Student

## 2021-09-10 ENCOUNTER — Other Ambulatory Visit (HOSPITAL_COMMUNITY): Payer: Self-pay

## 2021-09-10 VITALS — BP 133/71 | HR 52 | Temp 97.7°F | Wt 188.6 lb

## 2021-09-10 DIAGNOSIS — F17219 Nicotine dependence, cigarettes, with unspecified nicotine-induced disorders: Secondary | ICD-10-CM

## 2021-09-10 DIAGNOSIS — E782 Mixed hyperlipidemia: Secondary | ICD-10-CM

## 2021-09-10 DIAGNOSIS — M25512 Pain in left shoulder: Secondary | ICD-10-CM | POA: Insufficient documentation

## 2021-09-10 DIAGNOSIS — I10 Essential (primary) hypertension: Secondary | ICD-10-CM | POA: Diagnosis not present

## 2021-09-10 DIAGNOSIS — R55 Syncope and collapse: Secondary | ICD-10-CM

## 2021-09-10 DIAGNOSIS — Z0000 Encounter for general adult medical examination without abnormal findings: Secondary | ICD-10-CM

## 2021-09-10 DIAGNOSIS — L729 Follicular cyst of the skin and subcutaneous tissue, unspecified: Secondary | ICD-10-CM

## 2021-09-10 DIAGNOSIS — M1611 Unilateral primary osteoarthritis, right hip: Secondary | ICD-10-CM

## 2021-09-10 DIAGNOSIS — Z1211 Encounter for screening for malignant neoplasm of colon: Secondary | ICD-10-CM

## 2021-09-10 DIAGNOSIS — E559 Vitamin D deficiency, unspecified: Secondary | ICD-10-CM

## 2021-09-10 DIAGNOSIS — F17210 Nicotine dependence, cigarettes, uncomplicated: Secondary | ICD-10-CM

## 2021-09-10 HISTORY — DX: Pain in left shoulder: M25.512

## 2021-09-10 MED ORDER — DICLOFENAC SODIUM 1 % EX GEL
4.0000 g | Freq: Two times a day (BID) | CUTANEOUS | 2 refills | Status: DC | PRN
  Filled 2021-09-10: qty 100, 12d supply, fill #0

## 2021-09-10 NOTE — Assessment & Plan Note (Signed)
-   Dermatology referral placed.  Patient has insurance now

## 2021-09-10 NOTE — Assessment & Plan Note (Addendum)
Pressure well controlled at 133/71.  He has been taking carvedilol 3.125 mg twice daily since last visit.  His heart rate is on the lower side of 58.  -Continue carvedilol 3.125 mg twice daily. -He does have CAD with reduced EF 40% but blood pressure does not allow addition of other GDMT.

## 2021-09-10 NOTE — Assessment & Plan Note (Signed)
Patient continues smoking but is cutting back to half a pack a day.  Used to smoke 1 pack a day.  He declined nicotine patch or gum.  He does not want to take medications for tobacco cessation.  Patient understands the risk of smoking and his CAD/PAD.

## 2021-09-10 NOTE — Assessment & Plan Note (Signed)
-   Fit test ordered for colon cancer screening 

## 2021-09-10 NOTE — Telephone Encounter (Signed)
Caller is requesting patient have a sooner appointment due to pre-syncopal episodes, slightly cardiac.

## 2021-09-10 NOTE — Patient Instructions (Addendum)
Mr. Manthei,  It was nice seeing you in the clinic today.  Here is a summary what we talked about:  1.  Blood pressure is well controlled today.  Please continue carvedilol 2 times a day.  2.  Please continue taking atorvastatin 80 mg daily.  I will recheck your cholesterol numbers today.  If your bad cholesterol is still higher than 70, I will talk to you about adding another medication.  3.  Please contact Dr. Kennon Holter office for an earlier appointment.  4.  Right hip arthritis: Please continue Tylenol as needed for pain.  I added Voltaren gel as well.  I placed a referral for physical therapy and orthopedic doctor for your arthritis.  You can also see them for your left shoulder pain as well.  5.  I placed a referral to dermatology for your cyst removal.  6.  Colon cancer screening: Please use our kit for stool collection.  Return in 3 months, sooner if needed  Take care,  Dr. Alfonse Spruce

## 2021-09-10 NOTE — Assessment & Plan Note (Signed)
Patient endorses mild left shoulder pain that started 1 week ago.  He has normal range of motion of the shoulder except for shoulder extension with elbow flexion.  He denies trauma or heavy lifting recently.  Denies any erythema or joint swelling.  He is taking Tylenol with relief.  Physical exam showed normal appearance of the left shoulder without erythema or effusion.  He has good range of motion of the shoulder joint except for shoulder extension with elbow flexed.  There is mild tenderness to palpation at the [facility] joint.  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  Low suspicion for adhesive capsulitis, infectious or rotator cuff tear in the absence of trauma.  -Continue Tylenol and Voltaren gel -Follow up with PT and orthopedic

## 2021-09-10 NOTE — Assessment & Plan Note (Signed)
Patient report adherence to atorvastatin 80 mg daily.  Denies any side effects including myalgia.  Last LDL was 146.  -Recheck lipid panel today.  Goal LDL < 70 in his CAD and PAD.  If LDL not at goal, will need to add Zetia.
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
## 2021-09-10 NOTE — Assessment & Plan Note (Addendum)
Right hip x-ray was negative for fracture or dislocation.  It showed degenerative changes of his right hip.  It looks like patient also have left hip degenerative changes as well.  -Patient agrees with physical therapy referral.   -He will continue taking Tylenol for pain.  I added Voltaren gel as well.  Avoid oral NSAIDs due to CAD and PAD. -Patient would like a referral to orthopedic for possible steroid injection.  Referral placed. -Check vitamin D

## 2021-09-10 NOTE — Assessment & Plan Note (Addendum)
Patient did not receive an earlier appointment with Dr. Benay Spice office.  I reached out to Dr. Allyson Sabal in July and he is aware of patient's presyncopal episodes.  Patient endorses ongoing symptoms of palpitation, lightheadedness, shortness of breath and chest tightness.  His most recent episode was today where his main symptom was weakness.  -Advised patient to reach out to Dr. Hazle Coca office for an earlier appointment.  He will need a 30 days cardiac monitor. -I called Dr. Hazle Coca office to ask for an early appt for patient.

## 2021-09-10 NOTE — Telephone Encounter (Signed)
Returned call to pt offered him an appt 09-16-21 he states that he will call back and let us know if he was available for this appt.

## 2021-09-11 LAB — LIPID PANEL
Chol/HDL Ratio: 9.6 ratio — ABNORMAL HIGH (ref 0.0–5.0)
Cholesterol, Total: 230 mg/dL — ABNORMAL HIGH (ref 100–199)
HDL: 24 mg/dL — ABNORMAL LOW (ref >39)
LDL Chol Calc (NIH): 148 mg/dL — ABNORMAL HIGH (ref 0–99)
Triglycerides: 312 mg/dL — ABNORMAL HIGH (ref 0–149)
VLDL Cholesterol Cal: 58 mg/dL — ABNORMAL HIGH (ref 5–40)

## 2021-09-11 LAB — VITAMIN D 25 HYDROXY (VIT D DEFICIENCY, FRACTURES): Vit D, 25-Hydroxy: 11.2 ng/mL — ABNORMAL LOW (ref 30.0–100.0)

## 2021-09-12 ENCOUNTER — Other Ambulatory Visit (HOSPITAL_COMMUNITY): Payer: Self-pay

## 2021-09-12 MED ORDER — VITAMIN D (ERGOCALCIFEROL) 1.25 MG (50000 UNIT) PO CAPS
50000.0000 [IU] | ORAL_CAPSULE | ORAL | 0 refills | Status: DC
  Filled 2021-09-12: qty 8, 56d supply, fill #0

## 2021-09-12 NOTE — Addendum Note (Signed)
Addended byDoran Stabler on: 09/12/2021 02:15 PM   Modules accepted: Orders

## 2021-09-16 ENCOUNTER — Ambulatory Visit: Payer: Commercial Managed Care - HMO | Attending: Cardiovascular Disease | Admitting: Cardiovascular Disease

## 2021-09-16 ENCOUNTER — Encounter: Payer: Self-pay | Admitting: Cardiovascular Disease

## 2021-09-16 DIAGNOSIS — F17219 Nicotine dependence, cigarettes, with unspecified nicotine-induced disorders: Secondary | ICD-10-CM

## 2021-09-16 DIAGNOSIS — I10 Essential (primary) hypertension: Secondary | ICD-10-CM

## 2021-09-16 DIAGNOSIS — Z951 Presence of aortocoronary bypass graft: Secondary | ICD-10-CM

## 2021-09-16 DIAGNOSIS — R55 Syncope and collapse: Secondary | ICD-10-CM

## 2021-09-16 DIAGNOSIS — I480 Paroxysmal atrial fibrillation: Secondary | ICD-10-CM

## 2021-09-16 DIAGNOSIS — E782 Mixed hyperlipidemia: Secondary | ICD-10-CM

## 2021-09-16 DIAGNOSIS — I739 Peripheral vascular disease, unspecified: Secondary | ICD-10-CM | POA: Diagnosis not present

## 2021-09-16 DIAGNOSIS — I6523 Occlusion and stenosis of bilateral carotid arteries: Secondary | ICD-10-CM

## 2021-09-16 NOTE — Assessment & Plan Note (Signed)
History of CAD status post left main stenting in [facility] with repeat cardiac catheterization performed Dr. 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  He underwent CABG x3 by Dr. [name] 04/07/2017 with a [name] to his LAD, vein to an [name] and 2.  He denies chest pain.

## 2021-09-16 NOTE — Patient Instructions (Signed)
Medication Instructions:  Your physician recommends that you continue on your current medications as directed. Please refer to the Current Medication list given to you today.  *If you need a refill on your cardiac medications before your next appointment, please call your pharmacy*   Testing/Procedures: [employer] Cardiac Event Monitor Instructions Your physician has requested you wear your cardiac event monitor for 30 days. [employer] may call or text to confirm a shipping address. The monitor will be sent to a land address via UPS. [employer] will not ship a monitor to a PO BOX. It typically takes 3-5 days to receive your monitor after it has been enrolled. [employer] will assist with USPS tracking if your package is delayed. The telephone number for [employer] is [phone]. Once you have received your monitor, please review the enclosed instructions. Instruction tutorials can also be viewed under help and settings on the enclosed cell phone. Your monitor has already been registered assigning a specific monitor serial # to you.  Applying the monitor Remove cell phone from case and turn it on. The cell phone works as Dealer and needs to be within [employer] of you at all times. The cell phone will need to be charged on a daily basis. We recommend you plug the cell phone into the enclosed charger at your bedside table every night.  Monitor batteries: You will receive two monitor batteries labelled #1 and #2. These are your recorders. Plug battery #2 onto the second connection on the enclosed charger. Keep one battery on the charger at all times. This will keep the monitor battery deactivated. It will also keep it fully charged for when you need to switch your monitor batteries. A small light will be blinking on the battery emblem when it is charging. The light on the battery emblem will remain on when the battery is fully charged.  Open package of a Monitor strip. Insert  battery #1 into black hood on strip and gently squeeze monitor battery onto connection as indicated in instruction booklet. Set aside while preparing skin.  Choose location for your strip, vertical or horizontal, as indicated in the instruction booklet. Shave to remove all hair from location. There cannot be any lotions, oils, powders, or colognes on skin where monitor is to be applied. Wipe skin clean with enclosed Saline wipe. Dry skin completely.  Peel paper labeled #1 off the back of the Monitor strip exposing the adhesive. Place the monitor on the chest in the vertical or horizontal position shown in the instruction booklet. One arrow on the monitor strip must be pointing upward. Carefully remove paper labeled #2, attaching remainder of strip to your skin. Try not to create any folds or wrinkles in the strip as you apply it.  Firmly press and release the circle in the center of the monitor battery. You will hear a small beep. This is turning the monitor battery on. The heart emblem on the monitor battery will light up every 5 seconds if the monitor battery in turned on and connected to the patient securely. Do not push and hold the circle down as this turns the monitor battery off. The cell phone will locate the monitor battery. A screen will appear on the cell phone checking the connection of your monitor strip. This may read poor connection initially but change to good connection within the next minute. Once your monitor accepts the connection you will hear a series of 3 beeps followed by a climbing crescendo of beeps. A screen will appear  on the cell phone showing the two monitor strip placement options. Touch the picture that demonstrates where you applied the monitor strip.  Your monitor strip and battery are waterproof. You are able to shower, bathe, or swim with the monitor on. They just ask you do not submerge deeper than 3 feet underwater. We recommend removing the monitor if you  are swimming in a lake, river, or ocean.  Your monitor battery will need to be switched to a fully charged monitor battery approximately once a week. The cell phone will alert you of an action which needs to be made.  On the cell phone, tap for details to reveal connection status, monitor battery status, and cell phone battery status. The green dots indicates your monitor is in good status. A red dot indicates there is something that needs your attention.  To record a symptom, click the circle on the monitor battery. In 30-60 seconds a list of symptoms will appear on the cell phone. Select your symptom and tap save. Your monitor will record a sustained or significant arrhythmia regardless of you clicking the button. Some patients do not feel the heart rhythm irregularities. [employer] will notify us of any serious or critical events.  Refer to instruction booklet for instructions on switching batteries, changing strips, the Do not disturb or Pause features, or any additional questions.  Call [employer] at [phone], to confirm your monitor is transmitting and record your baseline. They will answer any questions you may have regarding the monitor instructions at that time.  Returning the monitor to [employer] Place all equipment back into blue box. Peel off strip of paper to expose adhesive and close box securely. There is a prepaid UPS shipping label on this box. Drop in a UPS drop box, or at a UPS facility like [employer]. You may also contact [employer] to arrange UPS to pick up monitor package at your home.   Dr. Allyson Sabal has recommended that you have an Ultrasound of your AORTA/IVC/ILIACS.   To prepare for this test:  No food after 11PM the night before. Water is OK. (Don't drink liquids if you have been instructed not to for ANOTHER test).  Avoid foods that produce bowel gas, for 24 hours prior to exam (see below). No breakfast, no chewing gum, no smoking or carbonated  beverages. Patient may take morning medications with water. Come in for test at least 15 minutes early to register. To be done in April 2024. This procedure will be done at [address] [facility]  Your physician has requested that you have an ankle brachial index (ABI). During this test an ultrasound and blood pressure cuff are used to evaluate the arteries that supply the arms and legs with blood. Allow thirty minutes for this exam. There are no restrictions or special instructions. To be done in April 2024. This procedure will be done at [address] [facility]  Your physician has requested that you have a carotid duplex. This test is an ultrasound of the carotid arteries in your neck. It looks at blood flow through these arteries that supply the brain with blood. Allow one hour for this exam. There are no restrictions or special instructions. To be done in April 2024. This procedure will be done at [address] [facility]     Follow-Up: At [facility], you and your health needs are our priority.  As part of our continuing mission to provide you with exceptional heart care, we have created designated  Provider Care Teams.  These Care Teams include your primary Cardiologist (physician) and Advanced Practice Providers (APPs -  Physician Assistants and Nurse Practitioners) who all work together to provide you with the care you need, when you need it.  We recommend signing up for the patient portal called "MyChart".  Sign up information is provided on this After Visit Summary.  MyChart is used to connect with patients for Virtual Visits (Telemedicine).  Patients are able to view lab/test results, encounter notes, upcoming appointments, etc.  Non-urgent messages can be sent to your provider as well.   To learn more about what you can do with MyChart, go to [email or web address].    Your next appointment:   2 month(s)  The format for your next appointment:   In  Person  Provider:   Edd Fabian, FNP       Then, Nanetta Batty, MD will plan to see you again in 6 month(s).

## 2021-09-16 NOTE — Progress Notes (Signed)
09/16/2021 [name] [name]   04/13/1970  [id number]  Primary Physician Doran Stabler, DO Primary Cardiologist: [name] [name]  HPI:  [name] [name] is a 51 y.o.  engaged Caucasian male (fiancs name is [name] [name]) father of one 69 year old son who is referred by [name] [name] registered ,nurse practitioner, for peripheral vascular evaluation. I last saw him in the 11/12/2020.  He is accompanied by his daughter [name] [name] today.  He has a history of acute tobacco abuse having smoked 40-60 pack years, treated hypertension and hyperlipidemia. She has never had a heart attack or stroke and denies chest pain or shortness of breath. He saw [name]  years ago and ultimately underwent angiography/ 05/16/13 revealing bilateral iliac disease both of which were stented with balloon expandable and self expanding stents. He enjoyed symptomatic relief for approximately one year and then developed recurrent left calf claudication. Recent Dopplers performed 05/02/15 revealed a left ABI 0.78 with a high-frequency signal in his left external iliac artery. I performed lower extremity angiography on him 06/06/15 revealing a patent left common and external iliac artery stent with a 95% stenosis at the distal edge of the proximal stent which I restented with a 9 mm x 3 cm nitinol self expanding stent. His symptoms of claudication have resolved and his Dopplers have normalized. He has in addition stop smoking by his account. He had a left main stent placed in [facility] August of last year and underwent repeat cardiac catheterization by Dr. 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  He underwent coronary artery bypass grafting x3 by Dr. [name] 04/07/2017 with a [name] to the LAD and vein to [name] and 2.  He is done well since.  He has stopped smoking.     I performed peripheral angiography and intervention on him 11/07/2017 with stenting of his right common  iliac artery with a 7 mm x 29 mm long balloon expandable stent.  The previous stent I placed in his left leg were widely patent angiographically.  His follow-up Doppler studies normalized and his claudication has resolved.   He had developed progressive right lower extremity claudication with Doppler studies performed 05/12/2019 revealing a decline in his right ABI to 0.7 with high-frequency signals in his right common and external iliac artery suggesting progression of disease and/or in-stent restenosis.  He does continue to smoke.  Unfortunately, he was out of all of his medicines for 8 months up until recently when he restarted those.  He did have a 2D echo performed 05/09/2019 revealing slightly lower EF than in the past in the 45 to 50% range with an inferoapical Hagey motion abnormality.   I performed peripheral angiography on him 05/29/2019 revealing high-grade disease in his right common extra iliac artery both of which were restented.  He had a VBX stent placed in his right common iliac artery and a nitinol self-expanding stent in his external iliac artery.  His right ABI increased from 0.7 up to 0.98.  His claudication has resolved.   Since I saw him in the office 10 months ago he denies chest pain or shortness of breath.  His major new issue is now presyncope which began 3 to 4 months ago and occurs every several weeks.  He is actually never passed out but does get symptoms of feeling lightheaded with dark vision.  Current Meds  Medication Sig   aspirin EC 81 MG tablet Take 1 tablet (  81 mg total) by mouth daily.   atorvastatin (LIPITOR) 80 MG tablet Take 1 tablet (80 mg total) by mouth at bedtime.   carvedilol (COREG) 3.125 MG tablet Take 1 tablet (3.125 mg total) by mouth 2 (two) times daily.   Vitamin D, Ergocalciferol, (DRISDOL) 1.25 MG (50000 UNIT) CAPS capsule Take 1 capsule (50,000 Units total) by mouth every 7 (seven) days.     Allergies  Allergen Reactions   Codeine Other (See  Comments)    Migraine     Social History   Socioeconomic History   Marital status: Divorced    Spouse name: Not on file   Number of children: Not on file   Years of education: Not on file   Highest education level: Not on file  Occupational History   Occupation: [employer] doctor  Tobacco Use   Smoking status: Some Days    Packs/day: 0.75    Years: 30.00    Total pack years: 22.50    Types: Cigarettes    Last attempt to quit: 06/07/2015    Years since quitting: 6.2   Smokeless tobacco: Former    Types: Snuff   Tobacco comments:    "used snuff in my teens"  Vaping Use   Vaping Use: Former  Substance and Sexual Activity   Alcohol use: Yes    Alcohol/week: 0.0 standard drinks of alcohol    Comment: 06/06/2015 "might have 1 beer/month"   Drug use: No   Sexual activity: Not Currently  Other Topics Concern   Not on file  Social History Narrative   Not on file   Social Determinants of Health   Financial Resource Strain: High Risk (12/12/2019)   Overall Financial Resource Strain (CARDIA)    Difficulty of Paying Living Expenses: Very hard  Food Insecurity: No Food Insecurity (12/12/2019)   Hunger Vital Sign    Worried About Running Out of Food in the Last Year: Never true    Ran Out of Food in the Last Year: Never true  Transportation Needs: No Transportation Needs (12/12/2019)   PRAPARE - Administrator, Civil Service (Medical): No    Lack of Transportation (Non-Medical): No  Physical Activity: Not on file  Stress: Not on file  Social Connections: Not on file  Intimate Partner Violence: Not on file     Review of Systems: General: negative for chills, fever, night sweats or weight changes.  Cardiovascular: negative for chest pain, dyspnea on exertion, edema, orthopnea, palpitations, paroxysmal nocturnal dyspnea or shortness of breath Dermatological: negative for rash Respiratory: negative for cough or wheezing Urologic: negative for hematuria Abdominal:  negative for nausea, vomiting, diarrhea, bright red blood per rectum, melena, or hematemesis Neurologic: negative for visual changes, syncope, or dizziness All other systems reviewed and are otherwise negative except as noted above.    Blood pressure 110/80, pulse 61, height 5\' 9"  (1.753 m), weight 185 lb (83.9 kg), SpO2 97 %.  General appearance: alert and no distress Neck: no adenopathy, no JVD, supple, symmetrical, trachea midline, thyroid not enlarged, symmetric, no tenderness/mass/nodules, and soft bilateral carotid bruits Lungs: clear to auscultation bilaterally Heart: regular rate and rhythm, S1, S2 normal, no murmur, click, rub or gallop Extremities: extremities normal, atraumatic, no cyanosis or edema Pulses: 2+ and symmetric Skin: Skin color, texture, turgor normal. No rashes or lesions Neurologic: Grossly normal  EKG sinus rhythm at 61 with septal Q waves.  Personally reviewed this EKG.  ASSESSMENT AND PLAN:   Nicotine dependence History of tobacco abuse  currently smoking 1/2 pack/day and using a nicotine pouch  Claudication in peripheral vascular disease (HCC) History of peripheral arterial disease status post bilateral iliac stenting by Dr. Jacinto Halim in 2015.  I performed multiple interventions since most recently 2019 with stenting of his right common iliac artery with a 7 mm x 29 mm long balloon expandable stent, and 05/29/2019 right restented both iliac arteries.  His ABIs had increased from 0.7-0.98 and his claudication resolved after that.  His most recent Dopplers performed/10/23 revealed patent iliac stents.  HLD (hyperlipidemia) History of hyperlipidemia on high-dose atorvastatin with lipid profile performed by his PCP 09/10/2021 revealed a total cholesterol 230, LDL of 148 and HDL 24.  I am going to have one of our Pharm.D. speak to him about Repatha.  Essential hypertension History of essential hypertension with blood pressure measured at 110/80.  He is on  carvedilol  S/P CABG x 3 History of CAD status post left main stenting in [facility] with repeat cardiac catheterization performed Dr. 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  He underwent CABG x3 by Dr. [name] 04/07/2017 with a [name] to his LAD, vein to an [name] and 2.  He denies chest pain.  Paroxysmal atrial fibrillation (HCC) History of PAF in sinus rhythm today  Carotid artery disease (HCC) History of carotid artery disease with moderate bilateral ICA stenosis by duplex ultrasound performed/10/23.  This will be repeated in 1 year.  Pre-syncope Patient has complained of presyncope over the last 3 to 4 months occurring every 3 weeks or so.  He gets a feeling of going to pass out and he gets down on his hands and knees.  Things become black but he never actually loses consciousness.  Is unclear the etiology.  I am going to get a 30-day event monitor to further evaluate     [name] [facility], [facility] 09/16/2021 11:48 AM

## 2021-09-16 NOTE — Assessment & Plan Note (Addendum)
History of tobacco abuse currently smoking 1/2 pack/day and using a nicotine pouch

## 2021-09-16 NOTE — Assessment & Plan Note (Signed)
History of peripheral arterial disease status post bilateral iliac stenting by Dr. Jacinto Halim in 2015.  I performed multiple interventions since most recently 2019 with stenting of his right common iliac artery with a 7 mm x 29 mm long balloon expandable stent, and 05/29/2019 right restented both iliac arteries.  His ABIs had increased from 0.7-0.98 and his claudication resolved after that.  His most recent Dopplers performed/10/23 revealed patent iliac stents.

## 2021-09-16 NOTE — Assessment & Plan Note (Signed)
Patient has complained of presyncope over the last 3 to 4 months occurring every 3 weeks or so.  He gets a feeling of going to pass out and he gets down on his hands and knees.  Things become black but he never actually loses consciousness.  Is unclear the etiology.  I am going to get a 30-day event monitor to further evaluate

## 2021-09-16 NOTE — Assessment & Plan Note (Signed)
History of carotid artery disease with moderate bilateral ICA stenosis by duplex ultrasound performed/10/23.  This will be repeated in 1 year.

## 2021-09-16 NOTE — Assessment & Plan Note (Signed)
History of essential hypertension with blood pressure measured at 110/80.  He is on carvedilol

## 2021-09-16 NOTE — Assessment & Plan Note (Signed)
History of PAF in sinus rhythm today

## 2021-09-16 NOTE — Assessment & Plan Note (Signed)
History of hyperlipidemia on high-dose atorvastatin with lipid profile performed by his PCP 09/10/2021 revealed a total cholesterol 230, LDL of 148 and HDL 24.  I am going to have one of our Pharm.D. speak to him about Repatha.

## 2021-09-17 ENCOUNTER — Encounter: Payer: Self-pay | Admitting: *Deleted

## 2021-09-17 NOTE — Progress Notes (Signed)
Internal Medicine Clinic Attending  Case discussed with Dr. Nguyen  at the time of the visit.  We reviewed the resident's history and exam and pertinent patient test results.  I agree with the assessment, diagnosis, and plan of care documented in the resident's note.  

## 2021-09-17 NOTE — Progress Notes (Signed)
Patient ID: [name] [name], male   DOB: 08-Oct-1970, 51 y.o.   MRN: [id number] Patient enrolled for [employer] to ship a 30 day cardiac event monitor to his address on file.

## 2021-09-29 ENCOUNTER — Ambulatory Visit: Payer: Commercial Managed Care - HMO | Attending: Cardiovascular Disease

## 2021-09-29 DIAGNOSIS — E782 Mixed hyperlipidemia: Secondary | ICD-10-CM

## 2021-09-29 DIAGNOSIS — I10 Essential (primary) hypertension: Secondary | ICD-10-CM

## 2021-09-29 DIAGNOSIS — R55 Syncope and collapse: Secondary | ICD-10-CM | POA: Diagnosis not present

## 2021-09-29 DIAGNOSIS — I6523 Occlusion and stenosis of bilateral carotid arteries: Secondary | ICD-10-CM

## 2021-09-29 DIAGNOSIS — F17219 Nicotine dependence, cigarettes, with unspecified nicotine-induced disorders: Secondary | ICD-10-CM

## 2021-09-29 DIAGNOSIS — I480 Paroxysmal atrial fibrillation: Secondary | ICD-10-CM | POA: Diagnosis not present

## 2021-09-29 DIAGNOSIS — I739 Peripheral vascular disease, unspecified: Secondary | ICD-10-CM

## 2021-09-29 DIAGNOSIS — Z951 Presence of aortocoronary bypass graft: Secondary | ICD-10-CM

## 2021-10-01 ENCOUNTER — Ambulatory Visit: Payer: Commercial Managed Care - HMO | Admitting: Orthopaedic Surgery

## 2021-10-01 DIAGNOSIS — M1611 Unilateral primary osteoarthritis, right hip: Secondary | ICD-10-CM
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
## 2021-10-03 ENCOUNTER — Encounter: Payer: Self-pay | Admitting: Sports Medicine

## 2021-10-03 ENCOUNTER — Ambulatory Visit: Payer: Self-pay

## 2021-10-03 ENCOUNTER — Ambulatory Visit: Payer: Commercial Managed Care - HMO | Admitting: Sports Medicine

## 2021-10-03 VITALS — BP 160/78 | HR 89 | Ht 69.0 in | Wt 189.0 lb

## 2021-10-03 DIAGNOSIS — M25551 Pain in right hip: Secondary | ICD-10-CM

## 2021-10-03 NOTE — Progress Notes (Signed)
   Procedure Note  Patient: [name] [name]             Date of Birth: Mar 30, 1970           MRN: [id number]             Visit Date: 10/03/2021  Procedures: Visit Diagnoses:  1. Pain in right hip    Procedure: US-guided intra-articular hip injection, right After discussion on risks/benefits/indications and informed verbal consent was obtained, a timeout was performed. Patient was lying supine on exam table. The hip was cleaned with betadine and alcohol swabs. Then utilizing ultrasound guidance, the patient's femoral head and neck junction was identified and subsequently injected with 4:2 lidocaine:depomedrol via an in-plane approach with ultrasound visualization of the injectate administered into the hip joint. Patient tolerated procedure well without immediate complications.  - I evaluated the patient about 10 minutes post-injection and they had improvement in pain and range of motion; reports she felt like the hip was "less rusty" - follow-up with Dr. Roda Shutters as indicated; I am happy to see them as needed  Madelyn Brunner, DO Primary Care Sports Medicine Physician  [facility]  This note was dictated using Dragon naturally speaking software and may contain errors in syntax, spelling, or content which have not been identified prior to signing this note.

## 2021-10-09 ENCOUNTER — Other Ambulatory Visit (HOSPITAL_COMMUNITY): Payer: Self-pay

## 2021-10-09 ENCOUNTER — Encounter: Payer: Self-pay | Admitting: Pharmacist

## 2021-10-09 ENCOUNTER — Telehealth: Payer: Self-pay | Admitting: Pharmacist

## 2021-10-09 ENCOUNTER — Ambulatory Visit: Payer: Commercial Managed Care - HMO | Attending: Cardiology | Admitting: Pharmacist

## 2021-10-09 DIAGNOSIS — I10 Essential (primary) hypertension: Secondary | ICD-10-CM

## 2021-10-09 DIAGNOSIS — F17219 Nicotine dependence, cigarettes, with unspecified nicotine-induced disorders: Secondary | ICD-10-CM

## 2021-10-09 DIAGNOSIS — I6523 Occlusion and stenosis of bilateral carotid arteries: Secondary | ICD-10-CM

## 2021-10-09 DIAGNOSIS — Z951 Presence of aortocoronary bypass graft: Secondary | ICD-10-CM

## 2021-10-09 DIAGNOSIS — I25810 Atherosclerosis of coronary artery bypass graft(s) without angina pectoris: Secondary | ICD-10-CM

## 2021-10-09 DIAGNOSIS — E782 Mixed hyperlipidemia: Secondary | ICD-10-CM

## 2021-10-09 MED ORDER — REPATHA SURECLICK 140 MG/ML ~~LOC~~ SOAJ
1.0000 mL | SUBCUTANEOUS | 11 refills | Status: DC
  Filled 2021-10-09: qty 2, 28d supply, fill #0
  Filled 2021-11-05 (×2): qty 2, 28d supply, fill #1
  Filled 2022-03-13 – 2022-09-16 (×3): qty 2, 28d supply, fill #2

## 2021-10-09 NOTE — Addendum Note (Signed)
Addended by: Rollen Sox on: 10/09/2021 02:32 PM   Modules accepted: Orders
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
## 2021-10-09 NOTE — Patient Instructions (Signed)
It was nice meeting you two today  Please continue your atorvastatin 80mg  once a day  We would like to start you on a new medication called Repatha, which you will inject once every 2 weeks  I will complete the prior authorization for you and contact you when it is approved  Once you start the medication we will recheck your cholesterol in about 2-3 months  Continue to work on reducing your smoking  Please call with any questions  Karren Cobble, PharmD, [place], [place], [address], [place], [place], [address] Phone: [phone], Fax: [phone]

## 2021-10-09 NOTE — Telephone Encounter (Signed)
PA for Repatha submitted.  Key: [id number]

## 2021-10-09 NOTE — Telephone Encounter (Signed)
PA approved through 10/09/22

## 2021-10-10 ENCOUNTER — Other Ambulatory Visit (HOSPITAL_COMMUNITY): Payer: Self-pay
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
## 2021-10-15 ENCOUNTER — Ambulatory Visit: Payer: Commercial Managed Care - HMO | Attending: Internal Medicine | Admitting: Physical Therapy

## 2021-10-15 ENCOUNTER — Other Ambulatory Visit: Payer: Self-pay

## 2021-10-15 ENCOUNTER — Encounter: Payer: Self-pay | Admitting: Physical Therapy

## 2021-10-15 DIAGNOSIS — M1611 Unilateral primary osteoarthritis, right hip: Secondary | ICD-10-CM | POA: Insufficient documentation

## 2021-10-15 DIAGNOSIS — M6281 Muscle weakness (generalized): Secondary | ICD-10-CM | POA: Diagnosis present

## 2021-10-15 DIAGNOSIS — M25651 Stiffness of right hip, not elsewhere classified: Secondary | ICD-10-CM | POA: Insufficient documentation

## 2021-10-15 DIAGNOSIS — M25551 Pain in right hip: Secondary | ICD-10-CM | POA: Diagnosis present
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
## 2021-10-21 ENCOUNTER — Encounter: Payer: Self-pay | Admitting: Physical Therapy

## 2021-10-21 ENCOUNTER — Ambulatory Visit: Payer: Commercial Managed Care - HMO | Attending: Internal Medicine | Admitting: Physical Therapy

## 2021-10-21 DIAGNOSIS — M25551 Pain in right hip: Secondary | ICD-10-CM | POA: Insufficient documentation

## 2021-10-21 DIAGNOSIS — M25651 Stiffness of right hip, not elsewhere classified: Secondary | ICD-10-CM | POA: Insufficient documentation

## 2021-10-21 DIAGNOSIS — M6281 Muscle weakness (generalized): Secondary | ICD-10-CM | POA: Diagnosis present
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
## 2021-10-24 ENCOUNTER — Ambulatory Visit: Payer: Commercial Managed Care - HMO | Admitting: Physical Therapy

## 2021-10-24 ENCOUNTER — Encounter: Payer: Self-pay | Admitting: Physical Therapy

## 2021-10-24 DIAGNOSIS — M25551 Pain in right hip: Secondary | ICD-10-CM

## 2021-10-24 DIAGNOSIS — M6281 Muscle weakness (generalized): Secondary | ICD-10-CM

## 2021-10-24 DIAGNOSIS — M25651 Stiffness of right hip, not elsewhere classified: Secondary | ICD-10-CM

## 2021-10-28 ENCOUNTER — Ambulatory Visit: Payer: Commercial Managed Care - HMO | Admitting: Physical Therapy

## 2021-10-28 ENCOUNTER — Encounter: Payer: Self-pay | Admitting: Physical Therapy

## 2021-10-28 DIAGNOSIS — M25551 Pain in right hip: Secondary | ICD-10-CM | POA: Diagnosis not present

## 2021-10-28 DIAGNOSIS — M25651 Stiffness of right hip, not elsewhere classified: Secondary | ICD-10-CM

## 2021-10-28 DIAGNOSIS — M6281 Muscle weakness (generalized): Secondary | ICD-10-CM
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
## 2021-10-29 ENCOUNTER — Ambulatory Visit: Payer: Commercial Managed Care - HMO | Attending: General Practice | Admitting: General Practice

## 2021-10-29 ENCOUNTER — Encounter: Payer: Self-pay | Admitting: General Practice

## 2021-10-29 VITALS — BP 110/76 | HR 62 | Ht 69.0 in | Wt 182.0 lb

## 2021-10-29 DIAGNOSIS — R55 Syncope and collapse: Secondary | ICD-10-CM

## 2021-10-29 DIAGNOSIS — E782 Mixed hyperlipidemia: Secondary | ICD-10-CM

## 2021-10-29 DIAGNOSIS — I25810 Atherosclerosis of coronary artery bypass graft(s) without angina pectoris: Secondary | ICD-10-CM

## 2021-10-29 DIAGNOSIS — I739 Peripheral vascular disease, unspecified: Secondary | ICD-10-CM | POA: Diagnosis not present

## 2021-10-29 DIAGNOSIS — F17219 Nicotine dependence, cigarettes, with unspecified nicotine-induced disorders: Secondary | ICD-10-CM

## 2021-10-29 DIAGNOSIS — I10 Essential (primary) hypertension: Secondary | ICD-10-CM

## 2021-10-29 NOTE — Patient Instructions (Signed)
Medication Instructions:  The current medical regimen is effective;  continue present plan and medications as directed. Please refer to the Current Medication list given to you today.  *If you need a refill on your cardiac medications before your next appointment, please call your pharmacy*   Lab Work: NONE If you have any lab test that is abnormal or we need to change your treatment, we will call you to review the results.  Other Instructions CONTINUE SMOKING CESSATION  CONTINUE TO DO PHYSICAL THERAPY AND THEIR EXERCISES AT HOME  INCREASE PHYSICAL ACTIVITY AS TOLERATED  Follow-Up: At [facility], you and your health needs are our priority.  As part of our continuing mission to provide you with exceptional heart care, we have created designated Provider Care Teams.  These Care Teams include your primary Cardiologist (physician) and Advanced Practice Providers (APPs -  Physician Assistants and Nurse Practitioners) who all work together to provide you with the care you need, when you need it.  We recommend signing up for the patient portal called "MyChart".  Sign up information is provided on this After Visit Summary.  MyChart is used to connect with patients for Virtual Visits (Telemedicine).  Patients are able to view lab/test results, encounter notes, upcoming appointments, etc.  Non-urgent messages can be sent to your provider as well.   To learn more about what you can do with MyChart, go to [email or web address].    Your next appointment:   AS SCHEDULED    The format for your next appointment:   In Person  Provider:   Quay Burow, MD    Important Information About Sugar

## 2021-10-30 ENCOUNTER — Ambulatory Visit: Payer: Commercial Managed Care - HMO | Admitting: Physical Therapy

## 2021-11-03 ENCOUNTER — Encounter: Payer: Self-pay | Admitting: Physical Therapy

## 2021-11-03 ENCOUNTER — Ambulatory Visit: Payer: Commercial Managed Care - HMO | Admitting: Physical Therapy

## 2021-11-03 DIAGNOSIS — M25651 Stiffness of right hip, not elsewhere classified: Secondary | ICD-10-CM

## 2021-11-03 DIAGNOSIS — M25551 Pain in right hip: Secondary | ICD-10-CM | POA: Diagnosis not present

## 2021-11-03 DIAGNOSIS — M6281 Muscle weakness (generalized): Secondary | ICD-10-CM
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
## 2021-11-05 ENCOUNTER — Other Ambulatory Visit (HOSPITAL_COMMUNITY): Payer: Self-pay

## 2021-11-05 ENCOUNTER — Ambulatory Visit: Payer: Medicaid Other | Admitting: Cardiovascular Disease

## 2021-11-05 ENCOUNTER — Encounter: Payer: Self-pay | Admitting: Physical Therapy

## 2021-11-05 ENCOUNTER — Ambulatory Visit: Payer: Commercial Managed Care - HMO | Admitting: Physical Therapy

## 2021-11-05 DIAGNOSIS — M25551 Pain in right hip: Secondary | ICD-10-CM | POA: Diagnosis not present

## 2021-11-05 DIAGNOSIS — M25651 Stiffness of right hip, not elsewhere classified: Secondary | ICD-10-CM

## 2021-11-05 DIAGNOSIS — M6281 Muscle weakness (generalized): Secondary | ICD-10-CM
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
## 2021-11-10 ENCOUNTER — Encounter: Payer: Self-pay | Admitting: Physical Therapy

## 2021-11-10 ENCOUNTER — Ambulatory Visit: Payer: Commercial Managed Care - HMO | Admitting: Physical Therapy

## 2021-11-10 DIAGNOSIS — M25551 Pain in right hip: Secondary | ICD-10-CM

## 2021-11-10 DIAGNOSIS — M25651 Stiffness of right hip, not elsewhere classified: Secondary | ICD-10-CM

## 2021-11-10 DIAGNOSIS — M6281 Muscle weakness (generalized): Secondary | ICD-10-CM

## 2021-11-12 ENCOUNTER — Ambulatory Visit: Payer: Commercial Managed Care - HMO | Admitting: Physical Therapy

## 2021-11-12 ENCOUNTER — Encounter: Payer: Self-pay | Admitting: Physical Therapy

## 2021-11-12 DIAGNOSIS — M25551 Pain in right hip: Secondary | ICD-10-CM

## 2021-11-12 DIAGNOSIS — M6281 Muscle weakness (generalized): Secondary | ICD-10-CM

## 2021-11-12 DIAGNOSIS — M25651 Stiffness of right hip, not elsewhere classified: Secondary | ICD-10-CM
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
## 2021-11-17 ENCOUNTER — Ambulatory Visit: Payer: Commercial Managed Care - HMO | Admitting: Physical Therapy
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
## 2021-11-19 ENCOUNTER — Encounter: Payer: Self-pay | Admitting: Physical Therapy

## 2021-11-19 ENCOUNTER — Ambulatory Visit: Payer: Commercial Managed Care - HMO | Attending: Internal Medicine | Admitting: Physical Therapy

## 2021-11-19 DIAGNOSIS — M25551 Pain in right hip: Secondary | ICD-10-CM

## 2021-11-19 DIAGNOSIS — M6281 Muscle weakness (generalized): Secondary | ICD-10-CM | POA: Diagnosis present

## 2021-11-19 DIAGNOSIS — M25651 Stiffness of right hip, not elsewhere classified: Secondary | ICD-10-CM | POA: Diagnosis present

## 2021-11-24 ENCOUNTER — Ambulatory Visit: Payer: Commercial Managed Care - HMO | Admitting: Physical Therapy

## 2021-11-24 ENCOUNTER — Encounter: Payer: Self-pay | Admitting: Physical Therapy

## 2021-11-24 DIAGNOSIS — M6281 Muscle weakness (generalized): Secondary | ICD-10-CM

## 2021-11-24 DIAGNOSIS — M25551 Pain in right hip: Secondary | ICD-10-CM

## 2021-11-24 DIAGNOSIS — M25651 Stiffness of right hip, not elsewhere classified: Secondary | ICD-10-CM
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
## 2021-11-26 ENCOUNTER — Ambulatory Visit: Payer: Commercial Managed Care - HMO | Admitting: Physical Therapy

## 2021-11-26 ENCOUNTER — Encounter: Payer: Self-pay | Admitting: Physical Therapy

## 2021-11-26 DIAGNOSIS — M25651 Stiffness of right hip, not elsewhere classified: Secondary | ICD-10-CM

## 2021-11-26 DIAGNOSIS — M25551 Pain in right hip: Secondary | ICD-10-CM | POA: Diagnosis not present

## 2021-11-26 DIAGNOSIS — M6281 Muscle weakness (generalized): Secondary | ICD-10-CM
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
## 2021-12-03 ENCOUNTER — Encounter: Payer: Self-pay | Admitting: Physical Therapy

## 2021-12-03 ENCOUNTER — Ambulatory Visit: Payer: Commercial Managed Care - HMO | Admitting: Physical Therapy

## 2021-12-03 DIAGNOSIS — M25551 Pain in right hip: Secondary | ICD-10-CM

## 2021-12-03 DIAGNOSIS — M6281 Muscle weakness (generalized): Secondary | ICD-10-CM

## 2021-12-03 DIAGNOSIS — M25651 Stiffness of right hip, not elsewhere classified: Secondary | ICD-10-CM
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
## 2021-12-09 ENCOUNTER — Ambulatory Visit: Payer: Commercial Managed Care - HMO | Admitting: Physical Therapy

## 2022-01-06 ENCOUNTER — Other Ambulatory Visit: Payer: Self-pay

## 2022-01-15 ENCOUNTER — Other Ambulatory Visit (HOSPITAL_COMMUNITY): Payer: Self-pay

## 2022-01-15 ENCOUNTER — Encounter: Payer: Self-pay | Admitting: Student

## 2022-01-15 ENCOUNTER — Ambulatory Visit (INDEPENDENT_AMBULATORY_CARE_PROVIDER_SITE_OTHER): Payer: Commercial Managed Care - HMO | Admitting: Student

## 2022-01-15 VITALS — BP 133/95 | HR 68 | Temp 97.6°F | Ht 69.0 in | Wt 182.2 lb

## 2022-01-15 DIAGNOSIS — F17210 Nicotine dependence, cigarettes, uncomplicated: Secondary | ICD-10-CM | POA: Diagnosis not present

## 2022-01-15 DIAGNOSIS — E782 Mixed hyperlipidemia: Secondary | ICD-10-CM | POA: Diagnosis not present

## 2022-01-15 DIAGNOSIS — F17200 Nicotine dependence, unspecified, uncomplicated: Secondary | ICD-10-CM

## 2022-01-15 DIAGNOSIS — I25810 Atherosclerosis of coronary artery bypass graft(s) without angina pectoris: Secondary | ICD-10-CM | POA: Diagnosis not present

## 2022-01-15 DIAGNOSIS — I10 Essential (primary) hypertension: Secondary | ICD-10-CM | POA: Diagnosis not present

## 2022-01-15 DIAGNOSIS — M1611 Unilateral primary osteoarthritis, right hip: Secondary | ICD-10-CM

## 2022-01-15 MED ORDER — ATORVASTATIN CALCIUM 80 MG PO TABS
80.0000 mg | ORAL_TABLET | Freq: Every day | ORAL | 3 refills | Status: DC
  Filled 2022-01-15: qty 90, 90d supply, fill #0
  Filled 2022-03-13 – 2022-03-20 (×3): qty 30, 30d supply, fill #0

## 2022-01-15 MED ORDER — CARVEDILOL 3.125 MG PO TABS
3.1250 mg | ORAL_TABLET | Freq: Two times a day (BID) | ORAL | 11 refills | Status: DC
  Filled 2022-01-15 – 2022-03-20 (×3): qty 60, 30d supply, fill #0

## 2022-01-15 NOTE — Assessment & Plan Note (Signed)
Patient has significant PAD and CAD status post CABG and stents.  Last LDL was 146.  Patient reports taking aspirin, Lipitor 80 mg and Repatha.  York Spaniel his last shot of Repatha was 2 weeks ago.  Patient has not refilled any of his atorvastatin but stated that he has leftover from prior prescriptions.  We talked about the importance of lowering his cholesterol in the setting of extensive vasculopathy with stents.  I emphasized the portance of adhering to his medications including repatha.  Patient verbalizes understanding.  Patient states that Dr. Allyson Sabal will recheck his cholesterol next visit in February.  -Refill sent for his atorvastatin 80 mg

## 2022-01-15 NOTE — Assessment & Plan Note (Addendum)
Blood pressure is acceptable today but slightly higher than our ideal goal.  Patient is only taking Coreg 3.125 mg once a day due to difficulty adherence to this medication.  Patient declined using the pill packs.  After discussing the importance of Coreg for blood pressure control and for his CAD, patient agrees to take this medication twice daily.

## 2022-01-15 NOTE — Patient Instructions (Addendum)
[name] [name],  It was nice seeing you in the clinic today.  Here is a summary what we talked about:  1.  Your blood pressure is slightly higher than our goal < 130/80.  Please take your carvedilol twice daily.  I sent a new refill to the [facility] health outpatient pharmacy.  2.  Please continue aspirin, Lipitor and Repatha.  Like we discussed before, it is very important to keep your cholesterol down to avoid we blockage you have stents.  3.  Please try to cut back on smoking is much as you can.  If you are ready to quit, please call clinic and I will send you a prescription of either nicotine or Chantix.  4.  I will message the orthopedic doctor to make a follow-up appointment for you  5.  Please follow-up with Dr. Allyson Sabal in February  Please return in 6 months, sooner if needed.  Take care  Dr. Cyndie Chime
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
## 2022-01-15 NOTE — Assessment & Plan Note (Signed)
Patient last follow-up with orthopedic in September 2023 and received a steroid injection.  Patient states that the injection only lasted 3 days.  He is currently taking Tylenol 1 g a day which occasionally Aleve for breakthrough pain.  He has been going to physical therapy for 1 month but states that he did not feel the benefits.  He said that the feeling was also mutual from the PT side.  -I messaged Dr. Roda Shutters with orthopedic about follow-up appointment for possible viscosupplementation injection -Continue Tylenol for pain now.  Advised patient to use Aleve as sparingly as possible

## 2022-01-15 NOTE — Assessment & Plan Note (Signed)
Patient currently smoking 12 cigarettes a day.  I revisited the importance of smoking cessation for his CAD and PAD.  Patient verbalizes understanding and said that he is trying to cut back on his own.  He is the only smoker in the house.  He said that nicotine patch and gum did not help.  He declined Chantix because this medication gave him weird thoughts.  We also discussed possible starting Wellbutrin but he also declined.  Patient is made aware that if he changes his mind, he can reach out to our clinic for prescription of either nicotine patches or Chantix.

## 2022-01-16 NOTE — Progress Notes (Signed)
Internal Medicine Clinic Attending  Case discussed with Dr. Nguyen  At the time of the visit.  We reviewed the resident's history and exam and pertinent patient test results.  I agree with the assessment, diagnosis, and plan of care documented in the resident's note. 

## 2022-02-03 ENCOUNTER — Other Ambulatory Visit (HOSPITAL_COMMUNITY): Payer: Self-pay

## 2022-02-25 ENCOUNTER — Ambulatory Visit: Payer: Commercial Managed Care - HMO | Attending: Cardiovascular Disease | Admitting: Cardiovascular Disease

## 2022-02-25 ENCOUNTER — Encounter: Payer: Self-pay | Admitting: Cardiovascular Disease

## 2022-02-25 ENCOUNTER — Telehealth: Payer: Self-pay

## 2022-02-25 VITALS — BP 132/80 | HR 58 | Ht 69.0 in | Wt 180.0 lb

## 2022-02-25 DIAGNOSIS — F17200 Nicotine dependence, unspecified, uncomplicated: Secondary | ICD-10-CM | POA: Diagnosis not present

## 2022-02-25 DIAGNOSIS — F17219 Nicotine dependence, cigarettes, with unspecified nicotine-induced disorders: Secondary | ICD-10-CM

## 2022-02-25 DIAGNOSIS — Z951 Presence of aortocoronary bypass graft: Secondary | ICD-10-CM

## 2022-02-25 DIAGNOSIS — I739 Peripheral vascular disease, unspecified: Secondary | ICD-10-CM | POA: Diagnosis not present

## 2022-02-25 DIAGNOSIS — I6523 Occlusion and stenosis of bilateral carotid arteries: Secondary | ICD-10-CM

## 2022-02-25 DIAGNOSIS — Z0000 Encounter for general adult medical examination without abnormal findings: Secondary | ICD-10-CM

## 2022-02-25 DIAGNOSIS — E782 Mixed hyperlipidemia: Secondary | ICD-10-CM | POA: Diagnosis not present

## 2022-02-25 DIAGNOSIS — I10 Essential (primary) hypertension: Secondary | ICD-10-CM

## 2022-02-25 NOTE — Assessment & Plan Note (Signed)
History of essential hypertension blood pressure measured today at 132/80.  He is on carvedilol.

## 2022-02-25 NOTE — Assessment & Plan Note (Signed)
History of PAD status post multiple lower extremity interventions in the past beginning back in 2015.  His most recent peripheral angiogram performed 05/29/2019 revealed high-grade disease in his right common and extrailiac artery both of which were restented with a VBX covered stents as well as nitinol self-expanding stents.  His most recent lower extremity arterial Doppler study performed 126/23 revealed these to be widely patent.  He denies claudication.

## 2022-02-25 NOTE — Progress Notes (Signed)
02/25/2022 [name] [name]   [birth year]  [id number]  Primary Physician Gaylan Gerold, DO Primary Cardiologist: Lorretta Harp MD Renae Gloss  HPI:  [name] [name] is a 52 y.o.  engaged Caucasian male (fiancs name is Katharine Look) father of one 55 year old son who is referred by Webb Silversmith registered ,nurse practitioner, for peripheral vascular evaluation. I last saw him in the 09/16/2021.  He is accompanied by his daughter Caryl Pina today.  He has a history of acute tobacco abuse having smoked 40-60 pack years, treated hypertension and hyperlipidemia. She has never had a heart attack or stroke and denies chest pain or shortness of breath. He saw Dr. Nadyne Coombes  years ago and ultimately underwent angiography/ 05/16/13 revealing bilateral iliac disease both of which were stented with balloon expandable and self expanding stents. He enjoyed symptomatic relief for approximately one year and then developed recurrent left calf claudication. Recent Dopplers performed 05/02/15 revealed a left ABI 0.78 with a high-frequency signal in his left external iliac artery. I performed lower extremity angiography on him 06/06/15 revealing a patent left common and external iliac artery stent with a 95% stenosis at the distal edge of the proximal stent which I restented with a 9 mm x 3 cm nitinol self expanding stent. His symptoms of claudication have resolved and his Dopplers have normalized. He has in addition stop smoking by his account. He had a left main stent placed in [facility] August of last year and underwent repeat cardiac catheterization by Dr. Burt Knack in the setting of unstable angina 04/09/2017 revealing 80% "in-stent restenosis.  He underwent coronary artery bypass grafting x3 by Dr. Servando Snare 04/07/2017 with a [name] to the LAD and vein to [name] and 2.  He is done well since.  He has stopped smoking.     I performed peripheral angiography and intervention on him 11/07/2017 with stenting of his right common iliac  artery with a 7 mm x 29 mm long balloon expandable stent.  The previous stent I placed in his left leg were widely patent angiographically.  His follow-up Doppler studies normalized and his claudication has resolved.   He had developed progressive right lower extremity claudication with Doppler studies performed 05/12/2019 revealing a decline in his right ABI to 0.7 with high-frequency signals in his right common and external iliac artery suggesting progression of disease and/or in-stent restenosis.  He does continue to smoke.  Unfortunately, he was out of all of his medicines for 8 months up until recently when he restarted those.  He did have a 2D echo performed 05/09/2019 revealing slightly lower EF than in the past in the 45 to 50% range with an inferoapical Lacorte motion abnormality.   I performed peripheral angiography on him 05/29/2019 revealing high-grade disease in his right common extra iliac artery both of which were restented.  He had a VBX stent placed in his right common iliac artery and a nitinol self-expanding stent in his external iliac artery.  His right ABI increased from 0.7 up to 0.98.  His claudication has resolved.   Since I saw him in the office 6 months ago he has done well.  He does continue to smoke.  He denies chest pain, shortness of breath or claudication.  He apparently started Repatha since I saw him last.   Current Meds  Medication Sig   aspirin EC 81 MG tablet Take 1 tablet (81 mg total) by mouth daily.   atorvastatin (LIPITOR) 80 MG tablet Take  1 tablet (80 mg total) by mouth at bedtime.   carvedilol (COREG) 3.125 MG tablet Take 1 tablet (3.125 mg total) by mouth 2 (two) times daily.   Evolocumab (REPATHA SURECLICK) 756 MG/ML SOAJ Inject 1 mL into the skin every 14 (fourteen) days.   Vitamin D, Ergocalciferol, (DRISDOL) 1.25 MG (50000 UNIT) CAPS capsule Take 1 capsule (50,000 Units total) by mouth every 7 (seven) days.     Allergies  Allergen Reactions   Codeine  Other (See Comments)    Migraine     Social History   Socioeconomic History   Marital status: Divorced    Spouse name: Not on file   Number of children: Not on file   Years of education: Not on file   Highest education level: Not on file  Occupational History   Occupation: Truck Geophysicist/field seismologist  Tobacco Use   Smoking status: Some Days    Packs/day: 0.75    Years: 30.00    Total pack years: 22.50    Types: Cigarettes    Last attempt to quit: 06/07/2015    Years since quitting: 6.7   Smokeless tobacco: Former    Types: Snuff   Tobacco comments:    "used snuff in my teens"  Vaping Use   Vaping Use: Former  Substance and Sexual Activity   Alcohol use: Yes    Alcohol/week: 0.0 standard drinks of alcohol    Comment: 06/06/2015 "might have 1 beer/month"   Drug use: No   Sexual activity: Not Currently  Other Topics Concern   Not on file  Social History Narrative   Not on file   Social Determinants of Health   Financial Resource Strain: High Risk (12/12/2019)   Overall Financial Resource Strain (CARDIA)    Difficulty of Paying Living Expenses: Very hard  Food Insecurity: No Food Insecurity (12/12/2019)   Hunger Vital Sign    Worried About Running Out of Food in the Last Year: Never true    Ran Out of Food in the Last Year: Never true  Transportation Needs: No Transportation Needs (12/12/2019)   PRAPARE - Hydrologist (Medical): No    Lack of Transportation (Non-Medical): No  Physical Activity: Not on file  Stress: Not on file  Social Connections: Not on file  Intimate Partner Violence: Not on file     Review of Systems: General: negative for chills, fever, night sweats or weight changes.  Cardiovascular: negative for chest pain, dyspnea on exertion, edema, orthopnea, palpitations, paroxysmal nocturnal dyspnea or shortness of breath Dermatological: negative for rash Respiratory: negative for cough or wheezing Urologic: negative for  hematuria Abdominal: negative for nausea, vomiting, diarrhea, bright red blood per rectum, melena, or hematemesis Neurologic: negative for visual changes, syncope, or dizziness All other systems reviewed and are otherwise negative except as noted above.    Blood pressure 132/80, pulse (!) 58, height 5\' 9"  (1.753 m), weight 180 lb (81.6 kg), SpO2 95 %.  General appearance: alert and no distress Neck: no adenopathy, no JVD, supple, symmetrical, trachea midline, thyroid not enlarged, symmetric, no tenderness/mass/nodules, and bilateral carotid bruits Lungs: clear to auscultation bilaterally Heart: regular rate and rhythm, S1, S2 normal, no murmur, click, rub or gallop Extremities: extremities normal, atraumatic, no cyanosis or edema Pulses: 2+ and symmetric Skin: Skin color, texture, turgor normal. No rashes or lesions Neurologic: Grossly normal  EKG sinus bradycardia 58 septal Q waves.  I personally reviewed this EKG.  ASSESSMENT AND PLAN:   Tobacco use disorder  Ongoing tobacco abuse of 1 pack/day recalcitrant risk factor modification.  I am going to refer him to our life coach for smoking cessation counseling.  Claudication in peripheral vascular disease ([place]) History of PAD status post multiple lower extremity interventions in the past beginning back in 2015.  His most recent peripheral angiogram performed 05/29/2019 revealed high-grade disease in his right common and extrailiac artery both of which were restented with a VBX covered stents as well as nitinol self-expanding stents.  His most recent lower extremity arterial Doppler study performed 126/23 revealed these to be widely patent.  He denies claudication.  HLD (hyperlipidemia) History of hyperlipidemia on Repatha with lipid profile performed 09/10/2021 revealing total cholesterol 230, LDL 148 and HDL of 24.  I believe that Repatha was performed since his last lipid profile.  We will recheck a fasting lipid liver profile  today.  Essential hypertension History of essential hypertension blood pressure measured today at 132/80.  He is on carvedilol.  S/P CABG x 3 History of CAD status post left main stenting in [facility] with 3 catheterization performed by Dr. Burt Knack in the setting of unstable angina 04/09/2017 revealing 80% "in-stent restenosis.  He ultimately underwent CABG x 3 by Dr. Servando Snare 04/07/2017 with a [name] to his LAD, vein to [name] and 2 and has done well since.  He denies chest pain or shortness of breath.  Carotid artery disease ([place]) History of moderate bilateral ICA stenosis by duplex ultrasound/10/23.  Will repeat carotid Dopplers in April.     Lorretta Harp MD FACP,FACC,FAHA, [facility] 02/25/2022 11:58 AM

## 2022-02-25 NOTE — Assessment & Plan Note (Signed)
History of moderate bilateral ICA stenosis by duplex ultrasound/10/23.  Will repeat carotid Dopplers in April.

## 2022-02-25 NOTE — Telephone Encounter (Signed)
Called patient per health coaching referral for smoking cessation. Patient shared that his trigger for smoking is stress. Offered health coaching, patient stated that he wants to quit but enjoys smoking. Offered to send patient a workbook for smoking cessation and handouts regarding stress management for resources to guide his behavior change. Patient agreed to receiving resources via mail. Patient was provided with my contact card in case he does decide he wants to participate in health coaching for additional support and accountability.  Avelino Leeds, MS, ERHD, [facility]  Care Guide, Health & Wellness Coach [address] [place] [address] Telephone: [phone] Email: [email or web address]

## 2022-02-25 NOTE — Assessment & Plan Note (Signed)
History of hyperlipidemia on Repatha with lipid profile performed 09/10/2021 revealing total cholesterol 230, LDL 148 and HDL of 24.  I believe that Repatha was performed since his last lipid profile.  We will recheck a fasting lipid liver profile today.

## 2022-02-25 NOTE — Assessment & Plan Note (Signed)
History of CAD status post left main stenting in [facility] with 3 catheterization performed by Dr. Burt Knack in the setting of unstable angina 04/09/2017 revealing 80% "in-stent restenosis.  He ultimately underwent CABG x 3 by Dr. Servando Snare 04/07/2017 with a [name] to his LAD, vein to [name] and 2 and has done well since.  He denies chest pain or shortness of breath.

## 2022-02-25 NOTE — Assessment & Plan Note (Signed)
Ongoing tobacco abuse of 1 pack/day recalcitrant risk factor modification.  I am going to refer him to our life coach for smoking cessation counseling.

## 2022-02-25 NOTE — Patient Instructions (Signed)
Medication Instructions:  Your physician recommends that you continue on your current medications as directed. Please refer to the Current Medication list given to you today.  *If you need a refill on your cardiac medications before your next appointment, please call your pharmacy*   Lab Work: Your physician recommends that you have labs drawn today: Lipid/liver panel  If you have labs (blood work) drawn today and your tests are completely normal, you will receive your results only by: MyChart Message (if you have MyChart) OR A paper copy in the mail If you have any lab test that is abnormal or we need to change your treatment, we will call you to review the results.   Follow-Up: At [facility], you and your health needs are our priority.  As part of our continuing mission to provide you with exceptional heart care, we have created designated Provider Care Teams.  These Care Teams include your primary Cardiologist (physician) and Advanced Practice Providers (APPs -  Physician Assistants and Nurse Practitioners) who all work together to provide you with the care you need, when you need it.  We recommend signing up for the patient portal called "MyChart".  Sign up information is provided on this After Visit Summary.  MyChart is used to connect with patients for Virtual Visits (Telemedicine).  Patients are able to view lab/test results, encounter notes, upcoming appointments, etc.  Non-urgent messages can be sent to your provider as well.   To learn more about what you can do with MyChart, go to [email or web address].    Your next appointment:   6 month(s)  Provider:   Coletta Memos, FNP       Then, Quay Burow, MD will plan to see you again in 12 month(s).

## 2022-02-26 LAB — LIPID PANEL
Chol/HDL Ratio: 5.9 ratio — ABNORMAL HIGH (ref 0.0–5.0)
Cholesterol, Total: 170 mg/dL (ref 100–199)
HDL: 29 mg/dL — ABNORMAL LOW (ref >39)
LDL Chol Calc (NIH): 110 mg/dL — ABNORMAL HIGH (ref 0–99)
Triglycerides: 177 mg/dL — ABNORMAL HIGH (ref 0–149)
VLDL Cholesterol Cal: 31 mg/dL (ref 5–40)

## 2022-02-26 LAB — HEPATIC FUNCTION PANEL
ALT: 16 IU/L (ref 0–44)
AST: 56 IU/L — ABNORMAL HIGH (ref 0–40)
Albumin: 4.5 g/dL (ref 3.8–4.9)
Alkaline Phosphatase: 121 IU/L (ref 44–121)
Bilirubin Total: 0.3 mg/dL (ref 0.0–1.2)
Bilirubin, Direct: <0.1 mg/dL (ref 0.00–0.40)
Total Protein: 7.2 g/dL (ref 6.0–8.5)

## 2022-03-02 ENCOUNTER — Encounter: Payer: Self-pay | Admitting: Sports Medicine

## 2022-03-02 ENCOUNTER — Ambulatory Visit: Payer: Commercial Managed Care - HMO | Admitting: Sports Medicine

## 2022-03-02 ENCOUNTER — Ambulatory Visit: Payer: Self-pay

## 2022-03-02 ENCOUNTER — Ambulatory Visit (INDEPENDENT_AMBULATORY_CARE_PROVIDER_SITE_OTHER): Payer: Medicaid Other | Admitting: Sports Medicine

## 2022-03-02 DIAGNOSIS — M25551 Pain in right hip: Secondary | ICD-10-CM | POA: Diagnosis not present

## 2022-03-02 DIAGNOSIS — M1611 Unilateral primary osteoarthritis, right hip: Secondary | ICD-10-CM | POA: Diagnosis not present

## 2022-03-02 MED ORDER — BETAMETHASONE SOD PHOS & ACET 6 (3-3) MG/ML IJ SUSP
12.0000 mg | INTRAMUSCULAR | Status: AC | PRN
  Administered 2022-03-02: 12 mg via INTRA_ARTICULAR

## 2022-03-02 MED ORDER — LIDOCAINE HCL 1 % IJ SOLN
2.0000 mL | INTRAMUSCULAR | Status: AC | PRN
  Administered 2022-03-02: 2 mL

## 2022-03-02 MED ORDER — BUPIVACAINE HCL 0.25 % IJ SOLN
2.0000 mL | INTRAMUSCULAR | Status: AC | PRN
  Administered 2022-03-02: 2 mL via INTRA_ARTICULAR
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
## 2022-03-03 ENCOUNTER — Encounter: Payer: Commercial Managed Care - HMO | Admitting: Student

## 2022-03-11 ENCOUNTER — Other Ambulatory Visit (HOSPITAL_COMMUNITY): Payer: Self-pay

## 2022-03-11 ENCOUNTER — Other Ambulatory Visit: Payer: Self-pay

## 2022-03-11 ENCOUNTER — Encounter: Payer: Self-pay | Admitting: Student

## 2022-03-11 ENCOUNTER — Ambulatory Visit (HOSPITAL_COMMUNITY)
Admission: RE | Admit: 2022-03-11 | Discharge: 2022-03-11 | Disposition: A | Discharge status: Home / Self Care | Payer: Commercial Managed Care - HMO | Source: Ambulatory Visit | Attending: Internal Medicine | Admitting: Internal Medicine

## 2022-03-11 ENCOUNTER — Ambulatory Visit (INDEPENDENT_AMBULATORY_CARE_PROVIDER_SITE_OTHER): Payer: Commercial Managed Care - HMO | Admitting: Student

## 2022-03-11 VITALS — BP 129/76 | HR 59 | Temp 97.5°F | Ht 71.0 in | Wt 181.8 lb

## 2022-03-11 DIAGNOSIS — M25562 Pain in left knee: Secondary | ICD-10-CM

## 2022-03-11 DIAGNOSIS — M1611 Unilateral primary osteoarthritis, right hip: Secondary | ICD-10-CM

## 2022-03-11 DIAGNOSIS — M25561 Pain in right knee: Secondary | ICD-10-CM

## 2022-03-11 DIAGNOSIS — G8929 Other chronic pain: Secondary | ICD-10-CM | POA: Diagnosis not present

## 2022-03-11 DIAGNOSIS — M76892 Other specified enthesopathies of left lower limb, excluding foot: Secondary | ICD-10-CM | POA: Diagnosis not present

## 2022-03-11 MED ORDER — METHOCARBAMOL 750 MG PO TABS
750.0000 mg | ORAL_TABLET | Freq: Three times a day (TID) | ORAL | 1 refills | Status: AC | PRN
  Filled 2022-03-11: qty 20, 7d supply, fill #0

## 2022-03-11 NOTE — Patient Instructions (Addendum)
[name] [name],  It was nice seeing you in the clinic today.  Please follow-up with Dr. Rolena Infante for your right hip osteoarthritis.  Please continue Tylenol as needed for pain.  You can take 1000 mg 3 times a day if needed to.  Please only take Aleve when pain is really bad.    2.  I prescribed a short course of muscle relaxant for your lower back tightness.  This medication can be sedating so please do not drive or operate heavy machinery after taking this medication.  The best way to take it is before you go to sleep.  3.  I will obtain an x-ray of your knees to evaluate the severity of your arthritis.  Please obtain knee braces which can help stabilize your knee and reduce pain.  Please return in 3 months, sooner if needed  Dr. Alfonse Spruce

## 2022-03-11 NOTE — Assessment & Plan Note (Addendum)
Patient was seen by Dr. Rolena Infante with orthopedic and has had 2 steroid injection, in September and in February.  He said that the injections usually last about 1 week.  Currently pain 4/10, was 9/10 before the injection.  He is taking Tylenol 1 g daily as needed, about 3-4 times a week.  He occasionally takes Aleve for breakthrough pain.  He has tried Voltaren gel but did not receive any relief.  He has worked with physical therapy for about 2 months without much benefits.  His PT told him that the benefits from therapy has plateaued.  -Advised patient to follow-up with Dr. Rolena Infante on 2/28.  They are considering an MRI if his pain does not improve.  Patient is amenable for surgery if offered -Continue Tylenol as needed for pain.  Advised patient to take it more frequently if needed -Avoid NSAIDs due to his CAD and PAD -Short course of Robaxin for his paraspinal muscle hypertonicity.  Patient counseled on side effects
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
## 2022-03-11 NOTE — Assessment & Plan Note (Addendum)
This is a chronic issue.  Patient endorses pain of bilateral knees, right worse than left.  Pain localized to the medial and lateral joint space.  Described pain as aching.  He also reports crepitus and locking sensation.  Patient states that he played many sports running when he was younger that led to faster wear and tear.[name]  He denies any joint swelling or erythema.  Pain is worse with exertion and better with rest.  He is taking Tylenol for some relief.  Said the pain is limiting his walking and function.  Physical exam showed normal passive range of motion of bilateral knees.  He has pain with varus and valgus maneuver more in the right knee.  There is no palpable effusion.  This is likely osteoarthritis.  No red flags symptoms or signs of autoimmune disease.  -Obtain x-ray of bilateral knees to evaluate the severity of his osteoarthritis. -Continue Tylenol as needed for pain. -I offered Voltaren gel that he declined -I will notify his orthopedic about his knee issue as well

## 2022-03-12 NOTE — Progress Notes (Signed)
Internal Medicine Clinic Attending  Case discussed with Dr. Nguyen  At the time of the visit.  We reviewed the resident's history and exam and pertinent patient test results.  I agree with the assessment, diagnosis, and plan of care documented in the resident's note. 

## 2022-03-13 ENCOUNTER — Other Ambulatory Visit (HOSPITAL_COMMUNITY): Payer: Self-pay

## 2022-03-13 ENCOUNTER — Other Ambulatory Visit: Payer: Self-pay | Admitting: Student

## 2022-03-13 ENCOUNTER — Encounter: Payer: Self-pay | Admitting: Pharmacist

## 2022-03-13 ENCOUNTER — Other Ambulatory Visit: Payer: Self-pay

## 2022-03-13 ENCOUNTER — Other Ambulatory Visit: Payer: Self-pay | Admitting: Cardiovascular Disease

## 2022-03-13 ENCOUNTER — Ambulatory Visit: Payer: Commercial Managed Care - HMO | Attending: Internal Medicine | Admitting: Pharmacist

## 2022-03-13 DIAGNOSIS — I6523 Occlusion and stenosis of bilateral carotid arteries: Secondary | ICD-10-CM | POA: Diagnosis not present

## 2022-03-13 DIAGNOSIS — E782 Mixed hyperlipidemia: Secondary | ICD-10-CM | POA: Diagnosis not present

## 2022-03-13 DIAGNOSIS — I739 Peripheral vascular disease, unspecified: Secondary | ICD-10-CM | POA: Diagnosis not present

## 2022-03-13 DIAGNOSIS — Z91148 Patient's other noncompliance with medication regimen for other reason: Secondary | ICD-10-CM | POA: Diagnosis not present

## 2022-03-13 DIAGNOSIS — I25810 Atherosclerosis of coronary artery bypass graft(s) without angina pectoris: Secondary | ICD-10-CM

## 2022-03-13 DIAGNOSIS — E559 Vitamin D deficiency, unspecified: Secondary | ICD-10-CM

## 2022-03-13 NOTE — Patient Instructions (Addendum)
It was good seeing you again  I will switch over your medications to delivery services on Monday  You can use the app to request delivery services  Lets recheck your levels again in a few months  Please send [place] any messages with any questions  Karren Cobble, PharmD, [place], [place], [place], [place], [place], [address] Phone: [phone], Fax: [phone]
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
## 2022-03-16 ENCOUNTER — Encounter: Payer: Self-pay | Admitting: Student

## 2022-03-16 ENCOUNTER — Encounter: Payer: Self-pay | Admitting: Pharmacist

## 2022-03-17 ENCOUNTER — Other Ambulatory Visit: Payer: Self-pay

## 2022-03-18 ENCOUNTER — Ambulatory Visit: Payer: Commercial Managed Care - HMO | Admitting: Sports Medicine

## 2022-03-18 ENCOUNTER — Other Ambulatory Visit (HOSPITAL_COMMUNITY): Payer: Self-pay

## 2022-03-20 ENCOUNTER — Other Ambulatory Visit (HOSPITAL_COMMUNITY): Payer: Self-pay

## 2022-03-23 ENCOUNTER — Other Ambulatory Visit (HOSPITAL_COMMUNITY): Payer: Self-pay

## 2022-03-24 ENCOUNTER — Other Ambulatory Visit (HOSPITAL_COMMUNITY): Payer: Self-pay

## 2022-04-06 ENCOUNTER — Emergency Department: Payer: Medicaid Other

## 2022-04-06 ENCOUNTER — Other Ambulatory Visit (HOSPITAL_COMMUNITY): Payer: Self-pay

## 2022-04-06 ENCOUNTER — Other Ambulatory Visit: Payer: Self-pay

## 2022-04-06 ENCOUNTER — Emergency Department
Admission: EM | Admit: 2022-04-06 | Discharge: 2022-04-06 | Disposition: A | Discharge status: Home / Self Care | Payer: Medicaid Other | Attending: Emergency Medicine | Admitting: Emergency Medicine

## 2022-04-06 DIAGNOSIS — I2510 Atherosclerotic heart disease of native coronary artery without angina pectoris: Secondary | ICD-10-CM | POA: Insufficient documentation

## 2022-04-06 DIAGNOSIS — I25810 Atherosclerosis of coronary artery bypass graft(s) without angina pectoris: Secondary | ICD-10-CM

## 2022-04-06 DIAGNOSIS — Z951 Presence of aortocoronary bypass graft: Secondary | ICD-10-CM

## 2022-04-06 DIAGNOSIS — R0789 Other chest pain: Secondary | ICD-10-CM | POA: Diagnosis not present

## 2022-04-06 LAB — CBC
HCT: 45.4 % (ref 39.0–52.0)
Hemoglobin: 15.1 g/dL (ref 13.0–17.0)
MCH: 29.3 pg (ref 26.0–34.0)
MCHC: 33.3 g/dL (ref 30.0–36.0)
MCV: 88.2 fL (ref 80.0–100.0)
Platelets: 243 10*3/uL (ref 150–400)
RBC: 5.15 MIL/uL (ref 4.22–5.81)
RDW: 13.9 % (ref 11.5–15.5)
WBC: 11.1 10*3/uL — ABNORMAL HIGH (ref 4.0–10.5)
nRBC: 0 % (ref 0.0–0.2)

## 2022-04-06 LAB — BASIC METABOLIC PANEL
Anion gap: 7 (ref 5–15)
BUN: 18 mg/dL (ref 6–20)
CO2: 24 mmol/L (ref 22–32)
Calcium: 8.7 mg/dL — ABNORMAL LOW (ref 8.9–10.3)
Chloride: 107 mmol/L (ref 98–111)
Creatinine, Ser: 1.16 mg/dL (ref 0.61–1.24)
GFR, Estimated: >60 mL/min (ref >60)
Glucose, Bld: 102 mg/dL — ABNORMAL HIGH (ref 70–99)
Potassium: 4.1 mmol/L (ref 3.5–5.1)
Sodium: 138 mmol/L (ref 135–145)

## 2022-04-06 LAB — TROPONIN I (HIGH SENSITIVITY): Troponin I (High Sensitivity): 21 ng/L — ABNORMAL HIGH (ref <18)

## 2022-04-06 MED ORDER — TRAMADOL HCL 50 MG PO TABS
50.0000 mg | ORAL_TABLET | Freq: Four times a day (QID) | ORAL | 0 refills | Status: DC | PRN
  Filled 2022-04-06: qty 20, 5d supply, fill #0

## 2022-04-06 NOTE — ED Provider Notes (Signed)
[facility] Provider Note    Event Date/Time   First MD Initiated Contact with Patient 04/06/22 1229     (approximate)   History   Chest Pain   HPI  [name] [name] is a 52 y.o. male with a history of CAD status post CABG who presents with complaints of left anterior chest pain which he reports started this morning, now essentially resolved.  Does not routinely follow-up with cardiology since his CABG     Physical Exam   Triage Vital Signs: ED Triage Vitals  Enc Vitals Group     BP 04/06/22 1115 (!) 149/88     Pulse Rate 04/06/22 1115 (!) 58     Resp 04/06/22 1115 16     Temp 04/06/22 1115 97.8 F (36.6 C)     Temp src --      SpO2 04/06/22 1115 98 %     Weight 04/06/22 1116 82.6 kg (182 lb)     Height 04/06/22 1116 1.753 m (5\' 9" )     Head Circumference --      Peak Flow --      Pain Score 04/06/22 1116 2     Pain Loc --      Pain Edu? --      Excl. in [place]? --     Most recent vital signs: Vitals:   04/06/22 1115 04/06/22 1321  BP: (!) 149/88 (!) 140/89  Pulse: (!) 58 (!) 52  Resp: 16 15  Temp: 97.8 F (36.6 C)   SpO2: 98% 98%     General: Awake, no distress.  CV:  Good peripheral perfusion.  Patient tender overlying the left breast, no rash, this seems to replicate his symptoms with radiation to the left arm. Resp:  Normal effort.  Clear to auscultation bilaterally Abd:  No distention.  Other:  No calf pain or edema   ED Results / Procedures / Treatments   Labs (all labs ordered are listed, but only abnormal results are displayed) Labs Reviewed  BASIC METABOLIC PANEL - Abnormal; Notable for the following components:      Result Value   Glucose, Bld 102 (*)    Calcium 8.7 (*)    All other components within normal limits  CBC - Abnormal; Notable for the following components:   WBC 11.1 (*)    All other components within normal limits  TROPONIN I (HIGH SENSITIVITY) - Abnormal; Notable for the following components:    Troponin I (High Sensitivity) 21 (*)    All other components within normal limits  TROPONIN I (HIGH SENSITIVITY)     EKG  ED ECG REPORT I, Lavonia Drafts, the attending physician, personally viewed and interpreted this ECG.  Date: 04/06/2022  Rhythm: normal sinus rhythm QRS Axis: normal Intervals: normal ST/T Wave abnormalities: normal Narrative Interpretation: no evidence of acute ischemia    RADIOLOGY Chest x-ray viewed interpret by me, no acute abnormality    PROCEDURES:  Critical Care performed:   Procedures   MEDICATIONS ORDERED IN ED: Medications - No data to display   IMPRESSION / MDM / [place] / ED COURSE  I reviewed the triage vital signs and the nursing notes. Patient's presentation is most consistent with acute presentation with potential threat to life or bodily function.   Patient presents with chest pain as detailed above, history of CAD, CABG.  Differential includes ACS, angina, less likely pneumonia, musculoskeletal pain.  Seems most consistent with musculoskeletal pain, EKG is reassuring, high  sensitive troponin is normal, chest x-ray without acute abnormality.  Discussed admission however patient reports he is feeling well and feels comfortable with outpatient follow-up.  He knows to return to the emergency department if any change in or worsening of his symptoms.       FINAL CLINICAL IMPRESSION(S) / ED DIAGNOSES   Final diagnoses:  Atypical chest pain  S/P CABG x 3  Coronary artery disease involving coronary bypass graft of native heart without angina pectoris     Rx / DC Orders   ED Discharge Orders          Ordered    Ambulatory referral to Cardiology       Comments: If you have not heard from the Cardiology office within the next 72 hours please call [phone].   04/06/22 1252    traMADol (ULTRAM) 50 MG tablet  Every 6 hours PRN        04/06/22 1321             Note:  This document was prepared using  Dragon voice recognition software and may include unintentional dictation errors.   Lavonia Drafts, MD 04/06/22 [phone]

## 2022-04-06 NOTE — ED Triage Notes (Signed)
Pt to ED for generalized cp that started today. Denies n/v, shob

## 2022-04-10 ENCOUNTER — Other Ambulatory Visit (HOSPITAL_COMMUNITY): Payer: Self-pay

## 2022-05-06 ENCOUNTER — Ambulatory Visit (HOSPITAL_COMMUNITY)
Admission: RE | Admit: 2022-05-06 | Discharge: 2022-05-06 | Disposition: A | Discharge status: Home / Self Care | Payer: Medicaid Other | Source: Ambulatory Visit | Attending: Cardiovascular Disease | Admitting: Cardiovascular Disease

## 2022-05-06 DIAGNOSIS — I480 Paroxysmal atrial fibrillation: Secondary | ICD-10-CM | POA: Insufficient documentation

## 2022-05-06 DIAGNOSIS — I6523 Occlusion and stenosis of bilateral carotid arteries: Secondary | ICD-10-CM | POA: Insufficient documentation

## 2022-05-06 DIAGNOSIS — I739 Peripheral vascular disease, unspecified: Secondary | ICD-10-CM | POA: Diagnosis not present

## 2022-05-06 DIAGNOSIS — F17219 Nicotine dependence, cigarettes, with unspecified nicotine-induced disorders: Secondary | ICD-10-CM | POA: Diagnosis not present

## 2022-05-06 DIAGNOSIS — R55 Syncope and collapse: Secondary | ICD-10-CM | POA: Insufficient documentation

## 2022-05-06 DIAGNOSIS — I10 Essential (primary) hypertension: Secondary | ICD-10-CM

## 2022-05-06 DIAGNOSIS — Z951 Presence of aortocoronary bypass graft: Secondary | ICD-10-CM | POA: Insufficient documentation

## 2022-05-06 DIAGNOSIS — Z95820 Peripheral vascular angioplasty status with implants and grafts: Secondary | ICD-10-CM | POA: Diagnosis not present

## 2022-05-06 DIAGNOSIS — E782 Mixed hyperlipidemia: Secondary | ICD-10-CM

## 2022-05-07 LAB — VAS US ABI WITH/WO TBI
Left ABI: 1.06
Right ABI: 0.86

## 2022-06-15 ENCOUNTER — Encounter: Payer: Self-pay | Admitting: *Deleted

## 2022-06-20 ENCOUNTER — Observation Stay
Admission: EM | Admit: 2022-06-20 | Discharge: 2022-06-21 | Disposition: A | Discharge status: Home / Self Care | Payer: Medicaid Other | Attending: Obstetrics and Gynecology | Admitting: Obstetrics and Gynecology

## 2022-06-20 ENCOUNTER — Inpatient Hospital Stay: Payer: Medicaid Other

## 2022-06-20 ENCOUNTER — Inpatient Hospital Stay (HOSPITAL_COMMUNITY)
Admit: 2022-06-20 | Discharge: 2022-06-20 | Disposition: A | Discharge status: Home / Self Care | Payer: Medicaid Other | Attending: Internal Medicine | Admitting: Internal Medicine

## 2022-06-20 ENCOUNTER — Other Ambulatory Visit: Payer: Self-pay

## 2022-06-20 DIAGNOSIS — R4182 Altered mental status, unspecified: Secondary | ICD-10-CM | POA: Diagnosis not present

## 2022-06-20 DIAGNOSIS — F17200 Nicotine dependence, unspecified, uncomplicated: Secondary | ICD-10-CM | POA: Diagnosis not present

## 2022-06-20 DIAGNOSIS — R7989 Other specified abnormal findings of blood chemistry: Secondary | ICD-10-CM | POA: Insufficient documentation

## 2022-06-20 DIAGNOSIS — I779 Disorder of arteries and arterioles, unspecified: Secondary | ICD-10-CM | POA: Diagnosis present

## 2022-06-20 DIAGNOSIS — F17210 Nicotine dependence, cigarettes, uncomplicated: Secondary | ICD-10-CM | POA: Insufficient documentation

## 2022-06-20 DIAGNOSIS — I739 Peripheral vascular disease, unspecified: Secondary | ICD-10-CM | POA: Diagnosis present

## 2022-06-20 DIAGNOSIS — I10 Essential (primary) hypertension: Secondary | ICD-10-CM | POA: Diagnosis present

## 2022-06-20 DIAGNOSIS — Z951 Presence of aortocoronary bypass graft: Secondary | ICD-10-CM

## 2022-06-20 DIAGNOSIS — R42 Dizziness and giddiness: Secondary | ICD-10-CM

## 2022-06-20 DIAGNOSIS — I2510 Atherosclerotic heart disease of native coronary artery without angina pectoris: Secondary | ICD-10-CM | POA: Diagnosis not present

## 2022-06-20 DIAGNOSIS — R55 Syncope and collapse: Secondary | ICD-10-CM | POA: Diagnosis not present

## 2022-06-20 DIAGNOSIS — I480 Paroxysmal atrial fibrillation: Secondary | ICD-10-CM | POA: Diagnosis present

## 2022-06-20 DIAGNOSIS — Z720 Tobacco use: Secondary | ICD-10-CM

## 2022-06-20 DIAGNOSIS — E785 Hyperlipidemia, unspecified: Secondary | ICD-10-CM | POA: Diagnosis present

## 2022-06-20 DIAGNOSIS — R008 Other abnormalities of heart beat: Secondary | ICD-10-CM | POA: Diagnosis not present

## 2022-06-20 DIAGNOSIS — N529 Male erectile dysfunction, unspecified: Secondary | ICD-10-CM | POA: Diagnosis not present

## 2022-06-20 HISTORY — DX: Other specified abnormal findings of blood chemistry: R79.89

## 2022-06-20 LAB — BASIC METABOLIC PANEL
Anion gap: 7 (ref 5–15)
BUN: 14 mg/dL (ref 6–20)
CO2: 24 mmol/L (ref 22–32)
Calcium: 8.7 mg/dL — ABNORMAL LOW (ref 8.9–10.3)
Chloride: 107 mmol/L (ref 98–111)
Creatinine, Ser: 1.3 mg/dL — ABNORMAL HIGH (ref 0.61–1.24)
GFR, Estimated: >60 mL/min (ref >60)
Glucose, Bld: 143 mg/dL — ABNORMAL HIGH (ref 70–99)
Potassium: 4.1 mmol/L (ref 3.5–5.1)
Sodium: 138 mmol/L (ref 135–145)

## 2022-06-20 LAB — ECHOCARDIOGRAM COMPLETE

## 2022-06-20 LAB — CBC
HCT: 44.8 % (ref 39.0–52.0)
Hemoglobin: 14.8 g/dL (ref 13.0–17.0)
MCH: 29 pg (ref 26.0–34.0)
MCHC: 33 g/dL (ref 30.0–36.0)
MCV: 87.7 fL (ref 80.0–100.0)
Platelets: 211 10*3/uL (ref 150–400)
RBC: 5.11 MIL/uL (ref 4.22–5.81)
RDW: 13.7 % (ref 11.5–15.5)
WBC: 10.9 10*3/uL — ABNORMAL HIGH (ref 4.0–10.5)
nRBC: 0 % (ref 0.0–0.2)

## 2022-06-20 LAB — CBG MONITORING, ED: Glucose-Capillary: 157 mg/dL — ABNORMAL HIGH (ref 70–99)

## 2022-06-20 LAB — URINALYSIS, ROUTINE W REFLEX MICROSCOPIC
Bilirubin Urine: NEGATIVE
Glucose, UA: NEGATIVE mg/dL
Hgb urine dipstick: NEGATIVE
Ketones, ur: NEGATIVE mg/dL
Leukocytes,Ua: NEGATIVE
Nitrite: NEGATIVE
Protein, ur: NEGATIVE mg/dL
Specific Gravity, Urine: 1.001 — ABNORMAL LOW (ref 1.005–1.030)
pH: 6 (ref 5.0–8.0)

## 2022-06-20 LAB — TROPONIN I (HIGH SENSITIVITY)
Troponin I (High Sensitivity): 24 ng/L — ABNORMAL HIGH (ref <18)
Troponin I (High Sensitivity): 23 ng/L — ABNORMAL HIGH (ref <18)

## 2022-06-20 LAB — MAGNESIUM: Magnesium: 2.3 mg/dL (ref 1.7–2.4)

## 2022-06-20 MED ORDER — NICOTINE 21 MG/24HR TD PT24: 21.0000 mg | MEDICATED_PATCH | Freq: Every day | TRANSDERMAL | Status: DC | PRN

## 2022-06-20 MED ORDER — HEPARIN SODIUM (PORCINE) 5000 UNIT/ML IJ SOLN
5000.0000 [IU] | Freq: Three times a day (TID) | INTRAMUSCULAR | Status: DC
  Administered 2022-06-20 – 2022-06-21 (×2): 5000 [IU] via SUBCUTANEOUS
  Filled 2022-06-20 (×2): qty 1

## 2022-06-20 MED ORDER — SODIUM CHLORIDE 0.9% FLUSH
3.0000 mL | Freq: Two times a day (BID) | INTRAVENOUS | Status: DC
  Administered 2022-06-20 (×2): 3 mL via INTRAVENOUS

## 2022-06-20 MED ORDER — NICOTINE POLACRILEX 2 MG MT GUM: 2.0000 mg | CHEWING_GUM | OROMUCOSAL | Status: DC | PRN

## 2022-06-20 MED ORDER — MELATONIN 5 MG PO TABS: 5.0000 mg | ORAL_TABLET | Freq: Every evening | ORAL | Status: DC | PRN

## 2022-06-20 MED ORDER — TRAMADOL HCL 50 MG PO TABS: 50.0000 mg | ORAL_TABLET | Freq: Four times a day (QID) | ORAL | Status: DC | PRN

## 2022-06-20 MED ORDER — ACETAMINOPHEN 650 MG RE SUPP: 650.0000 mg | Freq: Four times a day (QID) | RECTAL | Status: DC | PRN

## 2022-06-20 MED ORDER — ASPIRIN 81 MG PO TBEC
81.0000 mg | DELAYED_RELEASE_TABLET | Freq: Every day | ORAL | Status: DC
  Administered 2022-06-20: 81 mg via ORAL
  Filled 2022-06-20: qty 1

## 2022-06-20 MED ORDER — SENNOSIDES-DOCUSATE SODIUM 8.6-50 MG PO TABS: 1.0000 | ORAL_TABLET | Freq: Every evening | ORAL | Status: DC | PRN

## 2022-06-20 MED ORDER — CARVEDILOL 3.125 MG PO TABS
3.1250 mg | ORAL_TABLET | Freq: Two times a day (BID) | ORAL | Status: DC
  Administered 2022-06-20: 3.125 mg via ORAL
  Filled 2022-06-20: qty 1

## 2022-06-20 MED ORDER — ONDANSETRON HCL 4 MG/2ML IJ SOLN: 4.0000 mg | Freq: Four times a day (QID) | INTRAMUSCULAR | Status: DC | PRN

## 2022-06-20 MED ORDER — ATORVASTATIN CALCIUM 80 MG PO TABS
80.0000 mg | ORAL_TABLET | Freq: Every day | ORAL | Status: DC
  Administered 2022-06-20: 80 mg via ORAL
  Filled 2022-06-20: qty 1
  Filled 2022-06-20: qty 4

## 2022-06-20 MED ORDER — ONDANSETRON HCL 4 MG PO TABS: 4.0000 mg | ORAL_TABLET | Freq: Four times a day (QID) | ORAL | Status: DC | PRN

## 2022-06-20 MED ORDER — ACETAMINOPHEN 325 MG PO TABS: 650.0000 mg | ORAL_TABLET | Freq: Four times a day (QID) | ORAL | Status: DC | PRN

## 2022-06-20 MED ORDER — HYDRALAZINE HCL 20 MG/ML IJ SOLN: 5.0000 mg | Freq: Four times a day (QID) | INTRAMUSCULAR | Status: DC | PRN

## 2022-06-20 MED ORDER — LORAZEPAM 2 MG/ML IJ SOLN: 1.0000 mg | INTRAMUSCULAR | Status: DC | PRN
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
## 2022-06-20 NOTE — ED Triage Notes (Signed)
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 Pt reports lightheaded feeling at this time and states "it feels like my carotid is being closed off."

## 2022-06-20 NOTE — ED Notes (Signed)
Patient transported to X-ray 

## 2022-06-20 NOTE — Hospital Course (Signed)
[name] is a 52 year male with history of cad s/p PCI and CABG, hyperlipidemia, hypertension, tobacco use, nicotine dependence, osteoarthritis, who presents emergency department from work for chief concerns of 3 episodes of syncope.  Vitals in the ED showed temperature of 97.6, respiration rate of 15, heart rate of 50, blood pressure 199/92, SpO2 of 98% on room air.  Serum sodium is 138, potassium 4.1, chloride 107, bicarb 24, BUN of 14, serum creatinine 1.30, EGFR greater than 60, nonfasting blood glucose 143, WBC 10.9, hemoglobin 14.8, platelets of 211.  High sensitive troponin was 24.  Magnesium was 2.3.  UA was negative for leukocytes and nitrates.  ED treatment: None

## 2022-06-20 NOTE — Assessment & Plan Note (Addendum)
Coreg 3.125 mg p.o. twice daily resumed Hydralazine 5 mg IV every 6 hours as needed for SBP greater than 175, 5 days ordered

## 2022-06-20 NOTE — Assessment & Plan Note (Signed)
Not in [facility]

## 2022-06-20 NOTE — Assessment & Plan Note (Signed)
Downtrending. 

## 2022-06-20 NOTE — ED Provider Notes (Signed)
[facility] Provider Note    Event Date/Time   First MD Initiated Contact with Patient 06/20/22 1132     (approximate)   History   Loss of Consciousness   HPI  [name] [name] is a 52 y.o. male  here with LOC. Patient reports that he has had 4 episodes of near syncope today. First started while he was at work. 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 He says he began to feel very weak, sweaty, and lightheaded like he was going to pass out. He then sat down and had slight improvement in his sx. Since then, this has recurred 3 times including at rest sitting down.       Physical Exam   Triage Vital Signs: ED Triage Vitals  Enc Vitals Group     BP 06/20/22 1107 (!) 197/80     Pulse Rate 06/20/22 1107 64     Resp 06/20/22 1107 20     Temp 06/20/22 1107 97.6 F (36.4 C)     Temp Source 06/20/22 1107 Oral     SpO2 06/20/22 1107 97 %     Weight 06/20/22 1108 180 lb (81.6 kg)     Height 06/20/22 1108 5\' 9"  (1.753 m)     Head Circumference --      Peak Flow --      Pain Score 06/20/22 1107 2     Pain Loc --      Pain Edu? --      Excl. in GC? --     Most recent vital signs: Vitals:   06/20/22 1145 06/20/22 1300  BP: (!) 199/92 (!) 156/83  Pulse: (!) 39 (!) 59  Resp: 15 20  Temp:    SpO2: 98% 99%     General: Awake, no distress.  CV:  Good peripheral perfusion. Regular rate and rhythm. Resp:  Normal work of breathing. Lungs clear. Abd:  No distention. No tenderness. Other:  No edema   ED Results / Procedures / Treatments   Labs (all labs ordered are listed, but only abnormal results are displayed) Labs Reviewed  BASIC METABOLIC PANEL - Abnormal; Notable for the following components:      Result Value   Glucose, Bld 143 (*)    Creatinine, Ser 1.30 (*)    Calcium 8.7 (*)    All other components within normal limits  CBC - Abnormal; Notable for the following components:   WBC 10.9 (*)    All other  components within normal limits  URINALYSIS, ROUTINE W REFLEX MICROSCOPIC - Abnormal; Notable for the following components:   Color, Urine COLORLESS (*)    APPearance CLEAR (*)    Specific Gravity, Urine 1.001 (*)    All other components within normal limits  CBG MONITORING, ED - Abnormal; Notable for the following components:   Glucose-Capillary 157 (*)    All other components within normal limits  TROPONIN I (HIGH SENSITIVITY) - Abnormal; Notable for the following components:   Troponin I (High Sensitivity) 24 (*)    All other components within normal limits  MAGNESIUM  TROPONIN I (HIGH SENSITIVITY)     EKG Trigeminy with VR 67. PR 120, QRS 102, QTc 407. No acute ST elevations or depressions. No ischemia or infarct.   RADIOLOGY None   I also independently reviewed and agree with radiologist interpretations.   PROCEDURES:  Critical Care performed: No  .1-3 Lead EKG Interpretation  Performed by: Erma Heritage,  Sheria Lang, MD Authorized by: Shaune Pollack, MD     Interpretation: abnormal     ECG rate:  60-80   ECG rate assessment: normal     Rhythm: sinus rhythm     Ectopy: PVCs     Conduction: normal   Comments:     Indication: Syncope     MEDICATIONS ORDERED IN ED: Medications - No data to display   IMPRESSION / MDM / ASSESSMENT AND PLAN / ED COURSE  I reviewed the triage vital signs and the nursing notes.                              Differential diagnosis includes, but is not limited to, arrhythmia, orthostasis, anemia, ACS, PE, medication effect  Patient's presentation is most consistent with acute presentation with potential threat to life or bodily function.  The patient is on the cardiac monitor to evaluate for evidence of arrhythmia and/or significant heart rate changes  52 yo M with h/o CAD here with recurrent syncope x 4 episodes today. Pt is well appearing currently. Telemetry reveals frequent ectopy with intermittent bi- and tri-geminy, and pt remains  symptomatic here. Orthostatic vs are wnl. Trop at baseline. Lytes unremarkable. CBC with minimal non specific leukocytosis - denies fevers or infectious sx. UA negative.  Given his ongoing ectopy with significant cardiac history and 4 episodes today including in waiting room at rest, will admit for obs.    FINAL CLINICAL IMPRESSION(S) / ED DIAGNOSES   Final diagnoses:  Near syncope  Trigeminy     Rx / DC Orders   ED Discharge Orders     None        Note:  This document was prepared using Dragon voice recognition software and may include unintentional dictation errors.   Shaune Pollack, MD 06/20/22 1339

## 2022-06-20 NOTE — Assessment & Plan Note (Addendum)
Nicotine patch or nicotine gum as needed for nicotine craving ordered More than 3 minutes spent on counseling patient on tobacco cessation Patient endorses readiness to stop tobacco use.  Tobacco cessation counseling:  Week one, smoke 19 cigarettes per day. Week two, smoke 18 cigarettes per day. Week three, smoke 17 cigarettes per day, continue until smoking half the amount of cigarettes per day Only smoke outside and wear outer covering Leave cigarettes and lighters outside in separate places During in-between cigarettes, if you feel the urge to smoke, use the following: stress squeezing devices/phone a trusted friend to talk you through the urge/walk in a safe environment Avoid prolong interactions with individuals actively smoking cigarettes If you smoke in social setting, avoid social setting where cigarette smoking is expected or considered acceptable  If missing the feeling of holding a cigarette, cut a sipping straw to the length of a cigarette and hold it between your fingers Call 1800 QUIT NOW if in need of nicotine patches to help with cessation

## 2022-06-20 NOTE — Assessment & Plan Note (Signed)
-   Atorvastatin 80 mg nightly resumed ?

## 2022-06-20 NOTE — Assessment & Plan Note (Addendum)
Coreg 3.125 mg p.o. twice daily, atorvastatin 80 mg nightly, aspirin 81 mg daily resumed resumed

## 2022-06-20 NOTE — Assessment & Plan Note (Addendum)
Multiple episodes of near syncope with blackout Etiology workup in progress Patient has bilateral carotid artery stenosis that is mild to moderate and history of CAD status post PCI and CABG Chest x-ray two-view ordered, MRI of the brain without contrast ordered Complete echo ordered, a.m. team to consult cardiology pending complete echo Admit to telemetry cardiac, inpatient

## 2022-06-21 DIAGNOSIS — R42 Dizziness and giddiness: Secondary | ICD-10-CM | POA: Diagnosis not present

## 2022-06-21 LAB — URINE DRUG SCREEN, QUALITATIVE (ARMC ONLY)
Amphetamines, Ur Screen: NOT DETECTED
Barbiturates, Ur Screen: NOT DETECTED
Benzodiazepine, Ur Scrn: NOT DETECTED
Cannabinoid 50 Ng, Ur ~~LOC~~: NOT DETECTED
Cocaine Metabolite,Ur ~~LOC~~: NOT DETECTED
MDMA (Ecstasy)Ur Screen: NOT DETECTED
Methadone Scn, Ur: NOT DETECTED
Opiate, Ur Screen: NOT DETECTED
Phencyclidine (PCP) Ur S: NOT DETECTED
Tricyclic, Ur Screen: NOT DETECTED

## 2022-06-21 LAB — BASIC METABOLIC PANEL
Anion gap: 7 (ref 5–15)
BUN: 17 mg/dL (ref 6–20)
CO2: 24 mmol/L (ref 22–32)
Calcium: 8.5 mg/dL — ABNORMAL LOW (ref 8.9–10.3)
Chloride: 110 mmol/L (ref 98–111)
Creatinine, Ser: 1.21 mg/dL (ref 0.61–1.24)
GFR, Estimated: >60 mL/min (ref >60)
Glucose, Bld: 109 mg/dL — ABNORMAL HIGH (ref 70–99)
Potassium: 4.2 mmol/L (ref 3.5–5.1)
Sodium: 141 mmol/L (ref 135–145)

## 2022-06-21 LAB — CBC
HCT: 45.3 % (ref 39.0–52.0)
Hemoglobin: 15 g/dL (ref 13.0–17.0)
MCH: 29.1 pg (ref 26.0–34.0)
MCHC: 33.1 g/dL (ref 30.0–36.0)
MCV: 88 fL (ref 80.0–100.0)
Platelets: 205 10*3/uL (ref 150–400)
RBC: 5.15 MIL/uL (ref 4.22–5.81)
RDW: 13.9 % (ref 11.5–15.5)
WBC: 9.2 10*3/uL (ref 4.0–10.5)
nRBC: 0 % (ref 0.0–0.2)

## 2022-06-21 LAB — ECHOCARDIOGRAM COMPLETE
AR max vel: 1.86 cm2
AV Area VTI: 1.58 cm2
AV Area mean vel: 1.76 cm2
AV Mean grad: 4 mmHg
AV Peak grad: 8.1 mmHg
Ao pk vel: 1.42 m/s
Area-P 1/2: 4.15 cm2
Height: 69 in
S' Lateral: 2.7 cm
Weight: 2880 oz

## 2022-06-21 LAB — CBG MONITORING, ED: Glucose-Capillary: 100 mg/dL — ABNORMAL HIGH (ref 70–99)

## 2022-06-21 LAB — TSH: TSH: 1.822 u[IU]/mL (ref 0.350–4.500)
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
## 2022-06-23 LAB — MISC LABCORP TEST (SEND OUT): Labcorp test code: 83935
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
## 2022-09-11 ENCOUNTER — Other Ambulatory Visit (HOSPITAL_COMMUNITY): Payer: Self-pay

## 2022-09-11 ENCOUNTER — Telehealth: Payer: Self-pay

## 2022-09-11 DIAGNOSIS — Z951 Presence of aortocoronary bypass graft: Secondary | ICD-10-CM

## 2022-09-11 DIAGNOSIS — I739 Peripheral vascular disease, unspecified: Secondary | ICD-10-CM

## 2022-09-11 DIAGNOSIS — E782 Mixed hyperlipidemia: Secondary | ICD-10-CM

## 2022-09-11 NOTE — Telephone Encounter (Signed)
Pharmacy Patient Advocate Encounter   Received notification from CoverMyMeds that prior authorization for REPATHA is required/requested.   Insurance verification completed.   The patient is insured through [facility] .   Per test claim: PA required; PA submitted to [facility] via CoverMyMeds Key/confirmation #/EOC [id number] Status is pending

## 2022-09-15 ENCOUNTER — Telehealth: Payer: Self-pay | Admitting: Student

## 2022-09-15 ENCOUNTER — Other Ambulatory Visit (HOSPITAL_COMMUNITY): Payer: Self-pay

## 2022-09-15 NOTE — Telephone Encounter (Signed)
Pharmacy Patient Advocate Encounter  Received notification from [facility] that Prior Authorization for Repatha has been APPROVED from 09/11/22 to 09/11/23   PA #/Case ID/Reference #: [id number]

## 2022-09-15 NOTE — Telephone Encounter (Signed)
Return pt's call. Stated hehas been having intermittent blurred vision in his right eye; x 2 months. Also confirmed having h/a's. No available appts in the next 2 weeks at this time. Advised pt to go to an UC - stated he will "I guess I have no choice".

## 2022-09-15 NOTE — Telephone Encounter (Signed)
Pt requesting a call back. Pt reporting having blurred vision that come and goes for about 2 months in his right eye and is concerned. Pt is aware we do not have any open appts for today.

## 2022-09-16 ENCOUNTER — Encounter: Payer: Self-pay | Admitting: Internal Medicine

## 2022-09-16 ENCOUNTER — Other Ambulatory Visit (HOSPITAL_COMMUNITY): Payer: Self-pay

## 2022-09-16 ENCOUNTER — Ambulatory Visit (INDEPENDENT_AMBULATORY_CARE_PROVIDER_SITE_OTHER): Payer: Medicaid Other | Admitting: Internal Medicine

## 2022-09-16 VITALS — BP 127/70 | HR 77 | Temp 97.6°F | Ht 71.0 in | Wt 184.5 lb

## 2022-09-16 DIAGNOSIS — G43809 Other migraine, not intractable, without status migrainosus: Secondary | ICD-10-CM

## 2022-09-16 DIAGNOSIS — Z951 Presence of aortocoronary bypass graft: Secondary | ICD-10-CM

## 2022-09-16 DIAGNOSIS — H539 Unspecified visual disturbance: Secondary | ICD-10-CM

## 2022-09-16 DIAGNOSIS — R519 Headache, unspecified: Secondary | ICD-10-CM | POA: Diagnosis not present

## 2022-09-16 DIAGNOSIS — I10 Essential (primary) hypertension: Secondary | ICD-10-CM | POA: Diagnosis not present

## 2022-09-16 DIAGNOSIS — F17210 Nicotine dependence, cigarettes, uncomplicated: Secondary | ICD-10-CM | POA: Diagnosis not present

## 2022-09-16 DIAGNOSIS — I25810 Atherosclerosis of coronary artery bypass graft(s) without angina pectoris: Secondary | ICD-10-CM | POA: Diagnosis not present

## 2022-09-16 DIAGNOSIS — R7303 Prediabetes: Secondary | ICD-10-CM

## 2022-09-16 DIAGNOSIS — E782 Mixed hyperlipidemia: Secondary | ICD-10-CM

## 2022-09-16 DIAGNOSIS — I739 Peripheral vascular disease, unspecified: Secondary | ICD-10-CM

## 2022-09-16 LAB — GLUCOSE, CAPILLARY: Glucose-Capillary: 150 mg/dL — ABNORMAL HIGH (ref 70–99)

## 2022-09-16 LAB — POCT GLYCOSYLATED HEMOGLOBIN (HGB A1C): Hemoglobin A1C: 6.2 % — AB (ref 4.0–5.6)

## 2022-09-16 MED ORDER — TOPIRAMATE 25 MG PO TABS
25.0000 mg | ORAL_TABLET | Freq: Every day | ORAL | 0 refills | Status: DC
Start: 2022-09-16 — End: 2023-02-18
  Filled 2022-09-16: qty 56, 30d supply, fill #0

## 2022-09-16 MED ORDER — CARVEDILOL 3.125 MG PO TABS
3.1250 mg | ORAL_TABLET | Freq: Two times a day (BID) | ORAL | 11 refills | Status: DC
Start: 2022-09-16 — End: 2023-11-10
  Filled 2022-09-16 (×2): qty 60, 30d supply, fill #0
  Filled 2023-05-06: qty 60, 30d supply, fill #1
  Filled 2023-08-13: qty 60, 30d supply, fill #2

## 2022-09-16 MED ORDER — ATORVASTATIN CALCIUM 80 MG PO TABS
80.0000 mg | ORAL_TABLET | Freq: Every day | ORAL | 3 refills | Status: AC
Start: 2022-09-16 — End: ?
  Filled 2022-09-16 (×2): qty 90, 90d supply, fill #0

## 2022-09-16 NOTE — Telephone Encounter (Signed)
Called pt to see if he wants to come in today if he did not go to UC since we have a certain amount of available appts today - no answer; left message.
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
## 2022-09-16 NOTE — Patient Instructions (Signed)
[name] [name], it was a pleasure seeing you today! You endorsed feeling well today. Below are some of the things we talked about this visit. We look forward to seeing you in the follow up appointment!  Today we discussed: We will start a medicine for your headaches. It is called topamax; please take 25 mg orally once daily in the evening for first week, 25 mg twice daily for second week, 25 mg in the morning and 50 mg in the evening for third week, and then 50 mg twice daily  We will restart your other medicines as well.  I will refer you to a brain and eye doctor.   I have ordered the following labs today:  Lab Orders         Glucose, capillary         Sed Rate (ESR)         POC Hbg A1C       Referrals ordered today:   Referral Orders         Ambulatory referral to Ophthalmology         Ambulatory referral to Neurology      I have ordered the following medication/changed the following medications:   Stop the following medications: Medications Discontinued During This Encounter  Medication Reason   atorvastatin (LIPITOR) 80 MG tablet Reorder     Start the following medications: Meds ordered this encounter  Medications   topiramate (TOPAMAX) 25 MG tablet    Sig: Take 1 tablet (25 mg total) by mouth at bedtime. 25 mg orally once daily in the evening for first week, 25 mg twice daily for second week, 25 mg in the morning and 50 mg in the evening for third week, and then 50 mg twice daily    Dispense:  56 tablet    Refill:  0   atorvastatin (LIPITOR) 80 MG tablet    Sig: Take 1 tablet (80 mg total) by mouth at bedtime.    Dispense:  90 tablet    Refill:  3   carvedilol (COREG) 3.125 MG tablet    Sig: Take 1 tablet (3.125 mg total) by mouth 2 (two) times daily.    Dispense:  60 tablet    Refill:  11     Follow-up: 2 week follow up    Please make sure to arrive 15 minutes prior to your next appointment. If you arrive late, you may be asked to reschedule.   We look  forward to seeing you next time. Please call our clinic at [phone] if you have any questions or concerns. The best time to call is Monday-Friday from 9am-4pm, but there is someone available 24/7. If after hours or the weekend, call the main hospital number and ask for the Internal Medicine Resident On-Call. If you need medication refills, please notify your pharmacy one week in advance and they will send [place] a request.  Thank you for letting us take part in your care. Wishing you the best!  Thank you, Gwenevere Abbot, MD

## 2022-09-17 ENCOUNTER — Other Ambulatory Visit: Payer: Self-pay

## 2022-09-17 ENCOUNTER — Other Ambulatory Visit (HOSPITAL_COMMUNITY): Payer: Self-pay

## 2022-09-17 LAB — SEDIMENTATION RATE: Sed Rate: 8 mm/h (ref 0–30)

## 2022-09-17 MED ORDER — REPATHA SURECLICK 140 MG/ML ~~LOC~~ SOAJ
1.0000 mL | SUBCUTANEOUS | 11 refills | Status: DC
Start: 2022-09-17 — End: 2023-04-12
  Filled 2022-09-23: qty 2, 28d supply, fill #0

## 2022-09-17 NOTE — Addendum Note (Signed)
Addended by: Cheree Ditto on: 09/17/2022 07:31 AM   Modules accepted: Orders

## 2022-09-18 DIAGNOSIS — R519 Headache, unspecified: Secondary | ICD-10-CM | POA: Insufficient documentation

## 2022-09-18 NOTE — Assessment & Plan Note (Addendum)
Pt with intermittent headaches since 05/2022. Reports headache is one sided and has a throbbing nature to it. He reports blurriness of the vision that last 30 seconds to 1 minute prior to onset of his headache. The vision symptoms are always followed by the headache. States headache mildly relieved by NSAIDs but resolves after sleeping. Dark environment help his headache. He is not able to state if he has photophobia and phonophobia but states they don't help his headache. He had an MRI that showed an area of hyperintensity that was non-specific but can be seen in migraine. Pt's symptoms are consistent with migraine and MRI does not suggest a structural cause. GCA was considered given headache and visual symptoms but ruled out with normal ESR. Will treat pt with topamax and follow up in 2 weeks. Will also initiate neurology referral and ophthalmology referral.

## 2022-09-18 NOTE — Assessment & Plan Note (Signed)
A1c was 6.2 this visit. Continue lifestyle modifications.

## 2022-09-18 NOTE — Assessment & Plan Note (Addendum)
Advised to follow up with Dr. Allyson Sabal office. Advised to restart medications including lipitor 80 mg, Repatha, aspirin 81 mg and coreg 3.125 mg BID. Pt has been non-adherent to all of his medications. Will restart his medications and plan to repeat lipid panel at next visit. Pt states he is in the process of completing paper work for repatha. Advised to reach out to his cardiology office.

## 2022-09-18 NOTE — Assessment & Plan Note (Addendum)
Pt's blood pressure is well controlled. He is supposed to be on Coreg for cardiac benefits given significant cardiac hx. Restart coreg 3.125 mg BID. Advised to monitor BP and if low <100/60 call our office. Advised to call his cardiologist to make a follow up appointment as he is suppose to be seen every 6 months. Normal renal fxn noted in 06/2022.

## 2022-09-22 NOTE — Addendum Note (Signed)
Addended by: Earl Lagos on: 09/22/2022 04:42 PM   Modules accepted: Level of Service

## 2022-09-22 NOTE — Progress Notes (Signed)
Internal Medicine Clinic Attending  Case discussed with the resident at the time of the visit.  We reviewed the resident's history and exam and pertinent patient test results.  I agree with the assessment, diagnosis, and plan of care documented in the resident's note.  

## 2022-09-23 ENCOUNTER — Ambulatory Visit: Payer: Medicaid Other | Admitting: Neurology

## 2022-09-23 ENCOUNTER — Other Ambulatory Visit (HOSPITAL_COMMUNITY): Payer: Self-pay

## 2022-09-23 ENCOUNTER — Encounter: Payer: Self-pay | Admitting: Neurology

## 2022-09-23 VITALS — BP 138/77 | HR 51 | Ht 69.0 in | Wt 183.0 lb

## 2022-09-23 DIAGNOSIS — G8929 Other chronic pain: Secondary | ICD-10-CM | POA: Diagnosis not present

## 2022-09-23 DIAGNOSIS — R519 Headache, unspecified: Secondary | ICD-10-CM | POA: Diagnosis not present

## 2022-09-23 DIAGNOSIS — H538 Other visual disturbances: Secondary | ICD-10-CM

## 2022-09-23 NOTE — Patient Instructions (Addendum)
Will obtain ESR CRP to look for inflammatory markers Trial of magnesium 400 mg nightly, riboflavin 400 mg nightly and co-Q10 100 mg nightly for headache preventive medications Use Tylenol as needed for the headache, but no more than 3 days a week Follow up with ophthalmology as scheduled  Follow-up in 6 months or sooner if worse.
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
## 2022-09-24 LAB — C-REACTIVE PROTEIN: CRP: 3 mg/L (ref 0–10)

## 2022-09-24 LAB — SEDIMENTATION RATE: Sed Rate: 25 mm/h (ref 0–30)

## 2023-02-12 ENCOUNTER — Other Ambulatory Visit: Payer: Self-pay

## 2023-02-12 ENCOUNTER — Ambulatory Visit (INDEPENDENT_AMBULATORY_CARE_PROVIDER_SITE_OTHER): Payer: Medicaid Other | Admitting: Sports Medicine

## 2023-02-12 ENCOUNTER — Encounter: Payer: Self-pay | Admitting: Sports Medicine

## 2023-02-12 DIAGNOSIS — M1611 Unilateral primary osteoarthritis, right hip: Secondary | ICD-10-CM

## 2023-02-12 DIAGNOSIS — R29898 Other symptoms and signs involving the musculoskeletal system: Secondary | ICD-10-CM

## 2023-02-12 DIAGNOSIS — G8929 Other chronic pain: Secondary | ICD-10-CM

## 2023-02-12 DIAGNOSIS — M25551 Pain in right hip: Secondary | ICD-10-CM | POA: Diagnosis not present

## 2023-02-12 MED ORDER — BETAMETHASONE SOD PHOS & ACET 6 (3-3) MG/ML IJ SUSP
6.0000 mg | INTRAMUSCULAR | Status: AC | PRN
Start: 2023-02-12 — End: 2023-02-12
  Administered 2023-02-12: 6 mg via INTRA_ARTICULAR

## 2023-02-12 MED ORDER — LIDOCAINE HCL 1 % IJ SOLN
2.0000 mL | INTRAMUSCULAR | Status: AC | PRN
Start: 2023-02-12 — End: 2023-02-12
  Administered 2023-02-12: 2 mL

## 2023-02-12 MED ORDER — BUPIVACAINE HCL 0.25 % IJ SOLN
2.0000 mL | INTRAMUSCULAR | Status: AC | PRN
Start: 2023-02-12 — End: 2023-02-12
  Administered 2023-02-12: 2 mL via INTRA_ARTICULAR
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
## 2023-02-12 NOTE — Progress Notes (Signed)
Patient says that he got good relief from his last injection, but his pain flared up again about 1 week ago. He says that he is having the same pain now that he was having previously in the side of his hip. He does not get relief with Tylenol, and gets minimal relief with Aleve but does not want to take much because of his heart condition. He has not had any new falls or injuries.

## 2023-02-18 ENCOUNTER — Ambulatory Visit (HOSPITAL_COMMUNITY)
Admission: RE | Admit: 2023-02-18 | Discharge: 2023-02-18 | Disposition: A | Discharge status: Home / Self Care | Payer: Medicaid Other | Source: Ambulatory Visit | Attending: Internal Medicine | Admitting: Internal Medicine

## 2023-02-18 ENCOUNTER — Other Ambulatory Visit (HOSPITAL_COMMUNITY): Payer: Self-pay

## 2023-02-18 ENCOUNTER — Ambulatory Visit (INDEPENDENT_AMBULATORY_CARE_PROVIDER_SITE_OTHER): Payer: Medicaid Other | Admitting: Internal Medicine

## 2023-02-18 VITALS — BP 134/84 | HR 88 | Temp 98.0°F | Ht 69.0 in | Wt 190.3 lb

## 2023-02-18 DIAGNOSIS — R519 Headache, unspecified: Secondary | ICD-10-CM | POA: Diagnosis not present

## 2023-02-18 DIAGNOSIS — M1611 Unilateral primary osteoarthritis, right hip: Secondary | ICD-10-CM

## 2023-02-18 DIAGNOSIS — M160 Bilateral primary osteoarthritis of hip: Secondary | ICD-10-CM | POA: Diagnosis not present

## 2023-02-18 DIAGNOSIS — L0291 Cutaneous abscess, unspecified: Secondary | ICD-10-CM

## 2023-02-18 DIAGNOSIS — E782 Mixed hyperlipidemia: Secondary | ICD-10-CM

## 2023-02-18 DIAGNOSIS — L02412 Cutaneous abscess of left axilla: Secondary | ICD-10-CM

## 2023-02-18 DIAGNOSIS — M25551 Pain in right hip: Secondary | ICD-10-CM | POA: Diagnosis not present

## 2023-02-18 DIAGNOSIS — I739 Peripheral vascular disease, unspecified: Secondary | ICD-10-CM

## 2023-02-18 DIAGNOSIS — E785 Hyperlipidemia, unspecified: Secondary | ICD-10-CM | POA: Diagnosis not present

## 2023-02-18 DIAGNOSIS — G44209 Tension-type headache, unspecified, not intractable: Secondary | ICD-10-CM

## 2023-02-18 DIAGNOSIS — I25810 Atherosclerosis of coronary artery bypass graft(s) without angina pectoris: Secondary | ICD-10-CM

## 2023-02-18 MED ORDER — AMITRIPTYLINE HCL 10 MG PO TABS
10.0000 mg | ORAL_TABLET | Freq: Every day | ORAL | 11 refills | Status: DC
Start: 2023-02-18 — End: 2023-10-01
  Filled 2023-02-18: qty 30, 30d supply, fill #0
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
## 2023-02-18 NOTE — Patient Instructions (Addendum)
Thank you, [name] for allowing [place] to provide your care today.   Headaches This sounds like you are having a tension headache. I recommend starting a medication called amitriptyline to help. You should take this medication daily. Follow-up in 4 weeks.  Right hip pain Dr. Shon Baton plan sounds like a good one with starting PT. I have ordered an xray of your right hip with your concerns for progression of your arthritis.  Pain with swallowing I think you have a cold that is causing nasal drainage down the back of your throat. Please drink plenty of fluids and hot liquids. If these symptoms do not improve in the next 1-2 weeks, come back to clinic.  Cholesterol I am checking cholesterol today. Please restart atorvastatin. I will message the lipid clinic to see if there is a way they can help with costs of medications.  Abscess in armpit Please continue using warm compresses. If this gets worse then please go to urgent care or call clinic to see if there is an appointment.  I have ordered the following medication/changed the following medications:   Stop the following medications: Medications Discontinued During This Encounter  Medication Reason   topiramate (TOPAMAX) 25 MG tablet      Start the following medications: Meds ordered this encounter  Medications   amitriptyline (ELAVIL) 10 MG tablet    Sig: Take 1 tablet (10 mg total) by mouth at bedtime.    Dispense:  30 tablet    Refill:  11     Follow up:  4 weeks  We look forward to seeing you next time. Please call our clinic at [phone] if you have any questions or concerns. The best time to call is Monday-Friday from 9am-4pm, but there is someone available 24/7. If after hours or the weekend, call the main hospital number and ask for the Internal Medicine Resident On-Call. If you need medication refills, please notify your pharmacy one week in advance and they will send [place] a request.   Thank you for trusting me with  your care. Wishing you the best!   Rudene Christians, DO [facility]

## 2023-02-19 ENCOUNTER — Encounter: Payer: Medicaid Other | Admitting: Student

## 2023-02-19 ENCOUNTER — Encounter: Payer: Self-pay | Admitting: Sports Medicine

## 2023-02-20 DIAGNOSIS — L0291 Cutaneous abscess, unspecified: Secondary | ICD-10-CM | POA: Insufficient documentation

## 2023-02-20 NOTE — Assessment & Plan Note (Addendum)
Headaches started in 5/24. GCA unlikely with prior inflammatory markers being low. MRI with no structural causes. Topamax was not effective. Neurology recommended starting several supplements including: multivitamin, magnesium 400 mg nightly, riboflavin 400 mg nightly and co-Q10 100 mg nightly. He never picked those up. He continues to have daily headaches that wrap from his right temple into the back of his head. He denies photo and phonophobia. He is able to push through headache and function. He takes tylenol daily for hip pain but does not take NSAIDs. A:  Tension type headache P: Trial with amitriptyline 10mg  at bedtime F/u in 4 weeks

## 2023-02-20 NOTE — Assessment & Plan Note (Signed)
He follows with cardiology lipid clinic for HLD in setting of CABG x3. He cannot afford repatha and does not regularly take atorvastatin. He did stop smoking about a month ago. P: Repeat lipid panel above goal for secondary prevention. I encouraged adherence with atorvastatin. Will forward result to lipid clinic to see if there are options for help with repatha. However, would expect adherence to statin to drop LDL close to goal Lab Results  Component Value Date   CHOL 170 02/25/2022   HDL 29 (L) 02/25/2022   LDLCALC 110 (H) 02/25/2022   LDLDIRECT 134.0 03/15/2015   TRIG 177 (H) 02/25/2022   CHOLHDL 5.9 (H) 02/25/2022

## 2023-02-20 NOTE — Assessment & Plan Note (Signed)
He follows with sports medicine for chronic right hip pain. Last xray in 2023 showed mild OA. Dr. Shon Baton with [facility] completed US guided steroid injection 02/12/23 and [name] [name] has not had relief in joint like he had before. He has an appointment with PT next week to start therapy with concerns for weakened abductors contributing to pain. A: He is at point he would want surgery due to severity of pain. I think it would be reasonable to repeat xray. I doubt that OA has progressed much in last 2 years but this would allow more clarity. P: Xray of right hip

## 2023-02-20 NOTE — Assessment & Plan Note (Signed)
Started 1-2 days ago with burning in left axilla. On exam there is a small <1 cm abscess present that is not draining. He is applying warm compresses multiple times daily. Denies fever. Initially HR was elevated, however when rechecked manually it was 84. P: No systemic signs of infection. Abscess is small, no indication for antibiotics. If does not improve he will need I&D with re evaluation. Return precautions reviewed
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
## 2023-02-22 NOTE — Progress Notes (Signed)
 Internal Medicine Clinic Attending  Case discussed with the resident at the time of the visit.  We reviewed the resident's history and exam and pertinent patient test results.  I agree with the assessment, diagnosis, and plan of care documented in the resident's note.

## 2023-02-23 ENCOUNTER — Ambulatory Visit: Payer: Medicaid Other | Attending: Sports Medicine | Admitting: Physical Therapy

## 2023-02-23 DIAGNOSIS — M25551 Pain in right hip: Secondary | ICD-10-CM | POA: Insufficient documentation

## 2023-02-23 DIAGNOSIS — R29898 Other symptoms and signs involving the musculoskeletal system: Secondary | ICD-10-CM | POA: Insufficient documentation

## 2023-02-23 DIAGNOSIS — M6281 Muscle weakness (generalized): Secondary | ICD-10-CM | POA: Diagnosis present

## 2023-02-23 DIAGNOSIS — M25651 Stiffness of right hip, not elsewhere classified: Secondary | ICD-10-CM | POA: Diagnosis present

## 2023-02-23 DIAGNOSIS — G8929 Other chronic pain: Secondary | ICD-10-CM | POA: Insufficient documentation

## 2023-03-02 ENCOUNTER — Other Ambulatory Visit (HOSPITAL_COMMUNITY): Payer: Self-pay

## 2023-03-18 ENCOUNTER — Ambulatory Visit (INDEPENDENT_AMBULATORY_CARE_PROVIDER_SITE_OTHER): Payer: Medicaid Other | Admitting: Sports Medicine

## 2023-03-18 DIAGNOSIS — H539 Unspecified visual disturbance: Secondary | ICD-10-CM | POA: Diagnosis not present

## 2023-03-18 DIAGNOSIS — I739 Peripheral vascular disease, unspecified: Secondary | ICD-10-CM

## 2023-03-18 DIAGNOSIS — M160 Bilateral primary osteoarthritis of hip: Secondary | ICD-10-CM

## 2023-03-18 DIAGNOSIS — R29898 Other symptoms and signs involving the musculoskeletal system: Secondary | ICD-10-CM

## 2023-03-18 DIAGNOSIS — M25552 Pain in left hip: Secondary | ICD-10-CM | POA: Diagnosis not present

## 2023-03-18 DIAGNOSIS — M25551 Pain in right hip: Secondary | ICD-10-CM | POA: Diagnosis not present

## 2023-03-18 NOTE — Progress Notes (Unsigned)
 Patient says that after his last injection he had pain for a few days then did seem to have some temporary relief. He says that he has pain that does not allow him to get comfortable in any position to be able to sleep. He also has pain and numbness along the side of the leg from the right hip down to the right knee when he walks. This pain and numbness does not go past the knee towards the foot. He says that Tylenol does not give him any relief.
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
## 2023-03-19 ENCOUNTER — Encounter: Payer: Self-pay | Admitting: Cardiovascular Disease

## 2023-03-19 ENCOUNTER — Telehealth: Payer: Self-pay | Admitting: Cardiovascular Disease

## 2023-03-19 ENCOUNTER — Encounter: Payer: Self-pay | Admitting: Sports Medicine

## 2023-03-19 NOTE — Telephone Encounter (Signed)
 Patient calling to get make and model number of his stint. Patient state that he is having a Facilities manager. Please advise
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
## 2023-03-19 NOTE — Telephone Encounter (Signed)
 Please see notes from mychart encounter. Patient question sent to Dr. Allyson Sabal

## 2023-03-23 ENCOUNTER — Encounter: Payer: Self-pay | Admitting: Sports Medicine

## 2023-03-23 ENCOUNTER — Encounter: Payer: Self-pay | Admitting: *Deleted

## 2023-03-25 ENCOUNTER — Ambulatory Visit: Payer: Medicaid Other | Admitting: Neurology

## 2023-03-25 ENCOUNTER — Other Ambulatory Visit (HOSPITAL_COMMUNITY): Payer: Self-pay

## 2023-03-25 ENCOUNTER — Encounter: Payer: Self-pay | Admitting: Neurology

## 2023-03-25 VITALS — BP 135/81 | HR 75 | Ht 69.0 in | Wt 191.0 lb

## 2023-03-25 DIAGNOSIS — R519 Headache, unspecified: Secondary | ICD-10-CM | POA: Diagnosis not present

## 2023-03-25 DIAGNOSIS — M5416 Radiculopathy, lumbar region: Secondary | ICD-10-CM

## 2023-03-25 DIAGNOSIS — G8929 Other chronic pain: Secondary | ICD-10-CM | POA: Diagnosis not present

## 2023-03-25 MED ORDER — TOPIRAMATE 50 MG PO TABS
50.0000 mg | ORAL_TABLET | Freq: Every evening | ORAL | 3 refills | Status: DC
  Filled 2023-03-25: qty 90, 90d supply, fill #0

## 2023-03-25 MED ORDER — SUMATRIPTAN SUCCINATE 50 MG PO TABS
50.0000 mg | ORAL_TABLET | ORAL | 5 refills | Status: DC | PRN
  Filled 2023-03-25: qty 10, 30d supply, fill #0

## 2023-03-25 NOTE — Patient Instructions (Signed)
 Continue current medications  Start Topiramate 50 mg, 1/2 tablet nightly for one week then increase to full tablet  Consider Aleve if severe pain  Gave patient sample of Nurtec for severe headaches  Lumbar MRI for lumbar radiculopathy  Return in 6 months or sooner if worse.
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
## 2023-04-01 ENCOUNTER — Telehealth: Payer: Self-pay | Admitting: Neurology

## 2023-04-01 NOTE — Telephone Encounter (Signed)
 [facility] [place]: [id number] exp. 04/01/23-05/30/23 sent to GI [phone]

## 2023-04-04 ENCOUNTER — Ambulatory Visit
Admission: RE | Admit: 2023-04-04 | Discharge: 2023-04-04 | Disposition: A | Discharge status: Home / Self Care | Source: Ambulatory Visit | Attending: Sports Medicine | Admitting: Sports Medicine

## 2023-04-04 DIAGNOSIS — R29898 Other symptoms and signs involving the musculoskeletal system: Secondary | ICD-10-CM

## 2023-04-04 DIAGNOSIS — M25551 Pain in right hip: Secondary | ICD-10-CM

## 2023-04-04 DIAGNOSIS — M25552 Pain in left hip: Secondary | ICD-10-CM | POA: Diagnosis not present

## 2023-04-12 ENCOUNTER — Encounter: Payer: Self-pay | Admitting: Cardiovascular Disease

## 2023-04-12 ENCOUNTER — Other Ambulatory Visit (HOSPITAL_COMMUNITY): Payer: Self-pay

## 2023-04-12 ENCOUNTER — Ambulatory Visit: Payer: Medicaid Other | Attending: Cardiovascular Disease | Admitting: Cardiovascular Disease

## 2023-04-12 VITALS — BP 126/86 | HR 66 | Ht 69.0 in | Wt 190.0 lb

## 2023-04-12 DIAGNOSIS — F17200 Nicotine dependence, unspecified, uncomplicated: Secondary | ICD-10-CM | POA: Diagnosis not present

## 2023-04-12 DIAGNOSIS — E782 Mixed hyperlipidemia: Secondary | ICD-10-CM | POA: Diagnosis not present

## 2023-04-12 DIAGNOSIS — I10 Essential (primary) hypertension: Secondary | ICD-10-CM | POA: Diagnosis not present

## 2023-04-12 DIAGNOSIS — I739 Peripheral vascular disease, unspecified: Secondary | ICD-10-CM

## 2023-04-12 DIAGNOSIS — Z951 Presence of aortocoronary bypass graft: Secondary | ICD-10-CM | POA: Diagnosis not present

## 2023-04-12 MED ORDER — REPATHA SURECLICK 140 MG/ML ~~LOC~~ SOAJ
1.0000 mL | SUBCUTANEOUS | 11 refills | Status: DC
Start: 2023-04-12 — End: 2023-10-11
  Filled 2023-04-12: qty 2, 28d supply, fill #0
  Filled 2023-05-06: qty 2, 28d supply, fill #1
  Filled 2023-08-13: qty 2, 28d supply, fill #2

## 2023-04-12 NOTE — Progress Notes (Signed)
 04/12/2023 [name] [name]   05-May-1970  [id number]  Primary Physician Lovie Macadamia, MD Primary Cardiologist: [name] [name]  HPI:  [name] [name] is a 53 y.o. single Caucasian male, father of one 25 year old son who is referred by [name] registered ,nurse practitioner, for peripheral vascular evaluation. I last saw him in the 02/25/2022.   He has a history of acute tobacco abuse having smoked 40-60 pack years (quit 1 month ago), treated hypertension and hyperlipidemia. She has never had a heart attack or stroke and denies chest pain or shortness of breath. He saw [name]  years ago and ultimately underwent angiography/ 05/16/13 revealing bilateral iliac disease both of which were stented with balloon expandable and self expanding stents. He enjoyed symptomatic relief for approximately one year and then developed recurrent left calf claudication. Recent Dopplers performed 05/02/15 revealed a left ABI 0.78 with a high-frequency signal in his left external iliac artery. I performed lower extremity angiography on him 06/06/15 revealing a patent left common and external iliac artery stent with a 95% stenosis at the distal edge of the proximal stent which I restented with a 9 mm x 3 cm nitinol self expanding stent. His symptoms of claudication have resolved and his Dopplers have normalized. He has in addition stop smoking by his account. He had a left main stent placed in [facility] August of last year and underwent repeat cardiac catheterization by Dr. 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  He underwent coronary artery bypass grafting x3 by Dr. [name] 04/07/2017 with a [name] to the LAD and vein to [name] and 2.  He is done well since.  He has stopped smoking.     I performed peripheral angiography and intervention on him 11/07/2017 with stenting of his right common iliac artery with a 7 mm x 29 mm long balloon  expandable stent.  The previous stent I placed in his left leg were widely patent angiographically.  His follow-up Doppler studies normalized and his claudication has resolved.   He had developed progressive right lower extremity claudication with Doppler studies performed 05/12/2019 revealing a decline in his right ABI to 0.7 with high-frequency signals in his right common and external iliac artery suggesting progression of disease and/or in-stent restenosis.  He does continue to smoke.  Unfortunately, he was out of all of his medicines for 8 months up until recently when he restarted those.  He did have a 2D echo performed 05/09/2019 revealing slightly lower EF than in the past in the 45 to 50% range with an inferoapical [name] motion abnormality.   I performed peripheral angiography on him 05/29/2019 revealing high-grade disease in his right common extra iliac artery both of which were restented.  He had a VBX stent placed in his right common iliac artery and a nitinol self-expanding stent in his external iliac artery.  His right ABI increased from 0.7 up to 0.98.  His claudication has resolved.   Since I saw him in the office a year ago he has had progressive right lower extremity discomfort consistent with claudication.  He is also seeing a neurologist and orthopedic surgeon/physiatrist.  He denies chest pain or shortness of breath.  He apparently stopped smoking a month ago.  He did have lower extremity actual Doppler studies performed/17/24 revealing a decline in his right ABI to 0.86 high-frequency signal in his right iliac artery.   Current Meds  Medication  Sig   amitriptyline (ELAVIL) 10 MG tablet Take 1 tablet (10 mg total) by mouth at bedtime.   aspirin EC 81 MG tablet Take 1 tablet (81 mg total) by mouth daily.   atorvastatin (LIPITOR) 80 MG tablet Take 1 tablet (80 mg total) by mouth at bedtime.   carvedilol (COREG) 3.125 MG tablet Take 1 tablet (3.125 mg total) by mouth 2 (two) times daily.    topiramate (TOPAMAX) 50 MG tablet Take 1 tablet (50 mg total) by mouth at bedtime.   [DISCONTINUED] Evolocumab (REPATHA SURECLICK) 140 MG/ML SOAJ Inject 140 mg into the skin every 14 (fourteen) days.     Allergies  Allergen Reactions   Codeine Other (See Comments)    Migraine  Other Reaction(s): Migraine Headache    Social History   Socioeconomic History   Marital status: Divorced    Spouse name: Not on file   Number of children: Not on file   Years of education: Not on file   Highest education level: Not on file  Occupational History   Occupation: [employer] doctor  Tobacco Use   Smoking status: Some Days    Current packs/day: 0.00    Average packs/day: 1 pack/day for 30.0 years (30.0 ttl pk-yrs)    Types: Cigarettes    Start date: 06/06/1985    Last attempt to quit: 06/07/2015    Years since quitting: 7.8   Smokeless tobacco: Former    Types: Snuff   Tobacco comments:    "used snuff in my teens"  Vaping Use   Vaping status: Former  Substance and Sexual Activity   Alcohol use: Not Currently    Comment: 06/06/2015 "might have 1 beer/month"   Drug use: No   Sexual activity: Not Currently  Other Topics Concern   Not on file  Social History Narrative   Not on file   Social Drivers of Health   Financial Resource Strain: High Risk (12/12/2019)   Overall Financial Resource Strain (CARDIA)    Difficulty of Paying Living Expenses: Very hard  Food Insecurity: No Food Insecurity (12/12/2019)   Hunger Vital Sign    Worried About Running Out of Food in the Last Year: Never true    Ran Out of Food in the Last Year: Never true  Transportation Needs: No Transportation Needs (12/12/2019)   PRAPARE - Administrator, Civil Service (Medical): No    Lack of Transportation (Non-Medical): No  Physical Activity: Not on file  Stress: Not on file  Social Connections: Not on file  Intimate Partner Violence: Not on file     Review of Systems: General: negative for chills,  fever, night sweats or weight changes.  Cardiovascular: negative for chest pain, dyspnea on exertion, edema, orthopnea, palpitations, paroxysmal nocturnal dyspnea or shortness of breath Dermatological: negative for rash Respiratory: negative for cough or wheezing Urologic: negative for hematuria Abdominal: negative for nausea, vomiting, diarrhea, bright red blood per rectum, melena, or hematemesis Neurologic: negative for visual changes, syncope, or dizziness All other systems reviewed and are otherwise negative except as noted above.    Blood pressure 126/86, pulse 66, height 5\' 9"  (1.753 m), weight 190 lb (86.2 kg), SpO2 96%.  General appearance: alert and no distress Neck: no adenopathy, no JVD, supple, symmetrical, trachea midline, thyroid not enlarged, symmetric, no tenderness/mass/nodules, and bilateral carotid bruits Lungs: clear to auscultation bilaterally Heart: regular rate and rhythm, S1, S2 normal, no murmur, click, rub or gallop Extremities: extremities normal, atraumatic, no cyanosis or edema Pulses: Diminished  pedal pulses Skin: Skin color, texture, turgor normal. No rashes or lesions Neurologic: Grossly normal  EKG EKG Interpretation Date/Time:  Monday April 12 2023 14:51:37 EDT Ventricular Rate:  66 PR Interval:  128 QRS Duration:  90 QT Interval:  382 QTC Calculation: 400 R Axis:   -4  Text Interpretation: Normal sinus rhythm Anterior infarct (cited on or before 20-Jun-2022) When compared with ECG of 20-Jun-2022 11:07, Premature ventricular complexes are no longer Present Questionable change in QRS axis T wave inversion now evident in Inferior leads Confirmed by Nanetta Batty [phone]) on 04/12/2023 3:18:49 PM    ASSESSMENT AND PLAN:   Tobacco dependence Discontinued 1 month ago  Claudication in peripheral vascular disease (HCC) History of PAD status post bilateral iliac stenting in the past most recently by myself 05/29/2019 with a VBX placed in his right  common iliac artery and a nitinol self-expanding stent in his extrailiac artery.  At that time his ABI increased from 0.7-0.98 and his claudication resolved.  He has had recurrent claudication however and his most recent Dopplers performed/17/24 revealed a decline in his right ABI to 0.86 with a high-frequency signal in his right leg distal to his stent.  He also has moderate disease on the left.  I suspect he is going to need repeat intervention.  I am going to repeat aortoiliac and lower extremity arterial Doppler studies.  HLD (hyperlipidemia) History of hyperlipidemia on high-dose statin therapy and Repatha which she takes intermittently because of cost considerations.  His last lipid profile performed 02/25/2022 revealed total cholesterol 170, LDL 110 and HDL 29.  Will repeat a fasting lipid profile.  Essential hypertension History of essential hypertension her blood pressure measured today at 126/86.  He is on carvedilol.  S/P CABG x 3 History of CAD status post coronary artery bypass grafting x 3 by Dr. [name] 04/07/2017 with [name] to the LAD, vein to OM1 in 2.  He had had left main stenting in [facility] with cath by Dr. 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  He currently denies chest pain or shortness of breath.  Carotid artery disease (HCC) History of bilateral moderate internal carotid artery stenosis by duplex ultrasound 05/06/2022.  This will be repeated.     [name] FACP,FACC,FAHA, [facility] 04/12/2023 3:35 PM

## 2023-04-12 NOTE — Assessment & Plan Note (Signed)
 History of bilateral moderate internal carotid artery stenosis by duplex ultrasound 05/06/2022.  This will be repeated.

## 2023-04-12 NOTE — Assessment & Plan Note (Signed)
Discontinued 1 month ago. 

## 2023-04-12 NOTE — Patient Instructions (Signed)
 Medication Instructions:  Your physician recommends that you continue on your current medications as directed. Please refer to the Current Medication list given to you today.  *If you need a refill on your cardiac medications before your next appointment, please call your pharmacy*   Lab Work: Your physician recommends that you return for lab work in: the next week or 2 for FASTING lipid/liver panel  If you have labs (blood work) drawn today and your tests are completely normal, you will receive your results only by: MyChart Message (if you have MyChart) OR A paper copy in the mail If you have any lab test that is abnormal or we need to change your treatment, we will call you to review the results.   Testing/Procedures: Your physician has requested that you have a carotid duplex. This test is an ultrasound of the carotid arteries in your neck. It looks at blood flow through these arteries that supply the brain with blood. Allow one hour for this exam. There are no restrictions or special instructions. This will take place at [address], [address].  Please note: We ask at that you not bring children with you during ultrasound (echo/ vascular) testing. Due to room size and safety concerns, children are not allowed in the ultrasound rooms during exams. Our front office staff cannot provide observation of children in our lobby area while testing is being conducted. An adult accompanying a patient to their appointment will only be allowed in the ultrasound room at the discretion of the ultrasound technician under special circumstances. We apologize for any inconvenience.   Your physician has requested that you have an Aorta/Iliac Duplex. This will be take place at [address] [facility], [address].  No food after 11PM the night before.  Water is OK. (Don't drink liquids if you have been instructed not to for ANOTHER test) Avoid foods that produce bowel gas, for 24 hours prior to exam (see  below). No breakfast, no chewing gum, no smoking or carbonated beverages. Patient may take morning medications with water. Come in for test at least 15 minutes early to register.  Please note: We ask at that you not bring children with you during ultrasound (echo/ vascular) testing. Due to room size and safety concerns, children are not allowed in the ultrasound rooms during exams. Our front office staff cannot provide observation of children in our lobby area while testing is being conducted. An adult accompanying a patient to their appointment will only be allowed in the ultrasound room at the discretion of the ultrasound technician under special circumstances. We apologize for any inconvenience.  Your physician has requested that you have an ankle brachial index (ABI). During this test an ultrasound and blood pressure cuff are used to evaluate the arteries that supply the arms and legs with blood. Allow thirty minutes for this exam. There are no restrictions or special instructions. This will take place at [address] [facility], [address].    Please note: We ask at that you not bring children with you during ultrasound (echo/ vascular) testing. Due to room size and safety concerns, children are not allowed in the ultrasound rooms during exams. Our front office staff cannot provide observation of children in our lobby area while testing is being conducted. An adult accompanying a patient to their appointment will only be allowed in the ultrasound room at the discretion of the ultrasound technician under special circumstances. We apologize for any inconvenience.    Follow-Up: At [facility],  you and your health needs are our priority.  As part of our continuing mission to provide you with exceptional heart care, we have created designated Provider Care Teams.  These Care Teams include your primary Cardiologist (physician) and Advanced Practice Providers (APPs -  Physician Assistants and Nurse  Practitioners) who all work together to provide you with the care you need, when you need it.  We recommend signing up for the patient portal called "MyChart".  Sign up information is provided on this After Visit Summary.  MyChart is used to connect with patients for Virtual Visits (Telemedicine).  Patients are able to view lab/test results, encounter notes, upcoming appointments, etc.  Non-urgent messages can be sent to your provider as well.   To learn more about what you can do with MyChart, go to [email or web address].    Your next appointment:   6 week(s)  Provider:   Nanetta Batty, MD     Other Instructions   1st Floor: - Lobby - Registration  - Pharmacy  - Lab - Cafe  2nd Floor: - PV Lab - Diagnostic Testing (echo, CT, nuclear med)  3rd Floor: - Vacant  4th Floor: - TCTS (cardiothoracic surgery) - AFib Clinic - Structural Heart Clinic - Vascular Surgery  - Vascular Ultrasound  5th Floor: - HeartCare Cardiology (general and EP) - Clinical Pharmacy for coumadin, hypertension, lipid, weight-loss medications, and med management appointments    Valet parking services will be available as well.

## 2023-04-12 NOTE — Assessment & Plan Note (Signed)
 History of hyperlipidemia on high-dose statin therapy and Repatha which she takes intermittently because of cost considerations.  His last lipid profile performed 02/25/2022 revealed total cholesterol 170, LDL 110 and HDL 29.  Will repeat a fasting lipid profile.

## 2023-04-12 NOTE — Assessment & Plan Note (Signed)
 History of PAD status post bilateral iliac stenting in the past most recently by myself 05/29/2019 with a VBX placed in his right common iliac artery and a nitinol self-expanding stent in his extrailiac artery.  At that time his ABI increased from 0.7-0.98 and his claudication resolved.  He has had recurrent claudication however and his most recent Dopplers performed/17/24 revealed a decline in his right ABI to 0.86 with a high-frequency signal in his right leg distal to his stent.  He also has moderate disease on the left.  I suspect he is going to need repeat intervention.  I am going to repeat aortoiliac and lower extremity arterial Doppler studies.

## 2023-04-12 NOTE — Assessment & Plan Note (Signed)
 History of essential hypertension her blood pressure measured today at 126/86.  He is on carvedilol.

## 2023-04-12 NOTE — Assessment & Plan Note (Signed)
 History of CAD status post coronary artery bypass grafting x 3 by Dr. [name] 04/07/2017 with [name] to the LAD, vein to OM1 in 2.  He had had left main stenting in [facility] with cath by Dr. 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  He currently denies chest pain or shortness of breath.

## 2023-04-17 ENCOUNTER — Ambulatory Visit
Admission: RE | Admit: 2023-04-17 | Discharge: 2023-04-17 | Disposition: A | Discharge status: Home / Self Care | Source: Ambulatory Visit | Attending: Neurology | Admitting: Neurology

## 2023-04-17 DIAGNOSIS — M5416 Radiculopathy, lumbar region: Secondary | ICD-10-CM

## 2023-04-19 ENCOUNTER — Encounter: Payer: Self-pay | Admitting: Neurology

## 2023-05-10 ENCOUNTER — Ambulatory Visit (HOSPITAL_BASED_OUTPATIENT_CLINIC_OR_DEPARTMENT_OTHER)
Admission: RE | Admit: 2023-05-10 | Discharge: 2023-05-10 | Disposition: A | Discharge status: Home / Self Care | Source: Ambulatory Visit | Attending: Cardiovascular Disease | Admitting: Cardiovascular Disease

## 2023-05-10 ENCOUNTER — Ambulatory Visit (HOSPITAL_COMMUNITY)
Admission: RE | Admit: 2023-05-10 | Discharge: 2023-05-10 | Disposition: A | Discharge status: Home / Self Care | Source: Ambulatory Visit | Attending: Cardiovascular Disease | Admitting: Cardiovascular Disease

## 2023-05-10 DIAGNOSIS — I739 Peripheral vascular disease, unspecified: Secondary | ICD-10-CM

## 2023-05-10 DIAGNOSIS — I6523 Occlusion and stenosis of bilateral carotid arteries: Secondary | ICD-10-CM

## 2023-05-10 DIAGNOSIS — E782 Mixed hyperlipidemia: Secondary | ICD-10-CM

## 2023-05-10 DIAGNOSIS — Z95820 Peripheral vascular angioplasty status with implants and grafts: Secondary | ICD-10-CM | POA: Insufficient documentation

## 2023-05-11 LAB — VAS US ABI WITH/WO TBI
Left ABI: 0.99
Right ABI: 0.78

## 2023-05-24 ENCOUNTER — Ambulatory Visit: Attending: Cardiovascular Disease | Admitting: Cardiovascular Disease

## 2023-05-24 ENCOUNTER — Encounter: Payer: Self-pay | Admitting: Cardiovascular Disease

## 2023-05-24 VITALS — BP 148/78 | HR 75 | Ht 69.0 in | Wt 188.0 lb

## 2023-05-24 DIAGNOSIS — Z951 Presence of aortocoronary bypass graft: Secondary | ICD-10-CM | POA: Insufficient documentation

## 2023-05-24 DIAGNOSIS — I739 Peripheral vascular disease, unspecified: Secondary | ICD-10-CM | POA: Diagnosis not present

## 2023-05-24 DIAGNOSIS — I6523 Occlusion and stenosis of bilateral carotid arteries: Secondary | ICD-10-CM | POA: Insufficient documentation

## 2023-05-24 NOTE — Progress Notes (Signed)
 [name] [name] returns today for discussion about his outpatient noninvasive studies.  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  His lower extremity arterial Doppler studies revealed a slight decrease in his right ABI with high-frequency signal in his right external iliac artery.  He is symptomatic on that side.  He has moderately elevated frequency in his left external iliac artery but he is minimally symptomatic on that side.  I am going to refer him to vascular surgery for evaluation of right carotid intervention after which I will address his right common and external iliac artery.  I am going to get a Lexiscan Myoview to her stratify him.   Avanell Leigh, M.D., FACP, [facility], [facility] [address] [place], [place]  [address]  [phone] 05/24/2023 3:15 PM

## 2023-05-24 NOTE — Patient Instructions (Signed)
 Medication Instructions:  Your physician recommends that you continue on your current medications as directed. Please refer to the Current Medication list given to you today.  *If you need a refill on your cardiac medications before your next appointment, please call your pharmacy*   Testing/Procedures: Dr. Katheryne Pane has ordered a Lexiscan Myocardial Perfusion Imaging Study.  Please arrive 15 minutes prior to your appointment time for registration and insurance purposes.   The test will take approximately 3 to 4 hours to complete; you may bring reading material.  If someone comes with you to your appointment, they will need to remain in the main lobby due to limited space in the testing area. **If you are pregnant or breastfeeding, please notify the nuclear lab prior to your appointment**   How to prepare for your Myocardial Perfusion Test: Do not eat or drink 3 hours prior to your test, except you may have water. Do not consume products containing caffeine (regular or decaffeinated) 12 hours prior to your test. (ex: coffee, chocolate, sodas, tea). Do wear comfortable clothes (no dresses or overalls) and walking shoes, tennis shoes preferred (No heels or open toe shoes are allowed). Do NOT wear cologne, perfume, aftershave, or lotions (deodorant is allowed). If you use an inhaler, use it the AM of your test and bring it with you.  If you use a nebulizer, use it the AM of your test.  If these instructions are not followed, your test will have to be rescheduled.   Follow-Up: At [facility], you and your health needs are our priority.  As part of our continuing mission to provide you with exceptional heart care, our providers are all part of one team.  This team includes your primary Cardiologist (physician) and Advanced Practice Providers or APPs (Physician Assistants and Nurse Practitioners) who all work together to provide you with the care you need, when you need it.  Your next  appointment:   3-4 month(s)  Provider:   Lauro Portal, MD    We recommend signing up for the patient portal called "MyChart".  Sign up information is provided on this After Visit Summary.  MyChart is used to connect with patients for Virtual Visits (Telemedicine).  Patients are able to view lab/test results, encounter notes, upcoming appointments, etc.  Non-urgent messages can be sent to your provider as well.   To learn more about what you can do with MyChart, go to [email or web address].

## 2023-05-31 ENCOUNTER — Other Ambulatory Visit: Payer: Self-pay | Admitting: Cardiovascular Disease

## 2023-05-31 DIAGNOSIS — I739 Peripheral vascular disease, unspecified: Secondary | ICD-10-CM

## 2023-05-31 DIAGNOSIS — I6523 Occlusion and stenosis of bilateral carotid arteries: Secondary | ICD-10-CM

## 2023-05-31 DIAGNOSIS — Z951 Presence of aortocoronary bypass graft: Secondary | ICD-10-CM
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
## 2023-06-01 ENCOUNTER — Ambulatory Visit: Attending: Vascular Surgery | Admitting: Vascular Surgery

## 2023-06-01 ENCOUNTER — Encounter: Payer: Self-pay | Admitting: Vascular Surgery

## 2023-06-01 VITALS — BP 133/78 | HR 67 | Temp 98.2°F | Resp 18 | Ht 69.0 in | Wt 188.0 lb

## 2023-06-01 DIAGNOSIS — I6523 Occlusion and stenosis of bilateral carotid arteries: Secondary | ICD-10-CM | POA: Diagnosis not present

## 2023-06-02 ENCOUNTER — Other Ambulatory Visit: Payer: Self-pay

## 2023-06-02 DIAGNOSIS — I6523 Occlusion and stenosis of bilateral carotid arteries: Secondary | ICD-10-CM

## 2023-06-03 ENCOUNTER — Ambulatory Visit (HOSPITAL_COMMUNITY): Attending: Cardiovascular Disease

## 2023-06-03 DIAGNOSIS — I6523 Occlusion and stenosis of bilateral carotid arteries: Secondary | ICD-10-CM

## 2023-06-03 DIAGNOSIS — I739 Peripheral vascular disease, unspecified: Secondary | ICD-10-CM

## 2023-06-03 DIAGNOSIS — Z951 Presence of aortocoronary bypass graft: Secondary | ICD-10-CM

## 2023-06-03 MED ORDER — TECHNETIUM TC 99M TETROFOSMIN IV KIT
10.7000 | PACK | Freq: Once | INTRAVENOUS | Status: AC | PRN
  Administered 2023-06-03: 10.7 via INTRAVENOUS

## 2023-06-03 MED ORDER — REGADENOSON 0.4 MG/5ML IV SOLN
INTRAVENOUS | Status: AC
Start: 2023-06-03 — End: ?
  Filled 2023-06-03: qty 5

## 2023-06-03 MED ORDER — REGADENOSON 0.4 MG/5ML IV SOLN
0.4000 mg | Freq: Once | INTRAVENOUS | Status: AC
  Administered 2023-06-03: 0.4 mg via INTRAVENOUS

## 2023-06-03 MED ORDER — TECHNETIUM TC 99M TETROFOSMIN IV KIT
31.7000 | PACK | Freq: Once | INTRAVENOUS | Status: AC | PRN
  Administered 2023-06-03: 31.7 via INTRAVENOUS

## 2023-06-04 ENCOUNTER — Ambulatory Visit: Payer: Self-pay | Admitting: Cardiovascular Disease

## 2023-06-04 LAB — MYOCARDIAL PERFUSION IMAGING
LV dias vol: 124 mL (ref 62–150)
LV sys vol: 56 mL
Nuc Stress EF: 55 %
Peak HR: 111 {beats}/min
Rest HR: 71 {beats}/min
Rest Nuclear Isotope Dose: 10.7 mCi
SDS: 0
SRS: 1
SSS: 0
ST Depression (mm): 0 mm
Stress Nuclear Isotope Dose: 31.7 mCi
TID: 1.06

## 2023-06-15 ENCOUNTER — Telehealth: Payer: Self-pay | Admitting: Cardiovascular Disease

## 2023-06-15 ENCOUNTER — Encounter: Payer: Self-pay | Admitting: Cardiovascular Disease

## 2023-06-15 NOTE — Telephone Encounter (Signed)
 Caller Tanya Fantasia) stated they will need to have patient's servicing facility for GI CT test updated to [facility].  Auth# [id number].

## 2023-06-15 NOTE — Telephone Encounter (Signed)
 Looks like this was ordered by [facility], will forward.

## 2023-06-15 NOTE — Telephone Encounter (Signed)
 Additional - Caller Tanya Fantasia) stated update to patient's servicing facility will need to be changed by 12 noon today or patient will need to be rescheduled.

## 2023-06-16 ENCOUNTER — Other Ambulatory Visit

## 2023-06-17 ENCOUNTER — Ambulatory Visit
Admission: RE | Admit: 2023-06-17 | Discharge: 2023-06-17 | Disposition: A | Discharge status: Home / Self Care | Source: Ambulatory Visit | Attending: Vascular Surgery

## 2023-06-17 DIAGNOSIS — I6523 Occlusion and stenosis of bilateral carotid arteries: Secondary | ICD-10-CM | POA: Diagnosis not present

## 2023-06-17 DIAGNOSIS — I6501 Occlusion and stenosis of right vertebral artery: Secondary | ICD-10-CM | POA: Diagnosis not present

## 2023-06-17 MED ORDER — IOPAMIDOL (ISOVUE-370) INJECTION 76%
75.0000 mL | Freq: Once | INTRAVENOUS | Status: AC | PRN
Start: 2023-06-17 — End: 2023-06-17
  Administered 2023-06-17: 75 mL via INTRAVENOUS

## 2023-06-29 ENCOUNTER — Ambulatory Visit: Attending: Vascular Surgery | Admitting: Vascular Surgery

## 2023-06-29 ENCOUNTER — Other Ambulatory Visit (HOSPITAL_COMMUNITY): Payer: Self-pay

## 2023-06-29 ENCOUNTER — Encounter: Payer: Self-pay | Admitting: Vascular Surgery

## 2023-06-29 VITALS — BP 119/77 | HR 64 | Temp 98.0°F | Resp 18 | Ht 69.0 in | Wt 186.3 lb

## 2023-06-29 DIAGNOSIS — I6523 Occlusion and stenosis of bilateral carotid arteries: Secondary | ICD-10-CM | POA: Insufficient documentation

## 2023-06-29 MED ORDER — CLOPIDOGREL BISULFATE 75 MG PO TABS
75.0000 mg | ORAL_TABLET | Freq: Every day | ORAL | 6 refills | Status: AC
  Filled 2023-06-29: qty 30, 30d supply, fill #0
  Filled 2023-08-13: qty 30, 30d supply, fill #1
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
## 2023-06-30 ENCOUNTER — Other Ambulatory Visit: Payer: Self-pay

## 2023-06-30 DIAGNOSIS — I6523 Occlusion and stenosis of bilateral carotid arteries: Secondary | ICD-10-CM

## 2023-07-01 ENCOUNTER — Other Ambulatory Visit (HOSPITAL_COMMUNITY): Payer: Self-pay

## 2023-07-02 ENCOUNTER — Other Ambulatory Visit (HOSPITAL_COMMUNITY): Payer: Self-pay

## 2023-07-05 ENCOUNTER — Encounter: Payer: Self-pay | Admitting: *Deleted

## 2023-07-06 ENCOUNTER — Encounter (HOSPITAL_COMMUNITY): Payer: Self-pay | Admitting: Vascular Surgery

## 2023-07-06 ENCOUNTER — Other Ambulatory Visit: Payer: Self-pay

## 2023-07-06 NOTE — Progress Notes (Signed)
 SDW call  Patient was given pre-op instructions over the phone. Patient verbalized understanding of instructions provided.     PCP - Dr. Cristine Done Cardiologist - Dr. Lauro Portal Pulmonary:    PPM/ICD - denies Device Orders - na Rep Notified - na   Chest x-ray - 06/20/2022 EKG -  04/12/2023 Stress Test - 06/03/2023 ECHO - 06/20/2022 Cardiac Cath - 04/09/2017  Sleep Study/sleep apnea/CPAP: denies  Non-diabetic  Blood Thinner Instructions: Plavix , continue, states he takes at night Aspirin  Instructions:ASA, continue  ERAS Protcol - NPO  Anesthesia review: Yes. CAD, HTN, PVD, MI, having a TCAR   Patient denies shortness of breath, fever, cough and chest pain over the phone call  Your procedure is scheduled on Wednesday July 07, 2023  Report to [facility] Main Entrance A at 0630  A.M., then check in with the Admitting office.  Call this number if you have problems the morning of surgery:  [phone]   If you have any questions prior to your surgery date call [phone]: Open Monday-Friday 8am-4pm If you experience any cold or flu symptoms such as cough, fever, chills, shortness of breath, etc. between now and your scheduled surgery, please notify us  at the above number    Remember:  Do not eat or drink after midnight the night before your surgery  Take these medicines the morning of surgery with A SIP OF WATER:  ASA, carvedilol   As of today, STOP taking any Aleve , Naproxen , Ibuprofen, Motrin, Advil, Goody's, BC's, all herbal medications, fish oil, and all vitamins.

## 2023-07-06 NOTE — Anesthesia Preprocedure Evaluation (Addendum)
 Anesthesia Evaluation  Patient identified by MRN, date of birth, ID band Patient awake    Reviewed: Allergy & Precautions, NPO status , Patient's Chart, lab work & pertinent test results  Airway Mallampati: I  TM Distance: <3 FB Neck ROM: Full    Dental  (+) Edentulous Upper, Edentulous Lower   Pulmonary former smoker   breath sounds clear to auscultation       Cardiovascular hypertension, Pt. on home beta blockers + CAD, + Past MI, + Cardiac Stents, + CABG and + Peripheral Vascular Disease   Rhythm:Regular Rate:Normal     Neuro/Psych  Headaches PSYCHIATRIC DISORDERS         GI/Hepatic Neg liver ROS,GERD  ,,  Endo/Other  negative endocrine ROS    Renal/GU negative Renal ROS  negative genitourinary   Musculoskeletal  (+) Arthritis ,    Abdominal   Peds  Hematology negative hematology ROS (+)   Anesthesia Other Findings   Reproductive/Obstetrics                             Anesthesia Physical Anesthesia Plan  ASA: 3  Anesthesia Plan: General   Post-op Pain Management: Ofirmev  IV (intra-op)*   Induction: Intravenous  PONV Risk Score and Plan: 3 and Ondansetron , Dexamethasone and Midazolam   Airway Management Planned: Oral ETT  Additional Equipment: Arterial line  Intra-op Plan:   Post-operative Plan: Extubation in OR  Informed Consent: I have reviewed the patients History and Physical, chart, labs and discussed the procedure including the risks, benefits and alternatives for the proposed anesthesia with the patient or authorized representative who has indicated his/her understanding and acceptance.     Dental advisory given  Plan Discussed with: CRNA  Anesthesia Plan Comments: (PAT note by Rudy Costain, PA-C: Follows with cardiologist Dr. Katheryne Pane for history of CAD s/p CABG x 3 in 2019, carotid stenosis, HTN, HLD, PAD s/p bilateral iliac stenting.  Echo 06/2022 showed EF 60 to  65%, normal RV function, no significant valvular abnormalities.  Last seen 04/12/2023 at that time repeat carotid duplex and lower extremity Dopplers were ordered.  Dr. Katheryne Pane commented on results of studies in note 05/24/2023 stating, Mr. Primmer returns today for discussion about his outpatient noninvasive studies.  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  His lower extremity arterial Doppler studies revealed a slight decrease in his right ABI with high-frequency signal in his right external iliac artery.  He is symptomatic on that side.  He has moderately elevated frequency in his left external iliac artery but he is minimally symptomatic on that side.  I am going to refer him to vascular surgery for evaluation of right carotid intervention after which I will address his right common and external iliac artery.  I am going to get a Lexiscan  Myoview  to her stratify him.  Stress test 06/03/2023 was low risk.  Patient subsequently seen by Dr. Fulton Job on 06/29/2023 and left TCAR was recommended.  Patient will need day of surgery labs and evaluation.  EKG 04/12/2023: Normal sinus rhythm.  Rate 66. Anterior infarct  Nuclear stress test 06/03/2023: The study is normal. The study is low risk.   No ST deviation was noted.   LV perfusion is normal. There is no evidence of ischemia. There is no evidence of infarction.   Left ventricular function is normal. Nuclear stress EF:  55%. The left ventricular ejection fraction is normal (55-65%). End diastolic cavity size is normal. End systolic cavity size is normal. No evidence of transient ischemic dilation (TID) noted.   CT images were obtained for attenuation correction and were examined for the presence of coronary calcium  when appropriate.   Coronary calcium  assessment not performed due to prior revascularization.   Prior  study available for comparison from 04/07/2013. No changes compared to prior study. Comparison made to report only.  TTE 06/20/2022: 1. Left ventricular ejection fraction, by estimation, is 60 to 65%. The  left ventricle has normal function. The left ventricle has no regional  Degraffenreid motion abnormalities. There is mild left ventricular hypertrophy of  the septal segment. Left ventricular  diastolic parameters were normal. The average left ventricular global  longitudinal strain is -18.6 %. The global longitudinal strain is normal.  2. Right ventricular systolic function is normal. The right ventricular  size is normal. There is normal pulmonary artery systolic pressure.  3. The mitral valve is normal in structure. No evidence of mitral valve  regurgitation. No evidence of mitral stenosis.  4. The aortic valve is tricuspid. Aortic valve regurgitation is not  visualized. No aortic stenosis is present.  5. The inferior vena cava is normal in size with greater than 50%  respiratory variability, suggesting right atrial pressure of 3 mmHg.   Comparison(s): EF 45-50%.   )        Anesthesia Quick Evaluation

## 2023-07-06 NOTE — Progress Notes (Signed)
 Anesthesia Chart Review: Same day workup  Follows with cardiologist Dr. Katheryne Pane for history of CAD s/p CABG x 3 in 2019, carotid stenosis, HTN, HLD, PAD s/p bilateral iliac stenting.  Echo 06/2022 showed EF 60 to 65%, normal RV function, no significant valvular abnormalities.  Last seen 04/12/2023 at that time repeat carotid duplex and lower extremity Dopplers were ordered.  Dr. Katheryne Pane commented on results of studies in note 05/24/2023 stating, [name] [name] returns today for discussion about his outpatient noninvasive studies.  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  His lower extremity arterial Doppler studies revealed a slight decrease in his right ABI with high-frequency signal in his right external iliac artery.  He is symptomatic on that side.  He has moderately elevated frequency in his left external iliac artery but he is minimally symptomatic on that side.  I am going to refer him to vascular surgery for evaluation of right carotid intervention after which I will address his right common and external iliac artery.  I am going to get a Lexiscan  Myoview  to her stratify him.  Stress test 06/03/2023 was low risk.  Patient subsequently seen by Dr. Fulton Job on 06/29/2023 and left TCAR was recommended.  Patient will need day of surgery labs and evaluation.  EKG 04/12/2023: Normal sinus rhythm.  Rate 66. Anterior infarct  Nuclear stress test 06/03/2023: The study is normal. The study is low risk.   No ST deviation was noted.   LV perfusion is normal. There is no evidence of ischemia. There is no evidence of infarction.   Left ventricular function is normal. Nuclear stress EF: 55%. The left ventricular ejection fraction is normal (55-65%). End diastolic cavity size is normal. End systolic cavity size is normal. No evidence of transient ischemic dilation (TID) noted.   CT  images were obtained for attenuation correction and were examined for the presence of coronary calcium  when appropriate.   Coronary calcium  assessment not performed due to prior revascularization.   Prior study available for comparison from 04/07/2013. No changes compared to prior study. Comparison made to report only.  TTE 06/20/2022:  1. Left ventricular ejection fraction, by estimation, is 60 to 65%. The  left ventricle has normal function. The left ventricle has no regional  Krantz motion abnormalities. There is mild left ventricular hypertrophy of  the septal segment. Left ventricular  diastolic parameters were normal. The average left ventricular global  longitudinal strain is -18.6 %. The global longitudinal strain is normal.   2. Right ventricular systolic function is normal. The right ventricular  size is normal. There is normal pulmonary artery systolic pressure.   3. The mitral valve is normal in structure. No evidence of mitral valve  regurgitation. No evidence of mitral stenosis.   4. The aortic valve is tricuspid. Aortic valve regurgitation is not  visualized. No aortic stenosis is present.   5. The inferior vena cava is normal in size with greater than 50%  respiratory variability, suggesting right atrial pressure of 3 mmHg.   Comparison(s): EF 45-50%.     Edilia Gordon [facility]/Anesthesiology Phone [phone] 07/06/2023 12:25 PM

## 2023-07-07 ENCOUNTER — Inpatient Hospital Stay (HOSPITAL_COMMUNITY)

## 2023-07-07 ENCOUNTER — Inpatient Hospital Stay (HOSPITAL_COMMUNITY)
Admission: RE | Admit: 2023-07-07 | Discharge: 2023-07-08 | DRG: 036 | Disposition: A | Discharge status: Home / Self Care | Attending: Vascular Surgery | Admitting: Vascular Surgery

## 2023-07-07 ENCOUNTER — Encounter (HOSPITAL_COMMUNITY)
Admission: RE | Disposition: A | Discharge status: Home / Self Care | Payer: Self-pay | Source: Home / Self Care | Attending: Vascular Surgery

## 2023-07-07 ENCOUNTER — Encounter (HOSPITAL_COMMUNITY): Payer: Self-pay | Admitting: Vascular Surgery

## 2023-07-07 DIAGNOSIS — I2510 Atherosclerotic heart disease of native coronary artery without angina pectoris: Secondary | ICD-10-CM

## 2023-07-07 DIAGNOSIS — Z7982 Long term (current) use of aspirin: Secondary | ICD-10-CM | POA: Diagnosis not present

## 2023-07-07 DIAGNOSIS — Z87891 Personal history of nicotine dependence: Secondary | ICD-10-CM

## 2023-07-07 DIAGNOSIS — R7303 Prediabetes: Secondary | ICD-10-CM | POA: Diagnosis present

## 2023-07-07 DIAGNOSIS — I10 Essential (primary) hypertension: Secondary | ICD-10-CM

## 2023-07-07 DIAGNOSIS — Z951 Presence of aortocoronary bypass graft: Secondary | ICD-10-CM

## 2023-07-07 DIAGNOSIS — Z79899 Other long term (current) drug therapy: Secondary | ICD-10-CM | POA: Diagnosis not present

## 2023-07-07 DIAGNOSIS — I6529 Occlusion and stenosis of unspecified carotid artery: Secondary | ICD-10-CM | POA: Diagnosis present

## 2023-07-07 DIAGNOSIS — Z823 Family history of stroke: Secondary | ICD-10-CM

## 2023-07-07 DIAGNOSIS — I252 Old myocardial infarction: Secondary | ICD-10-CM | POA: Diagnosis not present

## 2023-07-07 DIAGNOSIS — I6523 Occlusion and stenosis of bilateral carotid arteries: Secondary | ICD-10-CM

## 2023-07-07 DIAGNOSIS — Z885 Allergy status to narcotic agent status: Secondary | ICD-10-CM | POA: Diagnosis not present

## 2023-07-07 DIAGNOSIS — I6522 Occlusion and stenosis of left carotid artery: Secondary | ICD-10-CM | POA: Diagnosis not present

## 2023-07-07 DIAGNOSIS — K219 Gastro-esophageal reflux disease without esophagitis: Secondary | ICD-10-CM | POA: Diagnosis present

## 2023-07-07 DIAGNOSIS — I739 Peripheral vascular disease, unspecified: Secondary | ICD-10-CM | POA: Diagnosis present

## 2023-07-07 DIAGNOSIS — E7800 Pure hypercholesterolemia, unspecified: Secondary | ICD-10-CM | POA: Diagnosis present

## 2023-07-07 DIAGNOSIS — Z8674 Personal history of sudden cardiac arrest: Secondary | ICD-10-CM

## 2023-07-07 DIAGNOSIS — Z5986 Financial insecurity: Secondary | ICD-10-CM | POA: Diagnosis not present

## 2023-07-07 HISTORY — PX: TRANSCAROTID ARTERY REVASCULARIZATIONÂ: SHX6778

## 2023-07-07 HISTORY — DX: Acute myocardial infarction, unspecified: I21.9

## 2023-07-07 LAB — CBC
HCT: 44.8 % (ref 39.0–52.0)
Hemoglobin: 14.8 g/dL (ref 13.0–17.0)
MCH: 28.7 pg (ref 26.0–34.0)
MCHC: 33 g/dL (ref 30.0–36.0)
MCV: 86.8 fL (ref 80.0–100.0)
Platelets: 214 10*3/uL (ref 150–400)
RBC: 5.16 MIL/uL (ref 4.22–5.81)
RDW: 14.6 % (ref 11.5–15.5)
WBC: 8.8 10*3/uL (ref 4.0–10.5)
nRBC: 0 % (ref 0.0–0.2)

## 2023-07-07 LAB — COMPREHENSIVE METABOLIC PANEL WITH GFR
ALT: 14 U/L (ref 0–44)
AST: 55 U/L — ABNORMAL HIGH (ref 15–41)
Albumin: 3.4 g/dL — ABNORMAL LOW (ref 3.5–5.0)
Alkaline Phosphatase: 72 U/L (ref 38–126)
Anion gap: 8 (ref 5–15)
BUN: 11 mg/dL (ref 6–20)
CO2: 22 mmol/L (ref 22–32)
Calcium: 8.6 mg/dL — ABNORMAL LOW (ref 8.9–10.3)
Chloride: 108 mmol/L (ref 98–111)
Creatinine, Ser: 1.19 mg/dL (ref 0.61–1.24)
GFR, Estimated: >60 mL/min (ref >60)
Glucose, Bld: 122 mg/dL — ABNORMAL HIGH (ref 70–99)
Potassium: 4 mmol/L (ref 3.5–5.1)
Sodium: 138 mmol/L (ref 135–145)
Total Bilirubin: 0.6 mg/dL (ref 0.0–1.2)
Total Protein: 6.5 g/dL (ref 6.5–8.1)

## 2023-07-07 LAB — TYPE AND SCREEN
ABO/RH(D): O POS
Antibody Screen: NEGATIVE

## 2023-07-07 LAB — POCT ACTIVATED CLOTTING TIME: Activated Clotting Time: 302 s

## 2023-07-07 LAB — APTT: aPTT: 28 s (ref 24–36)

## 2023-07-07 LAB — SURGICAL PCR SCREEN
MRSA, PCR: NEGATIVE
Staphylococcus aureus: NEGATIVE

## 2023-07-07 LAB — PROTIME-INR
INR: 1 (ref 0.8–1.2)
Prothrombin Time: 13.5 s (ref 11.4–15.2)

## 2023-07-07 SURGERY — TRANSCAROTID ARTERY REVASCULARIZATION (TCAR)
Anesthesia: General | Laterality: Left

## 2023-07-07 MED ORDER — METOPROLOL TARTRATE 5 MG/5ML IV SOLN: 2.5000 mg | INTRAVENOUS | Status: DC | PRN

## 2023-07-07 MED ORDER — LACTATED RINGERS IV SOLN: INTRAVENOUS | Status: DC

## 2023-07-07 MED ORDER — CEFAZOLIN SODIUM-DEXTROSE 2-4 GM/100ML-% IV SOLN
2.0000 g | INTRAVENOUS | Status: AC
  Administered 2023-07-07: 2 g via INTRAVENOUS
  Filled 2023-07-07: qty 100

## 2023-07-07 MED ORDER — LABETALOL HCL 5 MG/ML IV SOLN
10.0000 mg | INTRAVENOUS | Status: DC | PRN
  Administered 2023-07-07 (×2): 10 mg via INTRAVENOUS
  Filled 2023-07-07: qty 4

## 2023-07-07 MED ORDER — DOCUSATE SODIUM 100 MG PO CAPS
100.0000 mg | ORAL_CAPSULE | Freq: Every day | ORAL | Status: DC
  Administered 2023-07-08: 100 mg via ORAL
  Filled 2023-07-07: qty 1

## 2023-07-07 MED ORDER — GLYCOPYRROLATE PF 0.2 MG/ML IJ SOSY
PREFILLED_SYRINGE | INTRAMUSCULAR | Status: AC
  Filled 2023-07-07: qty 1

## 2023-07-07 MED ORDER — ROCURONIUM BROMIDE 10 MG/ML (PF) SYRINGE
PREFILLED_SYRINGE | INTRAVENOUS | Status: DC | PRN
  Administered 2023-07-07: 50 mg via INTRAVENOUS
  Administered 2023-07-07: 20 mg via INTRAVENOUS

## 2023-07-07 MED ORDER — ONDANSETRON HCL 4 MG/2ML IJ SOLN: 4.0000 mg | Freq: Four times a day (QID) | INTRAMUSCULAR | Status: DC | PRN

## 2023-07-07 MED ORDER — POTASSIUM CHLORIDE CRYS ER 20 MEQ PO TBCR: 40.0000 meq | EXTENDED_RELEASE_TABLET | Freq: Every day | ORAL | Status: DC | PRN

## 2023-07-07 MED ORDER — MIDAZOLAM HCL 2 MG/2ML IJ SOLN
INTRAMUSCULAR | Status: AC
  Filled 2023-07-07: qty 2

## 2023-07-07 MED ORDER — ORAL CARE MOUTH RINSE: 15.0000 mL | Freq: Once | OROMUCOSAL | Status: AC

## 2023-07-07 MED ORDER — HEPARIN SODIUM (PORCINE) 1000 UNIT/ML IJ SOLN
INTRAMUSCULAR | Status: AC
  Filled 2023-07-07: qty 10

## 2023-07-07 MED ORDER — POLYETHYLENE GLYCOL 3350 17 G PO PACK: 17.0000 g | PACK | Freq: Every day | ORAL | Status: DC | PRN

## 2023-07-07 MED ORDER — LIDOCAINE 2% (20 MG/ML) 5 ML SYRINGE
INTRAMUSCULAR | Status: AC
  Filled 2023-07-07: qty 5

## 2023-07-07 MED ORDER — SODIUM CHLORIDE 0.9 % IV SOLN: INTRAVENOUS | Status: DC

## 2023-07-07 MED ORDER — SUGAMMADEX SODIUM 200 MG/2ML IV SOLN
INTRAVENOUS | Status: DC | PRN
  Administered 2023-07-07: 200 mg via INTRAVENOUS

## 2023-07-07 MED ORDER — MIDAZOLAM HCL 2 MG/2ML IJ SOLN
INTRAMUSCULAR | Status: DC | PRN
  Administered 2023-07-07: 2 mg via INTRAVENOUS

## 2023-07-07 MED ORDER — DEXAMETHASONE SODIUM PHOSPHATE 10 MG/ML IJ SOLN
INTRAMUSCULAR | Status: AC
  Filled 2023-07-07: qty 1

## 2023-07-07 MED ORDER — LACTATED RINGERS IV SOLN: INTRAVENOUS | Status: DC | PRN

## 2023-07-07 MED ORDER — HYDRALAZINE HCL 20 MG/ML IJ SOLN
5.0000 mg | INTRAMUSCULAR | Status: AC | PRN
  Administered 2023-07-07 (×2): 5 mg via INTRAVENOUS
  Filled 2023-07-07: qty 1

## 2023-07-07 MED ORDER — HEPARIN 6000 UNIT IRRIGATION SOLUTION
Status: AC
  Filled 2023-07-07: qty 500

## 2023-07-07 MED ORDER — CARVEDILOL 3.125 MG PO TABS
3.1250 mg | ORAL_TABLET | Freq: Two times a day (BID) | ORAL | Status: DC
  Administered 2023-07-07 – 2023-07-08 (×2): 3.125 mg via ORAL
  Filled 2023-07-07 (×2): qty 1

## 2023-07-07 MED ORDER — HYDRALAZINE HCL 20 MG/ML IJ SOLN
INTRAMUSCULAR | Status: AC
  Filled 2023-07-07: qty 1

## 2023-07-07 MED ORDER — EPHEDRINE 5 MG/ML INJ
INTRAVENOUS | Status: AC
Start: 2023-07-07 — End: 2023-07-07
  Filled 2023-07-07: qty 5

## 2023-07-07 MED ORDER — CHLORHEXIDINE GLUCONATE CLOTH 2 % EX PADS: 6.0000 | MEDICATED_PAD | Freq: Once | CUTANEOUS | Status: DC

## 2023-07-07 MED ORDER — PROPOFOL 10 MG/ML IV BOLUS
INTRAVENOUS | Status: AC
  Filled 2023-07-07: qty 20

## 2023-07-07 MED ORDER — PHENYLEPHRINE HCL-NACL 20-0.9 MG/250ML-% IV SOLN
INTRAVENOUS | Status: DC | PRN
  Administered 2023-07-07: 30 ug/min via INTRAVENOUS

## 2023-07-07 MED ORDER — CLOPIDOGREL BISULFATE 75 MG PO TABS
75.0000 mg | ORAL_TABLET | Freq: Every day | ORAL | Status: DC
  Administered 2023-07-08: 75 mg via ORAL
  Filled 2023-07-07: qty 1

## 2023-07-07 MED ORDER — IODIXANOL 320 MG/ML IV SOLN
INTRAVENOUS | Status: DC | PRN
  Administered 2023-07-07: 15 mL

## 2023-07-07 MED ORDER — OXYCODONE-ACETAMINOPHEN 5-325 MG PO TABS: 1.0000 | ORAL_TABLET | ORAL | Status: DC | PRN

## 2023-07-07 MED ORDER — ACETAMINOPHEN 10 MG/ML IV SOLN
1000.0000 mg | Freq: Once | INTRAVENOUS | Status: DC | PRN
  Administered 2023-07-07: 1000 mg via INTRAVENOUS

## 2023-07-07 MED ORDER — LABETALOL HCL 5 MG/ML IV SOLN
INTRAVENOUS | Status: AC
  Filled 2023-07-07: qty 4

## 2023-07-07 MED ORDER — OXYCODONE HCL 5 MG PO TABS: 5.0000 mg | ORAL_TABLET | Freq: Once | ORAL | Status: DC | PRN

## 2023-07-07 MED ORDER — PHENOL 1.4 % MT LIQD: 1.0000 | OROMUCOSAL | Status: DC | PRN

## 2023-07-07 MED ORDER — MORPHINE SULFATE (PF) 2 MG/ML IV SOLN: 2.0000 mg | INTRAVENOUS | Status: DC | PRN

## 2023-07-07 MED ORDER — FENTANYL CITRATE (PF) 250 MCG/5ML IJ SOLN
INTRAMUSCULAR | Status: DC | PRN
  Administered 2023-07-07: 100 ug via INTRAVENOUS
  Administered 2023-07-07 (×3): 50 ug via INTRAVENOUS

## 2023-07-07 MED ORDER — DEXAMETHASONE SODIUM PHOSPHATE 10 MG/ML IJ SOLN
INTRAMUSCULAR | Status: DC | PRN
  Administered 2023-07-07: 10 mg via INTRAVENOUS

## 2023-07-07 MED ORDER — FENTANYL CITRATE (PF) 100 MCG/2ML IJ SOLN
INTRAMUSCULAR | Status: AC
  Filled 2023-07-07: qty 2

## 2023-07-07 MED ORDER — HEPARIN 6000 UNIT IRRIGATION SOLUTION
Status: DC | PRN
  Administered 2023-07-07: 1

## 2023-07-07 MED ORDER — CHLORHEXIDINE GLUCONATE 0.12 % MT SOLN
15.0000 mL | Freq: Once | OROMUCOSAL | Status: AC
  Administered 2023-07-07: 15 mL via OROMUCOSAL
  Filled 2023-07-07: qty 15

## 2023-07-07 MED ORDER — 0.9 % SODIUM CHLORIDE (POUR BTL) OPTIME
TOPICAL | Status: DC | PRN
  Administered 2023-07-07: 1000 mL

## 2023-07-07 MED ORDER — ACETAMINOPHEN 650 MG RE SUPP: 325.0000 mg | RECTAL | Status: DC | PRN

## 2023-07-07 MED ORDER — GLYCOPYRROLATE 0.2 MG/ML IJ SOLN
INTRAMUSCULAR | Status: DC | PRN
  Administered 2023-07-07 (×2): .2 mg via INTRAVENOUS

## 2023-07-07 MED ORDER — ATORVASTATIN CALCIUM 80 MG PO TABS
80.0000 mg | ORAL_TABLET | Freq: Every day | ORAL | Status: DC
  Administered 2023-07-07: 80 mg via ORAL
  Filled 2023-07-07: qty 1

## 2023-07-07 MED ORDER — ASPIRIN 81 MG PO TBEC
81.0000 mg | DELAYED_RELEASE_TABLET | Freq: Every day | ORAL | Status: DC
  Administered 2023-07-08: 81 mg via ORAL
  Filled 2023-07-07: qty 1

## 2023-07-07 MED ORDER — ACETAMINOPHEN 10 MG/ML IV SOLN
INTRAVENOUS | Status: AC
  Filled 2023-07-07: qty 100

## 2023-07-07 MED ORDER — FENTANYL CITRATE (PF) 100 MCG/2ML IJ SOLN
25.0000 ug | INTRAMUSCULAR | Status: DC | PRN
  Administered 2023-07-07 (×2): 25 ug via INTRAVENOUS

## 2023-07-07 MED ORDER — CEFAZOLIN SODIUM-DEXTROSE 2-4 GM/100ML-% IV SOLN
2.0000 g | Freq: Three times a day (TID) | INTRAVENOUS | Status: AC
  Administered 2023-07-07 – 2023-07-08 (×2): 2 g via INTRAVENOUS
  Filled 2023-07-07 (×2): qty 100

## 2023-07-07 MED ORDER — PROPOFOL 10 MG/ML IV BOLUS
INTRAVENOUS | Status: DC | PRN
  Administered 2023-07-07: 180 mg via INTRAVENOUS

## 2023-07-07 MED ORDER — ROCURONIUM BROMIDE 10 MG/ML (PF) SYRINGE
PREFILLED_SYRINGE | INTRAVENOUS | Status: AC
  Filled 2023-07-07: qty 10

## 2023-07-07 MED ORDER — LIDOCAINE 2% (20 MG/ML) 5 ML SYRINGE
INTRAMUSCULAR | Status: DC | PRN
  Administered 2023-07-07: 60 mg via INTRAVENOUS

## 2023-07-07 MED ORDER — HEPARIN SODIUM (PORCINE) 1000 UNIT/ML IJ SOLN
INTRAMUSCULAR | Status: DC | PRN
  Administered 2023-07-07: 9000 [IU] via INTRAVENOUS

## 2023-07-07 MED ORDER — HEMOSTATIC AGENTS (NO CHARGE) OPTIME
TOPICAL | Status: DC | PRN
  Administered 2023-07-07: 1 via TOPICAL

## 2023-07-07 MED ORDER — DROPERIDOL 2.5 MG/ML IJ SOLN: 0.6250 mg | Freq: Once | INTRAMUSCULAR | Status: DC | PRN

## 2023-07-07 MED ORDER — SODIUM CHLORIDE 0.9 % IV SOLN: 500.0000 mL | Freq: Once | INTRAVENOUS | Status: DC | PRN

## 2023-07-07 MED ORDER — EPHEDRINE SULFATE-NACL 50-0.9 MG/10ML-% IV SOSY
PREFILLED_SYRINGE | INTRAVENOUS | Status: DC | PRN
  Administered 2023-07-07: 10 mg via INTRAVENOUS
  Administered 2023-07-07 (×3): 5 mg via INTRAVENOUS

## 2023-07-07 MED ORDER — PROTAMINE SULFATE 10 MG/ML IV SOLN
INTRAVENOUS | Status: AC
  Filled 2023-07-07: qty 25

## 2023-07-07 MED ORDER — PROTAMINE SULFATE 10 MG/ML IV SOLN
INTRAVENOUS | Status: DC | PRN
  Administered 2023-07-07: 5 mg via INTRAVENOUS
  Administered 2023-07-07: 45 mg via INTRAVENOUS

## 2023-07-07 MED ORDER — ONDANSETRON HCL 4 MG/2ML IJ SOLN
INTRAMUSCULAR | Status: DC | PRN
  Administered 2023-07-07: 4 mg via INTRAVENOUS

## 2023-07-07 MED ORDER — ACETAMINOPHEN 325 MG PO TABS: 325.0000 mg | ORAL_TABLET | ORAL | Status: DC | PRN

## 2023-07-07 MED ORDER — OXYCODONE HCL 5 MG/5ML PO SOLN: 5.0000 mg | Freq: Once | ORAL | Status: DC | PRN

## 2023-07-07 MED ORDER — PHENYLEPHRINE 80 MCG/ML (10ML) SYRINGE FOR IV PUSH (FOR BLOOD PRESSURE SUPPORT): PREFILLED_SYRINGE | INTRAVENOUS | Status: DC | PRN

## 2023-07-07 MED ORDER — FENTANYL CITRATE (PF) 250 MCG/5ML IJ SOLN
INTRAMUSCULAR | Status: AC
  Filled 2023-07-07: qty 5

## 2023-07-07 MED ORDER — ONDANSETRON HCL 4 MG/2ML IJ SOLN
INTRAMUSCULAR | Status: AC
  Filled 2023-07-07: qty 2

## 2023-07-07 SURGICAL SUPPLY — 37 items
BAG BANDED W/RUBBER/TAPE 36X54 (MISCELLANEOUS) ×1 IMPLANT
BAG COUNTER SPONGE SURGICOUNT (BAG) ×1 IMPLANT
CANISTER SUCTION 3000ML PPV (SUCTIONS) ×1 IMPLANT
CATH BALLN ENROUTE 5X35 (CATHETERS) IMPLANT
CLIP TI MEDIUM 6 (CLIP) ×1 IMPLANT
CLIP TI WIDE RED SMALL 6 (CLIP) ×1 IMPLANT
COVER DOME SNAP 22 D (MISCELLANEOUS) ×1 IMPLANT
COVER PROBE W GEL 5X96 (DRAPES) ×1 IMPLANT
DERMABOND ADVANCED .7 DNX12 (GAUZE/BANDAGES/DRESSINGS) ×1 IMPLANT
DRAPE FEMORAL ANGIO 80X135IN (DRAPES) ×1 IMPLANT
ELECTRODE REM PT RTRN 9FT ADLT (ELECTROSURGICAL) ×1 IMPLANT
GLOVE BIO SURGEON STRL SZ7.5 (GLOVE) ×1 IMPLANT
GOWN STRL REUS W/ TWL LRG LVL3 (GOWN DISPOSABLE) ×2 IMPLANT
GOWN STRL REUS W/ TWL XL LVL3 (GOWN DISPOSABLE) ×1 IMPLANT
GUIDEWIRE ENROUTE 0.014 (WIRE) ×1 IMPLANT
HEMOSTAT SNOW SURGICEL 2X4 (HEMOSTASIS) IMPLANT
KIT BASIN OR (CUSTOM PROCEDURE TRAY) ×1 IMPLANT
KIT ENCORE 26 ADVANTAGE (KITS) ×1 IMPLANT
KIT INTRODUCER GALT 7 (INTRODUCER) ×1 IMPLANT
KIT TURNOVER KIT B (KITS) ×1 IMPLANT
NDL HYPO 25GX1X1/2 BEV (NEEDLE) IMPLANT
NEEDLE HYPO 25GX1X1/2 BEV (NEEDLE) IMPLANT
PACK CAROTID (CUSTOM PROCEDURE TRAY) ×1 IMPLANT
POSITIONER HEAD DONUT 9IN (MISCELLANEOUS) ×1 IMPLANT
SET MICROPUNCTURE 5F STIFF (MISCELLANEOUS) ×1 IMPLANT
STENT TRANSCAROTID 9-7X40 (Permanent Stent) IMPLANT
SUT MNCRL AB 4-0 PS2 18 (SUTURE) ×1 IMPLANT
SUT PROLENE 5 0 C 1 24 (SUTURE) ×1 IMPLANT
SUT SILK 2 0 PERMA HAND 18 BK (SUTURE) ×1 IMPLANT
SUT VIC AB 3-0 SH 27X BRD (SUTURE) ×1 IMPLANT
SYR 10ML LL (SYRINGE) ×3 IMPLANT
SYR 20ML LL LF (SYRINGE) ×1 IMPLANT
SYR CONTROL 10ML LL (SYRINGE) IMPLANT
SYSTEM TRANSCAROTID NEUROPRTCT (MISCELLANEOUS) ×1 IMPLANT
TOWEL GREEN STERILE (TOWEL DISPOSABLE) ×1 IMPLANT
WATER STERILE IRR 1000ML POUR (IV SOLUTION) ×1 IMPLANT
WIRE BENTSON .035X145CM (WIRE) ×1 IMPLANT

## 2023-07-07 NOTE — Progress Notes (Signed)
 Mobility Specialist Progress Note:    07/07/23 1436  Mobility  Activity Transferred from bed to chair  Level of Assistance Standby assist, set-up cues, supervision of patient - no hands on  Assistive Device None  Distance Ambulated (ft) 2 ft  Activity Response Tolerated well  Mobility Referral Yes  Mobility visit 1 Mobility  Mobility Specialist Start Time (ACUTE ONLY) 1425  Mobility Specialist Stop Time (ACUTE ONLY) 1436  Mobility Specialist Time Calculation (min) (ACUTE ONLY) 11 min   Pt received in bed, requesting assistance to be slid up in bed. Encouraged pt to sit up in chair, pt agreeable. SV with standing at bedside, slight dizziness reported. Sat in chair without fault. Left pt in care of nursing, call bell in reach, all needs met.   Dodd Schmid Mobility Specialist Please contact via Special educational needs teacher or  Rehab office at [phone]

## 2023-07-07 NOTE — Anesthesia Procedure Notes (Signed)
 Procedure Name: Intubation Date/Time: 07/07/2023 8:49 AM  Performed by: Andee Bamberger, CRNAPre-anesthesia Checklist: Patient identified, Emergency Drugs available, Suction available and Patient being monitored Patient Re-evaluated:Patient Re-evaluated prior to induction Oxygen Delivery Method: Circle system utilized Preoxygenation: Pre-oxygenation with 100% oxygen Induction Type: IV induction Ventilation: Mask ventilation without difficulty Laryngoscope Size: Miller and 3 Tube type: Oral Tube size: 7.5 mm Number of attempts: 1 Airway Equipment and Method: Stylet and Oral airway Placement Confirmation: ETT inserted through vocal cords under direct vision, positive ETCO2 and breath sounds checked- equal and bilateral Secured at: 24 cm Tube secured with: Tape Dental Injury: Teeth and Oropharynx as per pre-operative assessment

## 2023-07-07 NOTE — Anesthesia Procedure Notes (Signed)
 Arterial Line Insertion Start/End6/18/2025 8:00 AM, 07/07/2023 8:05 AM Performed by: Willian Harrow, MD, Andee Bamberger, CRNA, CRNA  Patient location: Pre-op. Preanesthetic checklist: patient identified, IV checked, site marked, risks and benefits discussed, surgical consent, monitors and equipment checked, pre-op evaluation, timeout performed and anesthesia consent Lidocaine  1% used for infiltration radial was placed Catheter size: 20 G Hand hygiene performed  and maximum sterile barriers used   Attempts: 1 Procedure performed using ultrasound guided technique. Ultrasound Notes:anatomy identified, needle tip was noted to be adjacent to the nerve/plexus identified and no ultrasound evidence of intravascular and/or intraneural injection Following insertion, dressing applied. Post procedure assessment: normal and unchanged  Patient tolerated the procedure well with no immediate complications.

## 2023-07-07 NOTE — Progress Notes (Signed)
 Patient's blood pressure is on higher side, informed to the on call PA, patient denies any symptoms, NIH is 0, PRN antihypertensives given. Will continue to monitor.  07/07/23 1445  Vitals  BP (!) 168/91  MAP (mmHg) 108  BP Location Right Arm  BP Method Automatic  Patient Position (if appropriate) Lying  Pulse Rate 86  Pulse Rate Source Monitor  ECG Heart Rate 89  Resp 20  Level of Consciousness  Level of Consciousness Alert  MEWS COLOR  MEWS Score Color Green  MEWS Score  MEWS Temp 0  MEWS Systolic 0  MEWS Pulse 0  MEWS RR 0  MEWS LOC 0  MEWS Score 0

## 2023-07-07 NOTE — Transfer of Care (Signed)
 Immediate Anesthesia Transfer of Care Note  Patient: [name] [name]  Procedure(s) Performed: TRANSCAROTID ARTERY REVASCULARIZATION (TCAR) USING 9-54mm X 40mm ENROUTE TRANSCAROTID STENT SYSTEM (Left)  Patient Location: PACU  Anesthesia Type:General  Level of Consciousness: awake, alert , and oriented  Airway & Oxygen Therapy: Patient Spontanous Breathing and Patient connected to face mask oxygen  Post-op Assessment: Report given to RN and Post -op Vital signs reviewed and stable  Post vital signs: Reviewed and stable  Last Vitals:  Vitals Value Taken Time  BP 179/90 07/07/23 10:21  Temp    Pulse 81 07/07/23 10:24  Resp 13 07/07/23 10:24  SpO2 100 % 07/07/23 10:24  Vitals shown include unfiled device data.  Last Pain:  Vitals:   07/07/23 0703  TempSrc:   PainSc: 0-No pain         Complications: No notable events documented.

## 2023-07-07 NOTE — Anesthesia Postprocedure Evaluation (Signed)
 Anesthesia Post Note  Patient: [name]  Procedure(s) Performed: TRANSCAROTID ARTERY REVASCULARIZATION (TCAR) USING 9-20mm X 40mm ENROUTE TRANSCAROTID STENT SYSTEM (Left)     Patient location during evaluation: PACU Anesthesia Type: General Level of consciousness: awake and alert Pain management: pain level controlled Vital Signs Assessment: post-procedure vital signs reviewed and stable Respiratory status: spontaneous breathing, nonlabored ventilation, respiratory function stable and patient connected to nasal cannula oxygen Cardiovascular status: blood pressure returned to baseline and stable Postop Assessment: no apparent nausea or vomiting Anesthetic complications: no  No notable events documented.  Last Vitals:  Vitals:   07/07/23 1624 07/07/23 1625  BP:    Pulse: (!) 58 64  Resp: 11 12  Temp:    SpO2: 97% 97%    Last Pain:  Vitals:   07/07/23 1548  TempSrc: Oral  PainSc:                  Davi Rotan D Lashane Whelpley
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
## 2023-07-07 NOTE — Discharge Instructions (Signed)
   [facility]  Discharge Instructions   Carotid Endarterectomy (CEA)  Please refer to the following instructions for your post-procedure care. Your surgeon or physician assistant will discuss any changes with you.  Activity  You are encouraged to walk as much as you can. You can slowly return to normal activities but must avoid strenuous activity and heavy lifting until your doctor tell you it's OK. Avoid activities such as vacuuming or swinging a golf club. You can drive after one week if you are comfortable and you are no longer taking prescription pain medications. It is normal to feel tired for serval weeks after your surgery. It is also normal to have difficulty with sleep habits, eating, and bowel movements after surgery. These will go away with time.  Bathing/Showering  You may shower after you come home. Do not soak in a bathtub, hot tub, or swim until the incision heals completely.  Incision Care  Shower every day. Clean your incision with mild soap and water. Pat the area dry with a clean towel. You do not need a bandage unless otherwise instructed. Do not apply any ointments or creams to your incision. You may have skin glue on your incision. Do not peel it off. It will come off on its own in about one week. Your incision may feel thickened and raised for several weeks after your surgery. This is normal and the skin will soften over time. For Men Only: It's OK to shave around the incision but do not shave the incision itself for 2 weeks. It is common to have numbness under your chin that could last for several months.  Diet  Resume your normal diet. There are no special food restrictions following this procedure. A low fat/low cholesterol diet is recommended for all patients with vascular disease. In order to heal from your surgery, it is CRITICAL to get adequate nutrition. Your body requires vitamins, minerals, and protein. Vegetables are the best  source of vitamins and minerals. Vegetables also provide the perfect balance of protein. Processed food has little nutritional value, so try to avoid this.        Medications  Resume taking all of your medications unless your doctor or physician assistant tells you not to. If your incision is causing pain, you may take over-the- counter pain relievers such as acetaminophen (Tylenol). If you were prescribed a stronger pain medication, please be aware these medications can cause nausea and constipation. Prevent nausea by taking the medication with a snack or meal. Avoid constipation by drinking plenty of fluids and eating foods with a high amount of fiber, such as fruits, vegetables, and grains. Do not take Tylenol if you are taking prescription pain medications.  Follow Up  Our office will schedule a follow up appointment 2-3 weeks following discharge.  Please call us immediately for any of the following conditions  Increased pain, redness, drainage (pus) from your incision site. Fever of 101 degrees or higher. If you should develop stroke (slurred speech, difficulty swallowing, weakness on one side of your body, loss of vision) you should call 911 and go to the nearest emergency room.  Reduce your risk of vascular disease:  Stop smoking. If you would like help call [facility] at 1-800-QUIT-NOW [phone]) 853-8047) or [facility] at [phone]. Manage your cholesterol Maintain a desired weight Control your diabetes Keep your blood pressure down  If you have any questions, please call the office at [phone].

## 2023-07-07 NOTE — Plan of Care (Signed)
  Problem: Clinical Measurements: Goal: Diagnostic test results will improve Outcome: Progressing   Problem: Nutrition: Goal: Adequate nutrition will be maintained Outcome: Progressing   Problem: Pain Managment: Goal: General experience of comfort will improve and/or be controlled Outcome: Progressing   Problem: Skin Integrity: Goal: Risk for impaired skin integrity will decrease Outcome: Progressing   Problem: Clinical Measurements: Goal: Postoperative complications will be avoided or minimized Outcome: Progressing

## 2023-07-07 NOTE — Op Note (Signed)
 Date: July 07, 2023  Preoperative diagnosis: High-grade asymptomatic left internal carotid artery stenosis >80%  Postoperative diagnosis: Same  Procedure: 1.  Ultrasound-guided access right common femoral vein for delivery of TCAR venous sheath 2.  Transcatheter placement of left cervical carotid artery stent including angioplasty with distal embolic protection using flow reversal (left TCAR)  Surgeon: Dr. Young Hensen, MD  Assistant: Naida Austria, PA  Indications: 53 year old male seen with high-grade left internal carotid artery stenosis that is asymptomatic in setting of contralateral right carotid occlusion.  He presents for TCAR with carotid stenting using flow reversal after risk-benefits discussed.  An assistant was needed given the complexity the case and also for wire and sheath exchange.  Findings: Ultrasound-guided access right common femoral vein for delivery of TCAR venous sheath.  Transverse incision above the left clavicle with cutdown on the common carotid artery.  This was accessed after pursestring was placed on the anterior [place] of the left common carotid artery.  Once we were on active flow reversal the lesion in the left ICA was crossed and then balloon angioplasty was performed with a 5 mm x 30 mm angioplasty balloon.  This was then stented with a 9 mm x 7 mm x 40 mm tapered Enroute stent.  Widely patent stent at completion.    Anesthesia: General  Details: Patient was taken to the operating room after informed consent was obtained.  Placed on the operative table in supine position.  General endotracheal anesthesia was induced.  The left neck was then marked for the common carotid artery just above the clavicle.  Bilateral groins and left neck were then prepped and draped in standard sterile fashion.  Antibiotics were given and timeout performed.  Initially ultrasound evaluated the right common femoral vein, it was patent, an image was saved.  This was accessed with  a micro access needle and placed a microwire and micro sheath.  We then used a Bentson wire and placed the TCAR venous sheath in the right common femoral vein and this flushed and aspirated easily. Turned our attention to the left neck where a transverse incision was made 1 fingerbreadth above the left clavicle.  Dissected down through the platysma with Bovie cautery.  The heads of the sternocleidomastoid were divided and we entered the avascular plane and we used wheatlander retractors.  The internal jugular vein was mobilized laterally While preserving the vagus and we dissected out the common carotid artery.  This was controlled with a vessel loop and an umbilical tape.  The patient was given 100 units/kg IV heparin .  ACT was checked to maintain greater than 250.  I then placed a pursestring on the anterior [place] of the left common carotid with 5-0 Prolene.  This was accessed with a micro access needle and placed a microwire and a micro sheath.  We then got a left carotid angiogram to identify the carotid bifurcation.  I then used a J-wire that was placed just below the carotid bifurcation and upsized to the working arterial sheath in the left common carotid artery.  This was secured with multiple 3-0 nylons.  I then connected the filter to the sheath in the right groin where on passive flow reversal.  We then got multiple images to identify the lesion in the proximal left internal carotid artery.  We then went on active flow reversal by clamping the common carotid artery and we had no antegrade flow in the carotid with retrograde flow through the sheath.  The lesion was  then crossed with a wire and then treated with a 5 mm x 30 mm angioplasty balloon.  We then stented this with a 9 mm a 7 mm x 40 mm Enroute stent with the help of my assistant.  We allowed 2 more minutes of active flow reversal.  Widely patent stent at completion.  Final imaging showed widely patent stent with filling of the ICA.  We removed our  wire and came off active clamp.  The arterial sheath in the common carotid was removed tying down the pursestring with good hemostasis.  We then listened with pencil Doppler had good flow.  Protamine  was given for reversal.  The wound was irrigated out and I used Surgicel snow.  The platysma's was closed with 3-0 Vicryl skin closed with 4-0 Monocryl Dermabond.  The sheath in the groin was removed with manual pressure.  Awakened neuro intact and taken to recovery.  Complication: None  Condition: Stable  Young Hensen, MD [facility] [place] Office: [phone]   Young Hensen

## 2023-07-07 NOTE — Progress Notes (Signed)
 Pt's received from PACU, he is alert and oriented, V/S obtained, CCMD notified, CHG bath given, all needs met, no any complains.   07/07/23 1320  Vitals  Temp (!) 97.5 F (36.4 C)  Temp Source Oral  BP (!) 171/75  MAP (mmHg) 102  BP Location Right Arm  BP Method Automatic  Patient Position (if appropriate) Lying  Pulse Rate 83  Pulse Rate Source Monitor  ECG Heart Rate 83  Resp 20  Level of Consciousness  Level of Consciousness Alert  MEWS COLOR  MEWS Score Color Green  Oxygen Therapy  SpO2 100 %  O2 Device Room Air  Art Line  Arterial Line BP 189/87  Arterial Line MAP (mmHg) 121 mmHg  Pain Assessment  Pain Scale 0-10  Pain Score 3  Pain Type Surgical pain  Pain Location Neck  Pain Orientation Left  Pain Descriptors / Indicators Aching  MEWS Score  MEWS Temp 0  MEWS Systolic 0  MEWS Pulse 0  MEWS RR 0  MEWS LOC 0  MEWS Score 0

## 2023-07-08 ENCOUNTER — Other Ambulatory Visit (HOSPITAL_COMMUNITY): Payer: Self-pay

## 2023-07-08 ENCOUNTER — Encounter (HOSPITAL_COMMUNITY): Payer: Self-pay | Admitting: Vascular Surgery

## 2023-07-08 DIAGNOSIS — Z9889 Other specified postprocedural states: Secondary | ICD-10-CM

## 2023-07-08 LAB — CBC
HCT: 44.9 % (ref 39.0–52.0)
Hemoglobin: 14.6 g/dL (ref 13.0–17.0)
MCH: 28.4 pg (ref 26.0–34.0)
MCHC: 32.5 g/dL (ref 30.0–36.0)
MCV: 87.4 fL (ref 80.0–100.0)
Platelets: 215 10*3/uL (ref 150–400)
RBC: 5.14 MIL/uL (ref 4.22–5.81)
RDW: 14.5 % (ref 11.5–15.5)
WBC: 20.3 10*3/uL — ABNORMAL HIGH (ref 4.0–10.5)
nRBC: 0 % (ref 0.0–0.2)

## 2023-07-08 LAB — BASIC METABOLIC PANEL WITH GFR
Anion gap: 12 (ref 5–15)
BUN: 12 mg/dL (ref 6–20)
CO2: 20 mmol/L — ABNORMAL LOW (ref 22–32)
Calcium: 8.7 mg/dL — ABNORMAL LOW (ref 8.9–10.3)
Chloride: 107 mmol/L (ref 98–111)
Creatinine, Ser: 1.02 mg/dL (ref 0.61–1.24)
GFR, Estimated: >60 mL/min (ref >60)
Glucose, Bld: 161 mg/dL — ABNORMAL HIGH (ref 70–99)
Potassium: 4.3 mmol/L (ref 3.5–5.1)
Sodium: 139 mmol/L (ref 135–145)

## 2023-07-08 MED ORDER — OXYCODONE-ACETAMINOPHEN 5-325 MG PO TABS
1.0000 | ORAL_TABLET | Freq: Four times a day (QID) | ORAL | 0 refills | Status: DC | PRN
  Filled 2023-07-08: qty 10, 3d supply, fill #0

## 2023-07-08 NOTE — Plan of Care (Signed)

## 2023-07-08 NOTE — Progress Notes (Signed)
 Patient alert and oriented, verbalized understanding of dc instructions.patient also provided stroke education. Left neck incision and rt groin sites unremarkable

## 2023-07-08 NOTE — Progress Notes (Addendum)
  Progress Note    07/08/2023 7:56 AM 1 Day Post-Op  Subjective:  no neuro events overnight.  Ready to go home this morning   Vitals:   07/08/23 0100 07/08/23 0335  BP: 111/64 138/74  Pulse: 62 64  Resp: 12 20  Temp:    SpO2: 95% 97%   Physical Exam: Lungs:  non labored Incisions:  L neck c/d/I; R groin without hematoma Extremities:  moving all ext well Neurologic: CN grossly intact  CBC    Component Value Date/Time   WBC 20.3 (H) 07/08/2023 0556   RBC 5.14 07/08/2023 0556   HGB 14.6 07/08/2023 0556   HGB 16.0 04/28/2019 1544   HCT 44.9 07/08/2023 0556   HCT 46.8 04/28/2019 1544   PLT 215 07/08/2023 0556   PLT 234 04/28/2019 1544   MCV 87.4 07/08/2023 0556   MCV 84 04/28/2019 1544   MCH 28.4 07/08/2023 0556   MCHC 32.5 07/08/2023 0556   RDW 14.5 07/08/2023 0556   RDW 14.5 04/28/2019 1544   LYMPHSABS 3.8 (H) 11/10/2017 1547   MONOABS 1.3 (H) 04/14/2017 1744   EOSABS 0.3 11/10/2017 1547   BASOSABS 0.1 11/10/2017 1547    BMET    Component Value Date/Time   NA 139 07/08/2023 0556   NA 140 05/09/2020 1423   K 4.3 07/08/2023 0556   CL 107 07/08/2023 0556   CO2 20 (L) 07/08/2023 0556   GLUCOSE 161 (H) 07/08/2023 0556   BUN 12 07/08/2023 0556   BUN 10 05/09/2020 1423   CREATININE 1.02 07/08/2023 0556   CREATININE 1.22 05/31/2015 1219   CALCIUM  8.7 (L) 07/08/2023 0556   GFRNONAA >60 07/08/2023 0556   GFRAA >60 05/30/2019 0314    INR    Component Value Date/Time   INR 1.0 07/07/2023 0624     Intake/Output Summary (Last 24 hours) at 07/08/2023 0756 Last data filed at 07/08/2023 0400 Gross per 24 hour  Intake 1541.87 ml  Output 1850 ml  Net -308.13 ml     Assessment/Plan:  53 y.o. male is s/p L TCAR 1 Day Post-Op   No neuro events overnight L neck incision well appearing; R groin venous access site without hematoma Ok for discharge home today Stressed importance of aspirin , plavix , statin daily Office will arrange carotid duplex in 1  month   Cordie Deters, [place] [facility] [phone] 07/08/2023 7:56 AM  I have seen and evaluated the patient. I agree with the PA note as documented above.  Postop day 1 status post left TCAR for high-grade asymptomatic stenosis in the setting of contralateral carotid occlusion.  All of his incisions look great today including his neck incision.  Grossly neurologically intact.  Plan discharge.  Hemodynamic stable.  Discussed aspirin  statin Plavix  and will arrange follow-up in 1 month with carotid duplex.  Young Hensen, MD [facility] [place] Office: [phone]
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
## 2023-08-06 ENCOUNTER — Other Ambulatory Visit: Payer: Self-pay | Admitting: Vascular Surgery

## 2023-08-06 DIAGNOSIS — I6523 Occlusion and stenosis of bilateral carotid arteries: Secondary | ICD-10-CM

## 2023-08-13 ENCOUNTER — Other Ambulatory Visit (HOSPITAL_COMMUNITY): Payer: Self-pay

## 2023-08-13 ENCOUNTER — Other Ambulatory Visit: Payer: Self-pay

## 2023-08-16 ENCOUNTER — Other Ambulatory Visit (HOSPITAL_COMMUNITY): Payer: Self-pay

## 2023-08-17 ENCOUNTER — Telehealth: Payer: Self-pay | Admitting: Pharmacy Technician

## 2023-08-17 ENCOUNTER — Other Ambulatory Visit (HOSPITAL_COMMUNITY): Payer: Self-pay

## 2023-08-17 NOTE — Telephone Encounter (Signed)
   This is filled at our pharmacy for 4.00 now

## 2023-08-24 ENCOUNTER — Ambulatory Visit: Attending: Vascular Surgery | Admitting: Physician Assistant

## 2023-08-24 ENCOUNTER — Encounter

## 2023-08-24 ENCOUNTER — Ambulatory Visit (HOSPITAL_COMMUNITY)
Admission: RE | Admit: 2023-08-24 | Discharge: 2023-08-24 | Disposition: A | Discharge status: Home / Self Care | Source: Ambulatory Visit | Attending: Vascular Surgery | Admitting: Vascular Surgery

## 2023-08-24 ENCOUNTER — Encounter (HOSPITAL_COMMUNITY)

## 2023-08-24 VITALS — BP 175/90 | HR 47 | Temp 97.9°F | Wt 184.3 lb

## 2023-08-24 DIAGNOSIS — I6523 Occlusion and stenosis of bilateral carotid arteries: Secondary | ICD-10-CM | POA: Diagnosis not present

## 2023-08-24 DIAGNOSIS — I6522 Occlusion and stenosis of left carotid artery: Secondary | ICD-10-CM

## 2023-08-24 DIAGNOSIS — I6521 Occlusion and stenosis of right carotid artery: Secondary | ICD-10-CM

## 2023-08-24 DIAGNOSIS — Z95828 Presence of other vascular implants and grafts: Secondary | ICD-10-CM

## 2023-08-24 NOTE — Progress Notes (Signed)
 POST OPERATIVE OFFICE NOTE    CC:  F/u for surgery  HPI:  This is a 53 y.o. male who is s/p left TCAR on 07/07/23 by Dr. Gretta. This was for asymptomatic high grade left ICA stenosis. He did well post operatively and was discharged home POD#1.   Pt returns today for follow up.  Pt states overall he is doing well. He reports generalized fatigue but he says this has been on going for several years. He denies any visual changes, slurred speech, facial drooping or unilateral upper or lower extremity weakness or numbness. He does endorse some bilateral lower extremity claudication. He is managed by Dr. Court for his PAD. He has no rest pain or tissue loss. He is medically managed on Aspirin , Plavix  and statin.    Allergies  Allergen Reactions   Codeine Other (See Comments)    Migraine  Other Reaction(s): Migraine Headache    Current Outpatient Medications  Medication Sig Dispense Refill   amitriptyline  (ELAVIL ) 10 MG tablet Take 1 tablet (10 mg total) by mouth at bedtime. (Patient not taking: Reported on 07/07/2023) 30 tablet 11   aspirin  EC 81 MG tablet Take 1 tablet (81 mg total) by mouth daily. 30 tablet 0   atorvastatin  (LIPITOR ) 80 MG tablet Take 1 tablet (80 mg total) by mouth at bedtime. 90 tablet 3   carvedilol  (COREG ) 3.125 MG tablet Take 1 tablet (3.125 mg total) by mouth 2 (two) times daily. 60 tablet 11   clopidogrel  (PLAVIX ) 75 MG tablet Take 1 tablet (75 mg total) by mouth daily. (Patient taking differently: Take 75 mg by mouth at bedtime.) 30 tablet 6   Evolocumab  (REPATHA  SURECLICK) 140 MG/ML SOAJ Inject 140 mg into the skin every 14 (fourteen) days. 2 mL 11   oxyCODONE -acetaminophen  (PERCOCET/ROXICET) 5-325 MG tablet Take 1 tablet by mouth every 6 (six) hours as needed for moderate pain (pain score 4-6). 10 tablet 0   topiramate  (TOPAMAX ) 50 MG tablet Take 1 tablet (50 mg total) by mouth at bedtime. (Patient not taking: Reported on 07/07/2023) 90 tablet 3   No current  facility-administered medications for this visit.     ROS:  See HPI  Physical Exam:  Vitals:   08/24/23 1426 08/24/23 1428  BP: (!) 175/84 (!) 175/90  Pulse: (!) 47   Temp: 97.9 F (36.6 C)    General: well appearing, well nourished Cardiac: regular Lungs: non labored Incision:  left neck incision is well healed Extremities:  moving extremities without deficits Neuro: alert and oriented. Speech coherent   Non invasive Vascular lab: VAS US  Carotid: Summary:  Right Carotid: Evidence consistent with a total occlusion of the right ICA.   Left Carotid: Patent left CCA distal to mid ICA stent. There is no evidence of  instent stenosis.   Vertebrals:  Left vertebral artery demonstrates antegrade flow. Right vertebral artery demonstrates no discernable flow.  Subclavians: Normal flow hemodynamics were seen in bilateral subclavian  arteries.    Assessment/Plan:  This is a 53 y.o. male who is s/p: Left TCAR on 07/07/23 by Dr. Gretta. This was for asymptomatic high grade left ICA stenosis. He did well post operatively and was discharged home POD#1. He remains without any neurological symptoms.  His left neck incision is well healed. Duplex shows patent left ICA stent. Known right ICA occlusion.  - Aspirin , Statin, Plavix  - he will follow up with Dr. Court regarding his PAD and claudication symptoms - He will follow up again in 9 months with  carotid duplex  Curry Damme, [facility] [phone]   Clinic MD:  [place]

## 2023-08-26 ENCOUNTER — Encounter: Payer: Self-pay | Admitting: Cardiovascular Disease

## 2023-08-31 ENCOUNTER — Ambulatory Visit: Attending: Cardiovascular Disease | Admitting: Cardiovascular Disease

## 2023-08-31 ENCOUNTER — Encounter: Payer: Self-pay | Admitting: Cardiovascular Disease

## 2023-08-31 VITALS — BP 122/64 | HR 72 | Ht 69.0 in | Wt 184.0 lb

## 2023-08-31 DIAGNOSIS — I739 Peripheral vascular disease, unspecified: Secondary | ICD-10-CM | POA: Diagnosis not present

## 2023-08-31 NOTE — Assessment & Plan Note (Signed)
 Mr. Battaglini returns today for follow-up of PAD.  He does have a history of bilateral iliac stenting 05/16/2013.  He was restented on the left 06/06/2015.  I studied him 11/07/2017 with stenting of his right common iliac artery using a 7 x 8 mm x 29 mm balloon expandable stent through previous stent placed in his left legs were patent.  His last angiogram performed 05/29/2019 revealed high-grade disease in his right common and external iliac arteries both of which were restented.  VBX stent placed in his right common iliac artery and a nitinol self-expanding stent in his external iliac artery.  He has had progressive bilateral lower extremity claudication right greater than left with Doppler studies performed/21/25 revealing a decline in his right ABI from 0.86 down to 0.78.  He has a high-frequency signal in his right extrailiac artery and moderate disease in his left.  I was waiting to intervene until after his carotid stent performed by Dr. Gretta 07/07/2023 before which I did a Myoview  stress test which is low risk and nonischemic.  Patient wishes to proceed with outpatient peripheral angiography.

## 2023-08-31 NOTE — Progress Notes (Signed)
 08/31/2023 [name] [name]   February 17, 1970  [id number]  Primary Physician Patient, No Pcp Per Primary Cardiologist: Dorn JINNY Lesches MD GENI CODY LYNITA ILAH  HPI:  [name] [name] is a 53 y.o.  single Caucasian male, father of one 57 year old son who is referred by Angeline Laura registered ,nurse practitioner, for peripheral vascular evaluation. I last saw him in the 05/24/2023.   He has a history of acute tobacco abuse having smoked 40-60 pack years (quit 1 month ago), treated hypertension and hyperlipidemia. She has never had a heart attack or stroke and denies chest pain or shortness of breath. He saw Dr. Margaretann  years ago and ultimately underwent angiography/ 05/16/13 revealing bilateral iliac disease both of which were stented with balloon expandable and self expanding stents. He enjoyed symptomatic relief for approximately one year and then developed recurrent left calf claudication. Recent Dopplers performed 05/02/15 revealed a left ABI 0.78 with a high-frequency signal in his left external iliac artery. I performed lower extremity angiography on him 06/06/15 revealing a patent left common and external iliac artery stent with a 95% stenosis at the distal edge of the proximal stent which I restented with a 9 mm x 3 cm nitinol self expanding stent. His symptoms of claudication have resolved and his Dopplers have normalized. He has in addition stop smoking by his account. He had a left main stent placed in [facility] August of last year and underwent repeat cardiac catheterization by Dr. 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  He underwent coronary artery bypass grafting x3 by Dr. Army 04/07/2017 with a [name] to the LAD and vein to [name] and 2.  He is done well since.  He has stopped smoking.     I performed peripheral angiography and intervention on him 11/07/2017 with stenting of his right common iliac artery with a 7 mm x 29 mm long balloon expandable  stent.  The previous stent I placed in his left leg were widely patent angiographically.  His follow-up Doppler studies normalized and his claudication has resolved.   He had developed progressive right lower extremity claudication with Doppler studies performed 05/12/2019 revealing a decline in his right ABI to 0.7 with high-frequency signals in his right common and external iliac artery suggesting progression of disease and/or in-stent restenosis.  He does continue to smoke.  Unfortunately, he was out of all of his medicines for 8 months up until recently when he restarted those.  He did have a 2D echo performed 05/09/2019 revealing slightly lower EF than in the past in the 45 to 50% range with an inferoapical [place] motion abnormality.   I performed peripheral angiography on him 05/29/2019 revealing high-grade disease in his right common extra iliac artery both of which were restented.  He had a VBX stent placed in his right common iliac artery and a nitinol self-expanding stent in his external iliac artery.  His right ABI increased from 0.7 up to 0.98.  His claudication has resolved.   He has developed progressive right lower extremity discomfort consistent with claudication.  He is also seeing a neurologist and orthopedic surgeon/physiatrist.  He denies chest pain or shortness of breath.  He apparently stopped smoking a month ago.  He did have lower extremity actual Doppler studies performed 05/06/22 revealing a decline in his right ABI to 0.86 high-frequency signal in his right iliac artery.  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  I did refer him to Dr. Medford Gaskins who performed TCAR of his left internal carotid artery 07/07/2023.  I did perform Myoview  stress testing on him preoperatively 06/03/2023 which is low risk and nonischemic.  The patient still has lifestyle-limiting claudication right greater than left and wishes to  proceed with outpatient peripheral angiography.   Current Meds  Medication Sig   aspirin  EC 81 MG tablet Take 1 tablet (81 mg total) by mouth daily.   atorvastatin  (LIPITOR ) 80 MG tablet Take 1 tablet (80 mg total) by mouth at bedtime.   carvedilol  (COREG ) 3.125 MG tablet Take 1 tablet (3.125 mg total) by mouth 2 (two) times daily.   clopidogrel  (PLAVIX ) 75 MG tablet Take 1 tablet (75 mg total) by mouth daily.   Evolocumab  (REPATHA  SURECLICK) 140 MG/ML SOAJ Inject 140 mg into the skin every 14 (fourteen) days.     Allergies  Allergen Reactions   Codeine Other (See Comments)    Migraine  Other Reaction(s): Migraine Headache    Social History   Socioeconomic History   Marital status: Divorced    Spouse name: Not on file   Number of children: Not on file   Years of education: Not on file   Highest education level: Not on file  Occupational History   Occupation: [employer] doctor  Tobacco Use   Smoking status: Former    Average packs/day: 1 pack/day for 30.0 years (30.0 ttl pk-yrs)    Types: Cigarettes    Start date: 06/06/1985    Quit date: 06/07/2015    Years since quitting: 8.2   Smokeless tobacco: Former    Types: Snuff   Tobacco comments:    used snuff in my teens  Vaping Use   Vaping status: Former  Substance and Sexual Activity   Alcohol use: Not Currently    Comment: 06/06/2015 might have 1 beer/month   Drug use: No   Sexual activity: Not Currently  Other Topics Concern   Not on file  Social History Narrative   Not on file   Social Drivers of Health   Financial Resource Strain: High Risk (12/12/2019)   Overall Financial Resource Strain (CARDIA)    Difficulty of Paying Living Expenses: Very hard  Food Insecurity: No Food Insecurity (07/07/2023)   Hunger Vital Sign    Worried About Running Out of Food in the Last Year: Never true    Ran Out of Food in the Last Year: Never true  Transportation Needs: No Transportation Needs (07/07/2023)   PRAPARE -  Administrator, Civil Service (Medical): No    Lack of Transportation (Non-Medical): No  Physical Activity: Not on file  Stress: Not on file  Social Connections: Not on file  Intimate Partner Violence: Unknown (07/07/2023)   Humiliation, Afraid, Rape, and Kick questionnaire    Fear of Current or Ex-Partner: Not on file    Emotionally Abused: Patient declined    Physically Abused: Not on file    Sexually Abused: Not on file     Review of Systems: General: negative for chills, fever, night sweats or weight changes.  Cardiovascular: negative for chest pain, dyspnea on exertion, edema, orthopnea, palpitations, paroxysmal nocturnal dyspnea or shortness of breath Dermatological: negative for rash Respiratory: negative for cough or wheezing Urologic: negative for hematuria Abdominal: negative for nausea, vomiting, diarrhea, bright red blood per rectum, melena, or hematemesis Neurologic: negative for visual changes, syncope, or dizziness All other systems reviewed and are otherwise negative except as  noted above.    Blood pressure 122/64, pulse 72, height 5' 9 (1.753 m), weight 184 lb (83.5 kg), SpO2 95%.  General appearance: alert and no distress Neck: no adenopathy, no carotid bruit, no JVD, supple, symmetrical, trachea midline, and thyroid  not enlarged, symmetric, no tenderness/mass/nodules Lungs: clear to auscultation bilaterally Heart: regular rate and rhythm, S1, S2 normal, no murmur, click, rub or gallop Extremities: extremities normal, atraumatic, no cyanosis or edema Pulses: Decreased pedal pulses Skin: Skin color, texture, turgor normal. No rashes or lesions Neurologic: Grossly normal  EKG not performed today      ASSESSMENT AND PLAN:   PAD (peripheral artery disease) (HCC) Mr. Gurry returns today for follow-up of PAD.  He does have a history of bilateral iliac stenting 05/16/2013.  He was restented on the left 06/06/2015.  I studied him 11/07/2017 with stenting of  his right common iliac artery using a 7 x 8 mm x 29 mm balloon expandable stent through previous stent placed in his left legs were patent.  His last angiogram performed 05/29/2019 revealed high-grade disease in his right common and external iliac arteries both of which were restented.  VBX stent placed in his right common iliac artery and a nitinol self-expanding stent in his external iliac artery.  He has had progressive bilateral lower extremity claudication right greater than left with Doppler studies performed/21/25 revealing a decline in his right ABI from 0.86 down to 0.78.  He has a high-frequency signal in his right extrailiac artery and moderate disease in his left.  I was waiting to intervene until after his carotid stent performed by Dr. Gretta 07/07/2023 before which I did a Myoview  stress test which is low risk and nonischemic.  Patient wishes to proceed with outpatient peripheral angiography.     Dorn DOROTHA Lesches MD FACP,FACC,FAHA, [facility] 08/31/2023 2:56 PM

## 2023-08-31 NOTE — Patient Instructions (Signed)
 Medication Instructions:  Your physician recommends that you continue on your current medications as directed. Please refer to the Current Medication list given to you today.  *If you need a refill on your cardiac medications before your next appointment, please call your pharmacy*  Lab Work: Your physician recommends that you have labs drawn today: BMET & CBC  If you have labs (blood work) drawn today and your tests are completely normal, you will receive your results only by: MyChart Message (if you have MyChart) OR A paper copy in the mail If you have any lab test that is abnormal or we need to change your treatment, we will call you to review the results.  Testing/Procedures: Your physician has requested that you have an Aorta/Iliac Duplex. This will take place at [address] 4th floor  No food after 11PM the night before.  Water is OK. (Don't drink liquids if you have been instructed not to for ANOTHER test) Avoid foods that produce bowel gas, for 24 hours prior to exam (see below). No breakfast, no chewing gum, no smoking or carbonated beverages. Patient may take morning medications with water. Come in for test at least 15 minutes early to register. **To be done 1-2 weeks after your procedure (9/11)**  Please note: We ask at that you not bring children with you during ultrasound (echo/ vascular) testing. Due to room size and safety concerns, children are not allowed in the ultrasound rooms during exams. Our front office staff cannot provide observation of children in our lobby area while testing is being conducted. An adult accompanying a patient to their appointment will only be allowed in the ultrasound room at the discretion of the ultrasound technician under special circumstances. We apologize for any inconvenience.  Your physician has requested that you have an ankle brachial index (ABI). During this test an ultrasound and blood pressure cuff are used to evaluate the  arteries that supply the arms and legs with blood. Allow thirty minutes for this exam. There are no restrictions or special instructions. This will take place at [address] 4th [address]   **To be done 1-2 weeks after your procedure (9/11)**   Please note: We ask at that you not bring children with you during ultrasound (echo/ vascular) testing. Due to room size and safety concerns, children are not allowed in the ultrasound rooms during exams. Our front office staff cannot provide observation of children in our lobby area while testing is being conducted. An adult accompanying a patient to their appointment will only be allowed in the ultrasound room at the discretion of the ultrasound technician under special circumstances. We apologize for any inconvenience.   Follow-Up: At [facility], you and your health needs are our priority.  As part of our continuing mission to provide you with exceptional heart care, our providers are all part of one team.  This team includes your primary Cardiologist (physician) and Advanced Practice Providers or APPs (Physician Assistants and Nurse Practitioners) who all work together to provide you with the care you need, when you need it.  Your next appointment:   2-3 week(s) after your procedure (9/11)  Provider:   Dorn Lesches, MD    We recommend signing up for the patient portal called MyChart.  Sign up information is provided on this After Visit Summary.  MyChart is used to connect with patients for Virtual Visits (Telemedicine).  Patients are able to view lab/test results, encounter notes, upcoming appointments, etc.  Non-urgent messages can  be sent to your provider as well.   To learn more about what you can do with MyChart, go to [email or web address].   Other Instructions       Cardiac/Peripheral Catheterization   You are scheduled for a Peripheral Angiogram on Thursday, September 11 with Dr. Dorn Lesches.  1. Please arrive at  the [facility] (Main Entrance A) at [facility]: [address] [place], [place] [address] at 7:30 AM (This time is 2 hour(s) before your procedure to ensure your preparation).   Free valet parking service is available. You will check in at ADMITTING. The support person will be asked to wait in the waiting room.  It is OK to have someone drop you off and come back when you are ready to be discharged.        Special note: Every effort is made to have your procedure done on time. Please understand that emergencies sometimes delay scheduled procedures.  2. Diet: No solid foods after midnight. You may have clear liquids until you arrive at the hospital.  List of approved liquids water, clear juice, clear tea, black coffee, fruit juices, non-citric and without pulp, carbonated beverages, Gatorade, Kool -Aid, plain Jello-O and plain ice popsicles.  3. Hydration: You need to be well hydrated before your procedure time. You may drink approved liquids (see below) until you arrive at the hospital. On the way to the hospital, please drink a 16-oz (1 plastic bottle) of water.   List of approved liquids water, clear juice, clear tea, black coffee, fruit juices, non-citric and without pulp, carbonated beverages, Gatorade, Kool -Aid, plain Jello-O and plain ice popsicles.  4. Labs: You will need to have blood drawn today (8/12).  5. Medication instructions in preparation for your procedure:  On the morning of your procedure, take Aspirin  81 mg and Plavix /Clopidogrel  and any morning medicines NOT listed above.  You may use sips of water.  6. Plan to go home the same day, you will only stay overnight if medically necessary. 7. You MUST have a responsible adult to drive you home. 8. An adult MUST be with you the first 24 hours after you arrive home. 9. Bring a current list of your medications, and the last time and date medication taken. 10. Bring ID and current insurance cards. 11.Please wear  clothes that are easy to get on and off and wear slip-on shoes.  Thank you for allowing us  to care for you!   -- [facility]

## 2023-08-31 NOTE — H&P (View-Only) (Signed)
 08/31/2023 [name] [name]   February 17, 1970  [id number]  Primary Physician Patient, No Pcp Per Primary Cardiologist: Dorn JINNY Lesches MD GENI CODY LYNITA ILAH  HPI:  [name] [name] is a 53 y.o.  single Caucasian male, father of one 57 year old son who is referred by Angeline Laura registered ,nurse practitioner, for peripheral vascular evaluation. I last saw him in the 05/24/2023.   He has a history of acute tobacco abuse having smoked 40-60 pack years (quit 1 month ago), treated hypertension and hyperlipidemia. She has never had a heart attack or stroke and denies chest pain or shortness of breath. He saw Dr. Margaretann  years ago and ultimately underwent angiography/ 05/16/13 revealing bilateral iliac disease both of which were stented with balloon expandable and self expanding stents. He enjoyed symptomatic relief for approximately one year and then developed recurrent left calf claudication. Recent Dopplers performed 05/02/15 revealed a left ABI 0.78 with a high-frequency signal in his left external iliac artery. I performed lower extremity angiography on him 06/06/15 revealing a patent left common and external iliac artery stent with a 95% stenosis at the distal edge of the proximal stent which I restented with a 9 mm x 3 cm nitinol self expanding stent. His symptoms of claudication have resolved and his Dopplers have normalized. He has in addition stop smoking by his account. He had a left main stent placed in [facility] August of last year and underwent repeat cardiac catheterization by Dr. 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  He underwent coronary artery bypass grafting x3 by Dr. Army 04/07/2017 with a [name] to the LAD and vein to [name] and 2.  He is done well since.  He has stopped smoking.     I performed peripheral angiography and intervention on him 11/07/2017 with stenting of his right common iliac artery with a 7 mm x 29 mm long balloon expandable  stent.  The previous stent I placed in his left leg were widely patent angiographically.  His follow-up Doppler studies normalized and his claudication has resolved.   He had developed progressive right lower extremity claudication with Doppler studies performed 05/12/2019 revealing a decline in his right ABI to 0.7 with high-frequency signals in his right common and external iliac artery suggesting progression of disease and/or in-stent restenosis.  He does continue to smoke.  Unfortunately, he was out of all of his medicines for 8 months up until recently when he restarted those.  He did have a 2D echo performed 05/09/2019 revealing slightly lower EF than in the past in the 45 to 50% range with an inferoapical [place] motion abnormality.   I performed peripheral angiography on him 05/29/2019 revealing high-grade disease in his right common extra iliac artery both of which were restented.  He had a VBX stent placed in his right common iliac artery and a nitinol self-expanding stent in his external iliac artery.  His right ABI increased from 0.7 up to 0.98.  His claudication has resolved.   He has developed progressive right lower extremity discomfort consistent with claudication.  He is also seeing a neurologist and orthopedic surgeon/physiatrist.  He denies chest pain or shortness of breath.  He apparently stopped smoking a month ago.  He did have lower extremity actual Doppler studies performed 05/06/22 revealing a decline in his right ABI to 0.86 high-frequency signal in his right iliac artery.  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  I did refer him to Dr. Medford Gaskins who performed TCAR of his left internal carotid artery 07/07/2023.  I did perform Myoview  stress testing on him preoperatively 06/03/2023 which is low risk and nonischemic.  The patient still has lifestyle-limiting claudication right greater than left and wishes to  proceed with outpatient peripheral angiography.   Current Meds  Medication Sig   aspirin  EC 81 MG tablet Take 1 tablet (81 mg total) by mouth daily.   atorvastatin  (LIPITOR ) 80 MG tablet Take 1 tablet (80 mg total) by mouth at bedtime.   carvedilol  (COREG ) 3.125 MG tablet Take 1 tablet (3.125 mg total) by mouth 2 (two) times daily.   clopidogrel  (PLAVIX ) 75 MG tablet Take 1 tablet (75 mg total) by mouth daily.   Evolocumab  (REPATHA  SURECLICK) 140 MG/ML SOAJ Inject 140 mg into the skin every 14 (fourteen) days.     Allergies  Allergen Reactions   Codeine Other (See Comments)    Migraine  Other Reaction(s): Migraine Headache    Social History   Socioeconomic History   Marital status: Divorced    Spouse name: Not on file   Number of children: Not on file   Years of education: Not on file   Highest education level: Not on file  Occupational History   Occupation: [employer] doctor  Tobacco Use   Smoking status: Former    Average packs/day: 1 pack/day for 30.0 years (30.0 ttl pk-yrs)    Types: Cigarettes    Start date: 06/06/1985    Quit date: 06/07/2015    Years since quitting: 8.2   Smokeless tobacco: Former    Types: Snuff   Tobacco comments:    used snuff in my teens  Vaping Use   Vaping status: Former  Substance and Sexual Activity   Alcohol use: Not Currently    Comment: 06/06/2015 might have 1 beer/month   Drug use: No   Sexual activity: Not Currently  Other Topics Concern   Not on file  Social History Narrative   Not on file   Social Drivers of Health   Financial Resource Strain: High Risk (12/12/2019)   Overall Financial Resource Strain (CARDIA)    Difficulty of Paying Living Expenses: Very hard  Food Insecurity: No Food Insecurity (07/07/2023)   Hunger Vital Sign    Worried About Running Out of Food in the Last Year: Never true    Ran Out of Food in the Last Year: Never true  Transportation Needs: No Transportation Needs (07/07/2023)   PRAPARE -  Administrator, Civil Service (Medical): No    Lack of Transportation (Non-Medical): No  Physical Activity: Not on file  Stress: Not on file  Social Connections: Not on file  Intimate Partner Violence: Unknown (07/07/2023)   Humiliation, Afraid, Rape, and Kick questionnaire    Fear of Current or Ex-Partner: Not on file    Emotionally Abused: Patient declined    Physically Abused: Not on file    Sexually Abused: Not on file     Review of Systems: General: negative for chills, fever, night sweats or weight changes.  Cardiovascular: negative for chest pain, dyspnea on exertion, edema, orthopnea, palpitations, paroxysmal nocturnal dyspnea or shortness of breath Dermatological: negative for rash Respiratory: negative for cough or wheezing Urologic: negative for hematuria Abdominal: negative for nausea, vomiting, diarrhea, bright red blood per rectum, melena, or hematemesis Neurologic: negative for visual changes, syncope, or dizziness All other systems reviewed and are otherwise negative except as  noted above.    Blood pressure 122/64, pulse 72, height 5' 9 (1.753 m), weight 184 lb (83.5 kg), SpO2 95%.  General appearance: alert and no distress Neck: no adenopathy, no carotid bruit, no JVD, supple, symmetrical, trachea midline, and thyroid  not enlarged, symmetric, no tenderness/mass/nodules Lungs: clear to auscultation bilaterally Heart: regular rate and rhythm, S1, S2 normal, no murmur, click, rub or gallop Extremities: extremities normal, atraumatic, no cyanosis or edema Pulses: Decreased pedal pulses Skin: Skin color, texture, turgor normal. No rashes or lesions Neurologic: Grossly normal  EKG not performed today      ASSESSMENT AND PLAN:   PAD (peripheral artery disease) (HCC) Mr. Gurry returns today for follow-up of PAD.  He does have a history of bilateral iliac stenting 05/16/2013.  He was restented on the left 06/06/2015.  I studied him 11/07/2017 with stenting of  his right common iliac artery using a 7 x 8 mm x 29 mm balloon expandable stent through previous stent placed in his left legs were patent.  His last angiogram performed 05/29/2019 revealed high-grade disease in his right common and external iliac arteries both of which were restented.  VBX stent placed in his right common iliac artery and a nitinol self-expanding stent in his external iliac artery.  He has had progressive bilateral lower extremity claudication right greater than left with Doppler studies performed/21/25 revealing a decline in his right ABI from 0.86 down to 0.78.  He has a high-frequency signal in his right extrailiac artery and moderate disease in his left.  I was waiting to intervene until after his carotid stent performed by Dr. Gretta 07/07/2023 before which I did a Myoview  stress test which is low risk and nonischemic.  Patient wishes to proceed with outpatient peripheral angiography.     Dorn DOROTHA Lesches MD FACP,FACC,FAHA, [facility] 08/31/2023 2:56 PM

## 2023-09-01 ENCOUNTER — Ambulatory Visit: Payer: Self-pay | Admitting: Cardiovascular Disease

## 2023-09-01 ENCOUNTER — Encounter: Payer: Self-pay | Admitting: Neurology

## 2023-09-01 LAB — LIPID PANEL
Chol/HDL Ratio: 7.6 ratio — ABNORMAL HIGH (ref 0.0–5.0)
Cholesterol, Total: 206 mg/dL — ABNORMAL HIGH (ref 100–199)
HDL: 27 mg/dL — ABNORMAL LOW (ref >39)
LDL Chol Calc (NIH): 138 mg/dL — ABNORMAL HIGH (ref 0–99)
Triglycerides: 225 mg/dL — ABNORMAL HIGH (ref 0–149)
VLDL Cholesterol Cal: 41 mg/dL — ABNORMAL HIGH (ref 5–40)

## 2023-09-01 LAB — CBC WITH DIFFERENTIAL/PLATELET
Basophils Absolute: 0.1 x10E3/uL (ref 0.0–0.2)
Basos: 1 %
EOS (ABSOLUTE): 0.3 x10E3/uL (ref 0.0–0.4)
Eos: 3 %
Hematocrit: 46 % (ref 37.5–51.0)
Hemoglobin: 14.9 g/dL (ref 13.0–17.7)
Immature Grans (Abs): 0 x10E3/uL (ref 0.0–0.1)
Immature Granulocytes: 0 %
Lymphocytes Absolute: 3.3 x10E3/uL — ABNORMAL HIGH (ref 0.7–3.1)
Lymphs: 37 %
MCH: 28.7 pg (ref 26.6–33.0)
MCHC: 32.4 g/dL (ref 31.5–35.7)
MCV: 89 fL (ref 79–97)
Monocytes Absolute: 0.8 x10E3/uL (ref 0.1–0.9)
Monocytes: 9 %
Neutrophils Absolute: 4.5 x10E3/uL (ref 1.4–7.0)
Neutrophils: 50 %
Platelets: 228 x10E3/uL (ref 150–450)
RBC: 5.19 x10E6/uL (ref 4.14–5.80)
RDW: 14.3 % (ref 11.6–15.4)
WBC: 8.9 x10E3/uL (ref 3.4–10.8)

## 2023-09-01 LAB — COMPREHENSIVE METABOLIC PANEL WITH GFR
ALT: 13 IU/L (ref 0–44)
AST: 55 IU/L — ABNORMAL HIGH (ref 0–40)
Albumin: 4.3 g/dL (ref 3.8–4.9)
Alkaline Phosphatase: 113 IU/L (ref 44–121)
BUN/Creatinine Ratio: 9 (ref 9–20)
BUN: 12 mg/dL (ref 6–24)
Bilirubin Total: 0.4 mg/dL (ref 0.0–1.2)
CO2: 23 mmol/L (ref 20–29)
Calcium: 8.9 mg/dL (ref 8.7–10.2)
Chloride: 103 mmol/L (ref 96–106)
Creatinine, Ser: 1.28 mg/dL — ABNORMAL HIGH (ref 0.76–1.27)
Globulin, Total: 2.8 g/dL (ref 1.5–4.5)
Glucose: 82 mg/dL (ref 70–99)
Potassium: 4.3 mmol/L (ref 3.5–5.2)
Sodium: 139 mmol/L (ref 134–144)
Total Protein: 7.1 g/dL (ref 6.0–8.5)
eGFR: 67 mL/min/1.73 (ref >59)

## 2023-09-01 LAB — LDL CHOLESTEROL, DIRECT: LDL Direct: 140 mg/dL — ABNORMAL HIGH (ref 0–99)

## 2023-09-06 ENCOUNTER — Telehealth: Payer: Self-pay | Admitting: Neurology

## 2023-09-06 NOTE — Telephone Encounter (Signed)
 appointment cx due to conflict, pt having surgery

## 2023-09-06 NOTE — Telephone Encounter (Signed)
 Called to r/s but there was no answer. LVM for pt to call back and r/s. Pt can be scheduled next available and put on the wait list per nurse.

## 2023-09-07 NOTE — Telephone Encounter (Signed)
 Called pt 2nd attempt. Operator came on stating the call can not be completed as dialed.

## 2023-09-08 NOTE — Telephone Encounter (Signed)
 Called pt for the third time and operator continues to say that the call can not be completed.

## 2023-09-13 ENCOUNTER — Other Ambulatory Visit (HOSPITAL_COMMUNITY): Payer: Self-pay

## 2023-09-13 ENCOUNTER — Ambulatory Visit: Admitting: Student

## 2023-09-13 ENCOUNTER — Encounter: Payer: Self-pay | Admitting: Student

## 2023-09-13 ENCOUNTER — Other Ambulatory Visit: Payer: Self-pay

## 2023-09-13 VITALS — BP 121/80 | HR 75 | Temp 98.7°F | Ht 69.0 in | Wt 179.6 lb

## 2023-09-13 DIAGNOSIS — L249 Irritant contact dermatitis, unspecified cause: Secondary | ICD-10-CM | POA: Insufficient documentation

## 2023-09-13 DIAGNOSIS — L245 Irritant contact dermatitis due to other chemical products: Secondary | ICD-10-CM

## 2023-09-13 MED ORDER — TRIAMCINOLONE ACETONIDE 0.025 % EX OINT
1.0000 | TOPICAL_OINTMENT | Freq: Two times a day (BID) | CUTANEOUS | 0 refills | Status: AC
  Filled 2023-09-13: qty 30, 15d supply, fill #0
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
## 2023-09-13 NOTE — Assessment & Plan Note (Addendum)
 [name] is here for chief complaint of bilateral hand swelling, itching, rash.  It all started directly after working on his car on Saturday, 2 days ago, when he spilled brake fluid onto his ungloved hands.  Thereafter his symptoms arose.  Over these next 2 days, the symptoms have improved, but he wanted to have them evaluated, noting that he is becoming more health-conscious as he has a very significant history of vascular disease (PAD, CAD, occlusion of left carotid, CABG).  His presentation is consistent with irritant contact dermatitis.  The skin is intact, I have no concern of an infection, rheumatologic disease, burn, or medication side effect.  The overall trajectory is improvement.  Self treatment with Benadryl has helped.  I will also provide triamcinolone  steroid cream for him to use twice daily and will recommend Vaseline as an emollient.  He will return to the clinic as needed for this issue.

## 2023-09-14 NOTE — Progress Notes (Signed)
 Internal Medicine Clinic Attending  Case discussed with the resident at the time of the visit.  We reviewed the resident's history and exam and pertinent patient test results.  I agree with the assessment, diagnosis, and plan of care documented in the resident's note.

## 2023-09-21 ENCOUNTER — Other Ambulatory Visit: Payer: Self-pay | Admitting: Cardiovascular Disease

## 2023-09-21 DIAGNOSIS — I739 Peripheral vascular disease, unspecified: Secondary | ICD-10-CM

## 2023-09-28 ENCOUNTER — Ambulatory Visit: Admitting: Cardiovascular Disease

## 2023-09-30 ENCOUNTER — Encounter (HOSPITAL_COMMUNITY): Payer: Self-pay | Admitting: Cardiovascular Disease

## 2023-09-30 ENCOUNTER — Ambulatory Visit: Admitting: Neurology

## 2023-09-30 ENCOUNTER — Ambulatory Visit (HOSPITAL_COMMUNITY)
Admission: RE | Disposition: A | Discharge status: Home / Self Care | Payer: Self-pay | Source: Home / Self Care | Attending: Cardiovascular Disease

## 2023-09-30 ENCOUNTER — Ambulatory Visit (HOSPITAL_COMMUNITY)
Admission: RE | Admit: 2023-09-30 | Discharge: 2023-10-01 | Disposition: A | Discharge status: Home / Self Care | Attending: Cardiovascular Disease | Admitting: Cardiovascular Disease

## 2023-09-30 ENCOUNTER — Other Ambulatory Visit: Payer: Self-pay

## 2023-09-30 DIAGNOSIS — E785 Hyperlipidemia, unspecified: Secondary | ICD-10-CM | POA: Insufficient documentation

## 2023-09-30 DIAGNOSIS — I6523 Occlusion and stenosis of bilateral carotid arteries: Secondary | ICD-10-CM | POA: Diagnosis not present

## 2023-09-30 DIAGNOSIS — Z79899 Other long term (current) drug therapy: Secondary | ICD-10-CM | POA: Insufficient documentation

## 2023-09-30 DIAGNOSIS — Z720 Tobacco use: Secondary | ICD-10-CM | POA: Diagnosis not present

## 2023-09-30 DIAGNOSIS — Z951 Presence of aortocoronary bypass graft: Secondary | ICD-10-CM | POA: Diagnosis not present

## 2023-09-30 DIAGNOSIS — I70212 Atherosclerosis of native arteries of extremities with intermittent claudication, left leg: Secondary | ICD-10-CM | POA: Diagnosis not present

## 2023-09-30 DIAGNOSIS — Z7982 Long term (current) use of aspirin: Secondary | ICD-10-CM | POA: Insufficient documentation

## 2023-09-30 DIAGNOSIS — Z955 Presence of coronary angioplasty implant and graft: Secondary | ICD-10-CM | POA: Diagnosis not present

## 2023-09-30 DIAGNOSIS — I70211 Atherosclerosis of native arteries of extremities with intermittent claudication, right leg: Secondary | ICD-10-CM | POA: Diagnosis not present

## 2023-09-30 DIAGNOSIS — I10 Essential (primary) hypertension: Secondary | ICD-10-CM | POA: Insufficient documentation

## 2023-09-30 DIAGNOSIS — I739 Peripheral vascular disease, unspecified: Secondary | ICD-10-CM | POA: Diagnosis present

## 2023-09-30 DIAGNOSIS — R7989 Other specified abnormal findings of blood chemistry: Secondary | ICD-10-CM

## 2023-09-30 DIAGNOSIS — Z7902 Long term (current) use of antithrombotics/antiplatelets: Secondary | ICD-10-CM | POA: Insufficient documentation

## 2023-09-30 DIAGNOSIS — I25810 Atherosclerosis of coronary artery bypass graft(s) without angina pectoris: Secondary | ICD-10-CM

## 2023-09-30 DIAGNOSIS — I2510 Atherosclerotic heart disease of native coronary artery without angina pectoris: Secondary | ICD-10-CM | POA: Diagnosis not present

## 2023-09-30 HISTORY — PX: LOWER EXTREMITY INTERVENTION: CATH118252

## 2023-09-30 HISTORY — PX: LOWER EXTREMITY ANGIOGRAPHY: CATH118251

## 2023-09-30 SURGERY — LOWER EXTREMITY ANGIOGRAPHY
Anesthesia: LOCAL

## 2023-09-30 MED ORDER — FENTANYL CITRATE (PF) 100 MCG/2ML IJ SOLN
25.0000 ug | Freq: Once | INTRAMUSCULAR | Status: AC
  Administered 2023-09-30: 25 ug via INTRAVENOUS

## 2023-09-30 MED ORDER — HEPARIN (PORCINE) IN NACL 1000-0.9 UT/500ML-% IV SOLN
INTRAVENOUS | Status: DC | PRN
  Administered 2023-09-30: 1000 mL via SURGICAL_CAVITY

## 2023-09-30 MED ORDER — CLOPIDOGREL BISULFATE 75 MG PO TABS: 75.0000 mg | ORAL_TABLET | Freq: Every day | ORAL | Status: DC

## 2023-09-30 MED ORDER — MIDAZOLAM HCL 2 MG/2ML IJ SOLN
INTRAMUSCULAR | Status: DC | PRN
  Administered 2023-09-30 (×2): 1 mg via INTRAVENOUS

## 2023-09-30 MED ORDER — ONDANSETRON HCL 4 MG/2ML IJ SOLN
4.0000 mg | Freq: Four times a day (QID) | INTRAMUSCULAR | Status: DC | PRN
  Administered 2023-09-30: 4 mg via INTRAVENOUS
  Filled 2023-09-30: qty 2

## 2023-09-30 MED ORDER — LABETALOL HCL 5 MG/ML IV SOLN: 10.0000 mg | INTRAVENOUS | Status: DC | PRN

## 2023-09-30 MED ORDER — SODIUM CHLORIDE 0.9 % IV SOLN: 250.0000 mL | INTRAVENOUS | Status: DC | PRN

## 2023-09-30 MED ORDER — CLOPIDOGREL BISULFATE 300 MG PO TABS
300.0000 mg | ORAL_TABLET | Freq: Once | ORAL | Status: AC
Start: 2023-09-30 — End: 2023-09-30
  Administered 2023-09-30: 300 mg via ORAL

## 2023-09-30 MED ORDER — MIDAZOLAM HCL 2 MG/2ML IJ SOLN
INTRAMUSCULAR | Status: AC
  Filled 2023-09-30: qty 2

## 2023-09-30 MED ORDER — HEPARIN SODIUM (PORCINE) 1000 UNIT/ML IJ SOLN
INTRAMUSCULAR | Status: DC | PRN
  Administered 2023-09-30: 9000 [IU] via INTRAVENOUS

## 2023-09-30 MED ORDER — ATORVASTATIN CALCIUM 80 MG PO TABS
80.0000 mg | ORAL_TABLET | Freq: Every day | ORAL | Status: DC
  Administered 2023-09-30: 80 mg via ORAL
  Filled 2023-09-30: qty 1

## 2023-09-30 MED ORDER — SODIUM CHLORIDE 0.9% FLUSH: 3.0000 mL | INTRAVENOUS | Status: DC | PRN

## 2023-09-30 MED ORDER — FENTANYL CITRATE (PF) 100 MCG/2ML IJ SOLN
INTRAMUSCULAR | Status: DC | PRN
  Administered 2023-09-30 (×2): 25 ug via INTRAVENOUS

## 2023-09-30 MED ORDER — FENTANYL CITRATE (PF) 100 MCG/2ML IJ SOLN
INTRAMUSCULAR | Status: AC
  Filled 2023-09-30: qty 2

## 2023-09-30 MED ORDER — CLOPIDOGREL BISULFATE 75 MG PO TABS
75.0000 mg | ORAL_TABLET | Freq: Once | ORAL | Status: AC
Start: 2023-09-30 — End: 2023-09-30
  Administered 2023-09-30: 75 mg via ORAL

## 2023-09-30 MED ORDER — SODIUM CHLORIDE 0.9% FLUSH: 3.0000 mL | Freq: Two times a day (BID) | INTRAVENOUS | Status: DC

## 2023-09-30 MED ORDER — FREE WATER: 500.0000 mL | Freq: Once | Status: DC

## 2023-09-30 MED ORDER — AMITRIPTYLINE HCL 10 MG PO TABS: 10.0000 mg | ORAL_TABLET | Freq: Every day | ORAL | Status: DC

## 2023-09-30 MED ORDER — ASPIRIN 81 MG PO TBEC
81.0000 mg | DELAYED_RELEASE_TABLET | Freq: Every day | ORAL | Status: DC
  Administered 2023-10-01: 81 mg via ORAL
  Filled 2023-09-30: qty 1

## 2023-09-30 MED ORDER — CARVEDILOL 3.125 MG PO TABS
3.1250 mg | ORAL_TABLET | Freq: Two times a day (BID) | ORAL | Status: DC
  Administered 2023-09-30 – 2023-10-01 (×2): 3.125 mg via ORAL
  Filled 2023-09-30 (×2): qty 1

## 2023-09-30 MED ORDER — HYDRALAZINE HCL 20 MG/ML IJ SOLN
5.0000 mg | INTRAMUSCULAR | Status: DC | PRN
  Administered 2023-09-30: 5 mg via INTRAVENOUS

## 2023-09-30 MED ORDER — LIDOCAINE HCL (PF) 1 % IJ SOLN
INTRAMUSCULAR | Status: DC | PRN
  Administered 2023-09-30: 20 mL

## 2023-09-30 MED ORDER — IODIXANOL 320 MG/ML IV SOLN
INTRAVENOUS | Status: DC | PRN
  Administered 2023-09-30: 55 mL via INTRA_ARTERIAL

## 2023-09-30 MED ORDER — SODIUM CHLORIDE 0.9% FLUSH
3.0000 mL | Freq: Two times a day (BID) | INTRAVENOUS | Status: DC
Start: 2023-09-30 — End: 2023-10-01
  Administered 2023-09-30 – 2023-10-01 (×2): 3 mL via INTRAVENOUS

## 2023-09-30 MED ORDER — SODIUM CHLORIDE 0.9 % IV SOLN: INTRAVENOUS | Status: AC

## 2023-09-30 MED ORDER — ASPIRIN 81 MG PO TBEC: 81.0000 mg | DELAYED_RELEASE_TABLET | Freq: Every day | ORAL | Status: DC

## 2023-09-30 MED ORDER — LIDOCAINE HCL (PF) 1 % IJ SOLN
INTRAMUSCULAR | Status: AC
  Filled 2023-09-30: qty 30

## 2023-09-30 MED ORDER — HEPARIN SODIUM (PORCINE) 1000 UNIT/ML IJ SOLN
INTRAMUSCULAR | Status: AC
  Filled 2023-09-30: qty 10

## 2023-09-30 MED ORDER — ACETAMINOPHEN 325 MG PO TABS
650.0000 mg | ORAL_TABLET | ORAL | Status: DC | PRN
  Administered 2023-10-01: 650 mg via ORAL
  Filled 2023-09-30: qty 2

## 2023-09-30 MED ORDER — CLOPIDOGREL BISULFATE 300 MG PO TABS
ORAL_TABLET | ORAL | Status: AC
  Filled 2023-09-30: qty 1

## 2023-09-30 SURGICAL SUPPLY — 15 items
BALLOON MUSTANG 5.0X20 75 (BALLOONS) IMPLANT
BALLOON MUSTANG 6.0X40 75 (BALLOONS) IMPLANT
CATH ANGIO 5F PIGTAIL 65CM (CATHETERS) IMPLANT
COVER DOME SNAP 22 D (MISCELLANEOUS) IMPLANT
KIT ENCORE 26 ADVANTAGE (KITS) IMPLANT
SET ATX-X65L (MISCELLANEOUS) IMPLANT
SHEATH BRITE TIP 6FR 35CM (SHEATH) IMPLANT
SHEATH PINNACLE 5F 10CM (SHEATH) IMPLANT
SHEATH PINNACLE 6F 10CM (SHEATH) IMPLANT
SHEATH PROBE COVER 6X72 (BAG) IMPLANT
STENT ABSOLUTE PRO 8X60X135 (Permanent Stent) IMPLANT
TAPE SHOOT N SEE (TAPE) IMPLANT
TRAY PV CATH (CUSTOM PROCEDURE TRAY) ×2 IMPLANT
WIRE HITORQ VERSACORE ST 145CM (WIRE) IMPLANT
WIRE VERSACORE LOC 115CM (WIRE) IMPLANT

## 2023-09-30 NOTE — Progress Notes (Signed)
 ACT 158. Fentanyl  25 mg IV given for chronic back pain prior to pull. 22fr sheath removed intact with no bleeding or hematoma noted. Manual pressure applied to site x 35 min per Niki, RN. Will continue to monitor per order and protocol.[name] [name] E

## 2023-09-30 NOTE — Interval H&P Note (Signed)
 History and Physical Interval Note:  09/30/2023 7:35 AM  [name] [name]  has presented today for surgery, with the diagnosis of pad.  The various methods of treatment have been discussed with the patient and family. After consideration of risks, benefits and other options for treatment, the patient has consented to  Procedure(s): Lower Extremity Angiography (N/A) as a surgical intervention.  The patient's history has been reviewed, patient examined, no change in status, stable for surgery.  I have reviewed the patient's chart and labs.  Questions were answered to the patient's satisfaction.     Dorn Lesches

## 2023-09-30 NOTE — Interval H&P Note (Signed)
 History and Physical Interval Note:  09/30/2023 9:06 AM  [name] [name]  has presented today for surgery, with the diagnosis of pad.  The various methods of treatment have been discussed with the patient and family. After consideration of risks, benefits and other options for treatment, the patient has consented to  Procedure(s): Lower Extremity Angiography (N/A) as a surgical intervention.  The patient's history has been reviewed, patient examined, no change in status, stable for surgery.  I have reviewed the patient's chart and labs.  Questions were answered to the patient's satisfaction.     Dorn Lesches

## 2023-10-01 ENCOUNTER — Encounter (HOSPITAL_COMMUNITY): Payer: Self-pay | Admitting: Cardiovascular Disease

## 2023-10-01 ENCOUNTER — Other Ambulatory Visit (HOSPITAL_COMMUNITY): Payer: Self-pay

## 2023-10-01 ENCOUNTER — Telehealth (HOSPITAL_COMMUNITY): Payer: Self-pay | Admitting: Pharmacy Technician

## 2023-10-01 ENCOUNTER — Telehealth (HOSPITAL_COMMUNITY): Payer: Self-pay

## 2023-10-01 DIAGNOSIS — I70212 Atherosclerosis of native arteries of extremities with intermittent claudication, left leg: Secondary | ICD-10-CM | POA: Diagnosis not present

## 2023-10-01 LAB — BASIC METABOLIC PANEL WITH GFR
Anion gap: 9 (ref 5–15)
BUN: 12 mg/dL (ref 6–20)
CO2: 22 mmol/L (ref 22–32)
Calcium: 8.2 mg/dL — ABNORMAL LOW (ref 8.9–10.3)
Chloride: 109 mmol/L (ref 98–111)
Creatinine, Ser: 1.14 mg/dL (ref 0.61–1.24)
GFR, Estimated: >60 mL/min (ref >60)
Glucose, Bld: 127 mg/dL — ABNORMAL HIGH (ref 70–99)
Potassium: 4.5 mmol/L (ref 3.5–5.1)
Sodium: 140 mmol/L (ref 135–145)

## 2023-10-01 LAB — CBC
HCT: 43.1 % (ref 39.0–52.0)
Hemoglobin: 14.4 g/dL (ref 13.0–17.0)
MCH: 29.1 pg (ref 26.0–34.0)
MCHC: 33.4 g/dL (ref 30.0–36.0)
MCV: 87.1 fL (ref 80.0–100.0)
Platelets: 217 K/uL (ref 150–400)
RBC: 4.95 MIL/uL (ref 4.22–5.81)
RDW: 14.9 % (ref 11.5–15.5)
WBC: 10.8 K/uL — ABNORMAL HIGH (ref 4.0–10.5)
nRBC: 0 % (ref 0.0–0.2)

## 2023-10-01 LAB — LIPID PANEL
Cholesterol: 154 mg/dL (ref 0–200)
HDL: 21 mg/dL — ABNORMAL LOW (ref >40)
LDL Cholesterol: 92 mg/dL (ref 0–99)
Total CHOL/HDL Ratio: 7.3 ratio
Triglycerides: 206 mg/dL — ABNORMAL HIGH (ref <150)
VLDL: 41 mg/dL — ABNORMAL HIGH (ref 0–40)

## 2023-10-01 LAB — POCT ACTIVATED CLOTTING TIME: Activated Clotting Time: 158 s

## 2023-10-01 MED FILL — Lidocaine HCl Local Preservative Free (PF) Inj 1%: INTRAMUSCULAR | Qty: 30 | Status: AC

## 2023-10-01 NOTE — TOC CM/SW Note (Signed)
 Transition of Care [facility]) - Inpatient Brief Assessment   Patient Details  Name: [name] [name] MRN: [id number] Date of Birth: [birth year]  Transition of Care [facility]) CM/SW Contact:    Sudie Erminio Deems, RN Phone Number: 10/01/2023, 12:14 PM   Clinical Narrative: Patient presented for claudication in peripheral vascular disease. Patient has insurance; however, no PCP. Inpatient Case Manager discussed with patient about arranging a PCP before discharge-patient declines. Patient states he will follow up with Cardiology- does not want a PCP arranged at this time. No further home needs identified at this time.    Transition of Care Asessment: Insurance and Status: Insurance coverage has been reviewed Patient has primary care physician:  (Patient states he will follow up with Cardiology- does not want a PCP arranged.) Home environment has been reviewed: reviewed Prior level of function:: independent Prior/Current Home Services: No current home services Social Drivers of Health Review: SDOH reviewed no interventions necessary Readmission risk has been reviewed: Yes Transition of care needs: no transition of care needs at this time

## 2023-10-01 NOTE — Telephone Encounter (Signed)
 Patient Product/process development scientist completed.    The patient is insured through [facility].     Ran test claim for Repatha Sureclick 140 mg/ml  and Requires Prior Authorization.   This test claim was processed through [facility]- copay amounts may vary at other pharmacies due to pharmacy/plan contracts, or as the patient moves through the different stages of their insurance plan.     Roland Earl, CPHT Pharmacy Technician III Certified Patient Advocate [facility] Patient Advocate Team Direct Number: [phone]  Fax: [phone]

## 2023-10-01 NOTE — Telephone Encounter (Signed)
 PHARMACIST LIPID MONITORING  [name] [name] is a 53 y.o. y.o. adult admitted on 09/30/23 with PAD s/p right lower extremity angiography with PTA and self-expanding stenting .  Pharmacy has been consulted to optimize lipid-lowering therapy with the indication of secondary prevention for clinical ASCVD.   Recent Labs: Lipid Panel:     Component Value Date/Time   CHOL 154 10/01/2023 0418   CHOL 206 (H) 08/31/2023 1526   TRIG 206 (H) 10/01/2023 0418   HDL 21 (L) 10/01/2023 0418   HDL 27 (L) 08/31/2023 1526   CHOLHDL 7.3 10/01/2023 0418   VLDL 41 (H) 10/01/2023 0418   LDLCALC 92 10/01/2023 0418   LDLCALC 138 (H) 08/31/2023 1526   LDLDIRECT 140 (H) 08/31/2023 1526   LDLDIRECT 134.0 03/15/2015 1442    Hepatic function panel:     Component Value Date/Time   AST 55 (H) 08/31/2023 1526   ALT 13 08/31/2023 1526   ALKPHOS 113 08/31/2023 1526   BILITOT 0.4 08/31/2023 1526   BILIDIR <0.10 02/25/2022 1206    Serum Creatinine  Creat (mg/dL)  Date Value  94/87/7982 1.22   Creatinine, Ser (mg/dL)  Date Value  90/87/7974 1.14   Estimated CrCl of Estimated Creatinine Clearance: 75.8 mL/min (by C-G formula based on SCr of 1.14 mg/dL).  Current therapy and lipid therapy tolerance: Current lipid-lowering therapy: atorvastatin  80  Previous lipid-lowering therapies (if applicable): Repatha . Stated this medication was too expensive and it looks like a PA may be required. Messaged [facility] for PA on 9/12.  Documented or reported allergies or intolerances to lipid-lowering therapies (if applicable): none reported    Assessment:    Patient is  Statin-tolerant Baseline LDL: 92 (140 back in August 2025) Major ASCVD events: Symptomatic PAD (ABI < 0.85, underwent revascularization, or underwent amputation)  High-Risk Conditions: Hypertension  Plan:    Statin intensity: No statin changes.  The patient is already on high intensity statin. Refer to lipid clinic:   Yes

## 2023-10-01 NOTE — Discharge Summary (Addendum)
 Discharge Summary   Patient ID: [name] [name] MRN: [id number]; DOB: 12/02/1970  Admit date: 09/30/2023 Discharge date: 10/01/2023  PCP:  Patient, No Pcp Per   [facility] Providers Cardiologist:  Dorn Lesches, MD     Discharge Diagnoses  Principal Problem:   Claudication in peripheral vascular disease [facility]) Active Problems:   HLD (hyperlipidemia)   Essential hypertension   Coronary artery disease involving native heart without angina pectoris   Diagnostic Studies/Procedures   Abdominal aortogram 09/30/23: Final Impression: Patent left common and external iliac artery stent as well as right common and external iliac stent with a new high-grade lesion in the mid right extrailiac artery which underwent PTA and self-expanding stenting.  This was complicated by a long linear dissection related to predilatation with a 5 mm balloon suggesting that his vessels are very fragile.  I covered the entire dissected segment and he had excellent flow.  I will be to remove the sheath once ACT falls below 170 pressure will be held.  He will be treated with DAPT indefinitely.  Will hydrate him overnight, discharged him home in the morning.  He will get Doppler studies #5 no history of office next week and I will see him back 2 to 3 weeks after.  He left labs stable condition.   _____________   History of Present Illness   [name] [name] is a 53 y.o. male with a has a history of acute tobacco abuse having smoked 40-60 pack years (quit 1 month ago), treated hypertension and hyperlipidemia. He has never had a heart attack or stroke and denies chest pain or shortness of breath.   Per Dr. Lesches: He saw Dr. Margaretann  years ago and ultimately underwent angiography/ 05/16/13 revealing bilateral iliac disease both of which were stented with balloon expandable and self expanding stents. He enjoyed symptomatic relief for approximately one year and then developed recurrent left calf claudication. Recent  Dopplers performed 05/02/15 revealed a left ABI 0.78 with a high-frequency signal in his left external iliac artery. I performed lower extremity angiography on him 06/06/15 revealing a patent left common and external iliac artery stent with a 95% stenosis at the distal edge of the proximal stent which I restented with a 9 mm x 3 cm nitinol self expanding stent. His symptoms of claudication have resolved and his Dopplers have normalized. He has in addition stop smoking by his account. He had a left main stent placed in [facility] August of last year and underwent repeat cardiac catheterization by Dr. 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  He underwent coronary artery bypass grafting x3 by Dr. Army 04/07/2017 with a [name] to the LAD and vein to [name] and 2.  He is done well since.  He has stopped smoking.     I performed peripheral angiography and intervention on him 11/07/2017 with stenting of his right common iliac artery with a 7 mm x 29 mm long balloon expandable stent.  The previous stent I placed in his left leg were widely patent angiographically.  His follow-up Doppler studies normalized and his claudication has resolved.   He had developed progressive right lower extremity claudication with Doppler studies performed 05/12/2019 revealing a decline in his right ABI to 0.7 with high-frequency signals in his right common and external iliac artery suggesting progression of disease and/or in-stent restenosis.  He does continue to smoke.  Unfortunately, he was out of all of his medicines for 8 months  up until recently when he restarted those.  He did have a 2D echo performed 05/09/2019 revealing slightly lower EF than in the past in the 45 to 50% range with an inferoapical Wiederhold motion abnormality.   I performed peripheral angiography on him 05/29/2019 revealing high-grade disease in his right common extra iliac artery both of which were restented.  He had a VBX  stent placed in his right common iliac artery and a nitinol self-expanding stent in his external iliac artery.  His right ABI increased from 0.7 up to 0.98.  His claudication has resolved.   He has developed progressive right lower extremity discomfort consistent with claudication.  He is also seeing a neurologist and orthopedic surgeon/physiatrist.  He denies chest pain or shortness of breath.  He apparently stopped smoking a month ago.  He did have lower extremity actual Doppler studies performed 05/06/22 revealing a decline in his right ABI to 0.86 high-frequency signal in his right iliac artery.   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  I did refer him to Dr. Medford Gaskins who performed TCAR of his left internal carotid artery 07/07/2023.  I did perform Myoview  stress testing on him preoperatively 06/03/2023 which is low risk and nonischemic.  The patient still has lifestyle-limiting claudication right greater than left and wishes to proceed with outpatient peripheral angiography.      Hospital Course   Consultants: none  PAD He presented for OP abdominal aortogram. As above, patent left common and external iliac artery stent as well as right common and external iliac stent. He had a new high grade lesion in the mid right external iliac artery successfully treated with PTA and self-expanding stent. Procedure was complicated by a long linear dissection related to predilatation suggesting vessels are for very fragile.  Dr. Court was able to cover the entire dissected segment resulting in excellent flow.  Continue DAPT with aspirin  and Plavix .  Postprocedure Dopplers and follow-up appointment have been arranged.   Hyperlipidemia with LDL goal less than 55 LDL not controlled at 92 on 80 mg of atorvastatin .  Will refer to lipid clinic to follow-up on PA submitted for Repatha . He states he has missed doses of repatha  due to  affordability. Later states he has it at home but not taking it.   Hypertension - Continue 3.125 mg carvedilol  twice daily   Groin sites without hematoma.  CBC stable.      Did the patient have an acute coronary syndrome (MI, NSTEMI, STEMI, etc) this admission?:  No                               Did the patient have a percutaneous coronary intervention (stent / angioplasty)?:  No.        The patient will be scheduled for a TOC follow up appointment in 7-14 days.  A message has been sent to the [facility] and Scheduling Pool at the office where the patient should be seen for follow up.  _____________  Discharge Vitals Blood pressure 108/66, pulse 64, temperature 98.2 F (36.8 C), temperature source Oral, resp. rate 16, height 5' 9 (1.753 m), weight 80.7 kg, SpO2 95%.  Filed Weights   09/30/23 0707 09/30/23 1743  Weight: 80.7 kg 80.7 kg    Physical Exam   General: Well developed, well nourished, male appearing in no acute distress. Head: Normocephalic, atraumatic.  Neck: no JVD.  Lungs:  Resp regular and unlabored, CTA. Heart: RRR, S1, S2, no S3, S4, or murmur; no rub. Abdomen: Soft, non-tender, non-distended with normoactive bowel sounds. No hepatomegaly. No rebound/guarding. No obvious abdominal masses. Extremities: no edema.  Neuro: Alert and oriented X 3. Moves all extremities spontaneously. Psych: Normal affect. Right groin tender without hematoma   Labs & Radiologic Studies  CBC Recent Labs    10/01/23 0418  WBC 10.8*  HGB 14.4  HCT 43.1  MCV 87.1  PLT 217   Basic Metabolic Panel Recent Labs    90/87/74 0418  NA 140  K 4.5  CL 109  CO2 22  GLUCOSE 127*  BUN 12  CREATININE 1.14  CALCIUM  8.2*   Liver Function Tests No results for input(s): AST, ALT, ALKPHOS, BILITOT, PROT, ALBUMIN  in the last 72 hours. No results for input(s): LIPASE, AMYLASE in the last 72 hours. High Sensitivity Troponin:   No results for input(s): TROPONINIHS  in the last 720 hours.  No results for input(s): TRNPT in the last 720 hours.  BNP Invalid input(s): POCBNP No results for input(s): PROBNP in the last 72 hours.  No results for input(s): BNP in the last 72 hours.  D-Dimer No results for input(s): DDIMER in the last 72 hours. Hemoglobin A1C No results for input(s): HGBA1C in the last 72 hours. Fasting Lipid Panel Recent Labs    10/01/23 0418  CHOL 154  HDL 21*  LDLCALC 92  TRIG 793*  CHOLHDL 7.3   No results found for: LIPOA  Thyroid  Function Tests No results for input(s): TSH, T4TOTAL, T3FREE, THYROIDAB in the last 72 hours.  Invalid input(s): FREET3 _____________  PERIPHERAL VASCULAR CATHETERIZATION Result Date: 09/30/2023 Images from the original result were not included.  [id number] LOCATION:  FACILITY: [facility] PHYSICIAN: Dorn Lesches, M.D. [birth year] DATE OF PROCEDURE:  09/30/2023 DATE OF DISCHARGE: PV Angiogram/Intervention History obtained from chart review.DAMIR LEUNG is a 53 y.o.  single Caucasian male, father of one 62 year old son who is referred by Angeline Laura registered ,nurse practitioner, for peripheral vascular evaluation. I last saw him in the 05/24/2023.   He has a history of acute tobacco abuse having smoked 40-60 pack years (quit 1 month ago), treated hypertension and hyperlipidemia. She has never had a heart attack or stroke and denies chest pain or shortness of breath. He saw Dr. Margaretann  years ago and ultimately underwent angiography/ 05/16/13 revealing bilateral iliac disease both of which were stented with balloon expandable and self expanding stents. He enjoyed symptomatic relief for approximately one year and then developed recurrent left calf claudication. Recent Dopplers performed 05/02/15 revealed a left ABI 0.78 with a high-frequency signal in his left external iliac artery. I performed lower extremity angiography on him 06/06/15 revealing a patent left common and external iliac artery stent  with a 95% stenosis at the distal edge of the proximal stent which I restented with a 9 mm x 3 cm nitinol self expanding stent. His symptoms of claudication have resolved and his Dopplers have normalized. He has in addition stop smoking by his account. He had a left main stent placed in [facility] August of last year and underwent repeat cardiac catheterization by Dr. 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  He underwent coronary artery bypass grafting x3 by Dr. Army 04/07/2017 with a [name] to the LAD and vein to [name] and 2.  He is done well since.  He has stopped smoking.   I performed  peripheral angiography and intervention on him 11/07/2017 with stenting of his right common iliac artery with a 7 mm x 29 mm long balloon expandable stent.  The previous stent I placed in his left leg were widely patent angiographically.  His follow-up Doppler studies normalized and his claudication has resolved.  He had developed progressive right lower extremity claudication with Doppler studies performed 05/12/2019 revealing a decline in his right ABI to 0.7 with high-frequency signals in his right common and external iliac artery suggesting progression of disease and/or in-stent restenosis.  He does continue to smoke.  Unfortunately, he was out of all of his medicines for 8 months up until recently when he restarted those.  He did have a 2D echo performed 05/09/2019 revealing slightly lower EF than in the past in the 45 to 50% range with an inferoapical Odriscoll motion abnormality.  I performed peripheral angiography on him 05/29/2019 revealing high-grade disease in his right common extra iliac artery both of which were restented.  He had a VBX stent placed in his right common iliac artery and a nitinol self-expanding stent in his external iliac artery.  His right ABI increased from 0.7 up to 0.98.  His claudication has resolved.  He has developed progressive right lower extremity  discomfort consistent with claudication.  He is also seeing a neurologist and orthopedic surgeon/physiatrist.  He denies chest pain or shortness of breath.  He apparently stopped smoking a month ago.  He did have lower extremity actual Doppler studies performed 05/06/22 revealing a decline in his right ABI to 0.86 high-frequency signal in his right iliac artery.  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  I did refer him to Dr. Medford Gaskins who performed TCAR of his left internal carotid artery 07/07/2023.  I did perform Myoview  stress testing on him preoperatively 06/03/2023 which is low risk and nonischemic.  The patient still has lifestyle-limiting claudication right greater than left and wishes to proceed with outpatient peripheral angiography. Pre Procedure Diagnosis: Peripheral arterial disease Post Procedure Diagnosis: Peripheral arterial disease Operators: Dr. Dorn Lesches Procedures Performed:  1.  Ultrasound-guided right common femoral access  2.  Abdominal aortogram/bilateral iliac angiogram  3.  PTA right extrailiac artery  4.  Abbott absolute Pro nitinol self-expanding stenting of the right extrailiac artery. PROCEDURE DESCRIPTION: The patient was brought to the second floor [facility] lab in the the postabsorptive state. He was premedicated with IV Versed  and fentanyl . His right groin was prepped and shaved in usual sterile fashion. Xylocaine  1% was used for local anesthesia. A 5 French sheath was inserted into the right common femoral artery using standard Seldinger technique.  Pigtail catheter was placed in the distal abdominal aorta.  Distal abdominal aortography with bilateral iliac artery angiography was performed.  Omnipaque guide was used for the entirety of the case (55 cc of contrast total to patient).  Retrograde pressures monitored in the case.  Angiographic Data: 1: Abdominal aorta-widely patent 2: Left  lower extremity-the left common and external iliac artery stents are widely patent 3: Right lower extremity-40% in-stent restenosis within the previously placed proximal right extrailiac artery stent.  90% fairly focal mid right extrailiac artery stenosis.   Mr. Rockers has patent iliac stents with a high-grade right external iliac artery stenosis consistent with his symptoms on his Doppler findings.  Will proceed with PTA and self-expanding stenting. Procedure Description: The existing 5 French sheath was exchanged over wire for a 6  French 35 cm long Brite tip sheath.  The patient received 9000's of heparin  with ACT of 285.  I predilated the extrailiac artery stenosis with a 5 mm x 2 cm balloon at low pressures.  This resulted in expansion of the lesion but fairly extensive dissection down to just above the femoral head.  I then stented the entire right extrailiac artery with a 8 mm x 60 mm long Abbott absolute Pro nitinol self-expanding stent covering the entire bisected segment and postdilated with a 6 mm x 4 cm Maverick balloon in the proximal portion of the stent that was overlapping the prior stent in excellent angiographic result.  The sheath was then exchanged over wire for a short stent 6 French sheath was which was then sewn securely in place.  Patient received an additional 300 mg of p.o. clopidogrel . Final Impression: Patent left common and external iliac artery stent as well as right common and external iliac stent with a new high-grade lesion in the mid right extrailiac artery which underwent PTA and self-expanding stenting.  This was complicated by a long linear dissection related to predilatation with a 5 mm balloon suggesting that his vessels are very fragile.  I covered the entire dissected segment and he had excellent flow.  I will be to remove the sheath once ACT falls below 170 pressure will be held.  He will be treated with DAPT indefinitely.  Will hydrate him overnight, discharged him home in  the morning.  He will get Doppler studies #5 no history of office next week and I will see him back 2 to 3 weeks after.  He left labs stable condition. Dorn Lesches. MD, [facility] 09/30/2023 10:22 AM     Disposition Pt is being discharged home today in good condition.  Follow-up Plans & Appointments    Discharge Medications Allergies as of 10/01/2023       Reactions   Codeine Other (See Comments)   Migraine Other Reaction(s): Migraine Headache        Medication List     STOP taking these medications    amitriptyline  10 MG tablet Commonly known as: ELAVIL    oxyCODONE -acetaminophen  5-325 MG tablet Commonly known as: PERCOCET/ROXICET   topiramate  50 MG tablet Commonly known as: Topamax        TAKE these medications    aspirin  EC 81 MG tablet Take 1 tablet (81 mg total) by mouth daily.   atorvastatin  80 MG tablet Commonly known as: LIPITOR  Take 1 tablet (80 mg total) by mouth at bedtime.   carvedilol  3.125 MG tablet Commonly known as: Coreg  Take 1 tablet (3.125 mg total) by mouth 2 (two) times daily.   clopidogrel  75 MG tablet Commonly known as: PLAVIX  Take 1 tablet (75 mg total) by mouth daily.   Repatha  SureClick 140 MG/ML Soaj Generic drug: Evolocumab  Inject 140 mg into the skin every 14 (fourteen) days.   triamcinolone  0.025 % ointment Commonly known as: KENALOG  Apply 1 Application topically 2 (two) times daily.         Outstanding Labs/Studies none  Duration of Discharge Encounter: APP Time: 20 minutes   Signed, Jon Nat Hails, PA 10/01/2023, 10:26 AM

## 2023-10-01 NOTE — Progress Notes (Signed)
 Mobility Specialist Progress Note:   10/01/23 0920  Mobility  Activity Ambulated independently  Level of Assistance Modified independent, requires aide device or extra time  Assistive Device None  Distance Ambulated (ft) 200 ft  Activity Response Tolerated well  Mobility Referral Yes  Mobility visit 1 Mobility  Mobility Specialist Start Time (ACUTE ONLY) 0920  Mobility Specialist Stop Time (ACUTE ONLY) 0930  Mobility Specialist Time Calculation (min) (ACUTE ONLY) 10 min   Pt ambulating at a modI level. C/o generalized fatigue and chronic back pain. No physical assistance throughout. Left ambulating ad lib in room. Encouraged frequent ambulation.  Therisa Rana Mobility Specialist Please contact via SecureChat or  Rehab office at [phone]

## 2023-10-04 ENCOUNTER — Other Ambulatory Visit (HOSPITAL_COMMUNITY): Payer: Self-pay

## 2023-10-04 ENCOUNTER — Telehealth: Payer: Self-pay | Admitting: Pharmacy Technician

## 2023-10-04 LAB — POCT ACTIVATED CLOTTING TIME
Activated Clotting Time: 256 s
Activated Clotting Time: 285 s

## 2023-10-04 NOTE — Telephone Encounter (Signed)
 Pharmacy Patient Advocate Encounter   Received notification from Physician's Office that prior authorization for Repatha  is required/requested.   Insurance verification completed.   The patient is insured through [employer] .   Per test claim: PA required; PA submitted to above mentioned insurance via Latent Key/confirmation #/EOC [id number] Status is pending

## 2023-10-04 NOTE — Telephone Encounter (Signed)
 Pharmacy Patient Advocate Encounter  Received notification from [employer] that Prior Authorization for Repatha  has been APPROVED from 10/04/23 to 10/03/24. Ran test claim, Copay is $4.00- one month. This test claim was processed through [facility]- copay amounts may vary at other pharmacies due to pharmacy/plan contracts, or as the patient moves through the different stages of their insurance plan.   PA #/Case ID/Reference #: [id number]

## 2023-10-06 ENCOUNTER — Ambulatory Visit (HOSPITAL_BASED_OUTPATIENT_CLINIC_OR_DEPARTMENT_OTHER)
Admission: RE | Admit: 2023-10-06 | Discharge: 2023-10-06 | Disposition: A | Discharge status: Home / Self Care | Source: Ambulatory Visit | Attending: Cardiovascular Disease | Admitting: Cardiovascular Disease

## 2023-10-06 ENCOUNTER — Ambulatory Visit (HOSPITAL_COMMUNITY)
Admission: RE | Admit: 2023-10-06 | Discharge: 2023-10-06 | Disposition: A | Discharge status: Home / Self Care | Source: Ambulatory Visit | Attending: Cardiovascular Disease | Admitting: Cardiovascular Disease

## 2023-10-06 DIAGNOSIS — I739 Peripheral vascular disease, unspecified: Secondary | ICD-10-CM

## 2023-10-06 DIAGNOSIS — Z95820 Peripheral vascular angioplasty status with implants and grafts: Secondary | ICD-10-CM | POA: Diagnosis not present

## 2023-10-07 LAB — VAS US ABI WITH/WO TBI
Left ABI: 1.03
Right ABI: 1.03

## 2023-10-11 ENCOUNTER — Other Ambulatory Visit (HOSPITAL_COMMUNITY): Payer: Self-pay

## 2023-10-11 ENCOUNTER — Encounter: Payer: Self-pay | Admitting: Cardiovascular Disease

## 2023-10-11 ENCOUNTER — Ambulatory Visit: Attending: Cardiovascular Disease | Admitting: Cardiovascular Disease

## 2023-10-11 VITALS — BP 126/72 | HR 62 | Ht 69.0 in | Wt 184.2 lb

## 2023-10-11 DIAGNOSIS — E782 Mixed hyperlipidemia: Secondary | ICD-10-CM | POA: Diagnosis not present

## 2023-10-11 DIAGNOSIS — Z951 Presence of aortocoronary bypass graft: Secondary | ICD-10-CM | POA: Diagnosis not present

## 2023-10-11 DIAGNOSIS — I739 Peripheral vascular disease, unspecified: Secondary | ICD-10-CM | POA: Insufficient documentation

## 2023-10-11 DIAGNOSIS — F17200 Nicotine dependence, unspecified, uncomplicated: Secondary | ICD-10-CM | POA: Diagnosis not present

## 2023-10-11 MED ORDER — REPATHA SURECLICK 140 MG/ML ~~LOC~~ SOAJ
1.0000 mL | SUBCUTANEOUS | 11 refills | Status: AC
  Filled 2023-10-11: qty 2, 28d supply, fill #0
  Filled 2023-11-10: qty 2, 28d supply, fill #1

## 2023-10-11 NOTE — Assessment & Plan Note (Signed)
 History of hyperlipidemia on high-dose statin therapy and Repatha  which she has not been taking.  His most recent lipid profile performed 10/01/2023 revealed a total cholesterol 154, LDL of 92 and HDL of 21.  I have urged him to begin his Repatha  every 2 weeks as prescribed.  Will recheck a lipid liver profile in 3 months.

## 2023-10-11 NOTE — Assessment & Plan Note (Signed)
 History of PAD status post multiple lower extremity interventions with bilateral iliac interventions by Dr. Ladona 05/16/13.  By angiogram 1010/20/19 I stented his right common iliac artery with a balloon expandable stent the previous stent in his left leg that I placed was widely patent.  I performed angiography on him 05/29/2019 revealing high-grade disease in his right common iliac artery both of which have been stented.  He had a VBX stent placed in his right common iliac artery and a nitinol stent in his right external iliac artery.  His right ABI increased from 0.7-0.98.  Because of recurrent claudication he underwent repeat angiography 09/30/2023 revealing a patent left iliac stent, 40% in-stent restenosis within the right common iliac stent and a 90% fairly focal right external iliac artery stenosis.  I predilated with a 5 mm balloon which resulted in a significantly near dissection which I ultimately stented with an 8 mm x 60 mm long Abbott absolute Pro nitinol self-expanding stent.  He is discharged home the following day.  He has follow-up Dopplers performed 10/05/2024 did continue to show elevated velocities in his right external iliac artery however his ABI increased from 0.78 up to 1.03 and his claudication has resolved.

## 2023-10-11 NOTE — Patient Instructions (Signed)
 Medication Instructions:  Your physician recommends that you continue on your current medications as directed. Please refer to the Current Medication list given to you today.  *If you need a refill on your cardiac medications before your next appointment, please call your pharmacy*  Lab Work: Your physician recommends that you return for lab work in: 3 months for FASTING lipid/liver panel   If you have labs (blood work) drawn today and your tests are completely normal, you will receive your results only by: MyChart Message (if you have MyChart) OR A paper copy in the mail If you have any lab test that is abnormal or we need to change your treatment, we will call you to review the results.  Testing/Procedures: Your physician has requested that you have a lower extremity arterial duplex. During this test, ultrasound is used to evaluate arterial blood flow in the legs. Allow one hour for this exam. There are no restrictions or special instructions. This will take place at [address] 4th floor  **To do in March 2026**  Please note: We ask at that you not bring children with you during ultrasound (echo/ vascular) testing. Due to room size and safety concerns, children are not allowed in the ultrasound rooms during exams. Our front office staff cannot provide observation of children in our lobby area while testing is being conducted. An adult accompanying a patient to their appointment will only be allowed in the ultrasound room at the discretion of the ultrasound technician under special circumstances. We apologize for any inconvenience.   Your physician has requested that you have an ankle brachial index (ABI). During this test an ultrasound and blood pressure cuff are used to evaluate the arteries that supply the arms and legs with blood. Allow thirty minutes for this exam. There are no restrictions or special instructions. This will take place at [address] 4th floor  **To do in March  2026**   Please note: We ask at that you not bring children with you during ultrasound (echo/ vascular) testing. Due to room size and safety concerns, children are not allowed in the ultrasound rooms during exams. Our front office staff cannot provide observation of children in our lobby area while testing is being conducted. An adult accompanying a patient to their appointment will only be allowed in the ultrasound room at the discretion of the ultrasound technician under special circumstances. We apologize for any inconvenience.   Follow-Up: At [facility], you and your health needs are our priority.  As part of our continuing mission to provide you with exceptional heart care, our providers are all part of one team.  This team includes your primary Cardiologist (physician) and Advanced Practice Providers or APPs (Physician Assistants and Nurse Practitioners) who all work together to provide you with the care you need, when you need it.  Your next appointment:   6 month(s)  Provider:   Dorn Lesches, MD

## 2023-10-11 NOTE — Assessment & Plan Note (Signed)
 Long history of tobacco use having recently discontinued smoking at my request

## 2023-10-11 NOTE — Progress Notes (Signed)
 10/11/2023 [name] [name]   Apr 01, 1970  [id number]  Primary Physician Patient, No Pcp Per Primary Cardiologist: Dorn JINNY Lesches MD GENI CODY LYNITA ILAH  HPI:  [name] [name] is a 53 y.o.  single Caucasian male, father of one 62 year old son who is referred by Angeline Laura registered ,nurse practitioner, for peripheral vascular evaluation. I last saw him in the 08/31/2023.   He has a history of acute tobacco abuse having smoked 40-60 pack years (quit 1 month ago), treated hypertension and hyperlipidemia. She has never had a heart attack or stroke and denies chest pain or shortness of breath. He saw Dr. Margaretann  years ago and ultimately underwent angiography/ 05/16/13 revealing bilateral iliac disease both of which were stented with balloon expandable and self expanding stents. He enjoyed symptomatic relief for approximately one year and then developed recurrent left calf claudication. Recent Dopplers performed 05/02/15 revealed a left ABI 0.78 with a high-frequency signal in his left external iliac artery. I performed lower extremity angiography on him 06/06/15 revealing a patent left common and external iliac artery stent with a 95% stenosis at the distal edge of the proximal stent which I restented with a 9 mm x 3 cm nitinol self expanding stent. His symptoms of claudication have resolved and his Dopplers have normalized. He has in addition stop smoking by his account. He had a left main stent placed in [facility] August of last year and underwent repeat cardiac catheterization by Dr. 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  He underwent coronary artery bypass grafting x3 by Dr. Army 04/07/2017 with a [name] to the LAD and vein to [name] and 2.  He is done well since.  He has stopped smoking.     I performed peripheral angiography and intervention on him 11/07/2017 with stenting of his right common iliac artery with a 7 mm x 29 mm long balloon  expandable stent.  The previous stent I placed in his left leg were widely patent angiographically.  His follow-up Doppler studies normalized and his claudication has resolved.   He had developed progressive right lower extremity claudication with Doppler studies performed 05/12/2019 revealing a decline in his right ABI to 0.7 with high-frequency signals in his right common and external iliac artery suggesting progression of disease and/or in-stent restenosis.  He does continue to smoke.  Unfortunately, he was out of all of his medicines for 8 months up until recently when he restarted those.  He did have a 2D echo performed 05/09/2019 revealing slightly lower EF than in the past in the 45 to 50% range with an inferoapical Hineman motion abnormality.   I performed peripheral angiography on him 05/29/2019 revealing high-grade disease in his right common extra iliac artery both of which were restented.  He had a VBX stent placed in his right common iliac artery and a nitinol self-expanding stent in his external iliac artery.  His right ABI increased from 0.7 up to 0.98.  His claudication has resolved.   He has developed progressive right lower extremity discomfort consistent with claudication.  He is also seeing a neurologist and orthopedic surgeon/physiatrist.  He denies chest pain or shortness of breath.  He apparently stopped smoking a month ago.  He did have lower extremity actual Doppler studies performed 05/06/22 revealing a decline in his right ABI to 0.86 high-frequency signal in his right iliac artery.   Carotid Dopplers performed 05/10/23 showed an occluded right internal carotid artery  with high-grade left ICA stenosis.  I did refer him to Dr. Medford Gaskins who performed TCAR of his left internal carotid artery 07/07/2023.  I did perform Myoview  stress testing on him preoperatively 06/03/2023 which is low risk and nonischemic.  Because of lifestyle-limiting claudication right greater than left we decided to  proceed with outpatient peripheral angiography on 09/30/2023 which revealed a patent left iliac stent, 40% in-stent restenosis within his right common iliac artery stent and a 90% right external iliac artery stent.  I predilated this with a 5 mm balloon resulting in long linear dissection which ultimately stented with a 8 mm x 60 mm long Abbott nitinol absolute Pro self-expanding stent successfully.  He was discharged home the following day.  His ABIs improved from 0.78 up to 1.03 on 10/06/2023 and his claudication has resolved.   Current Meds  Medication Sig   aspirin  EC 81 MG tablet Take 1 tablet (81 mg total) by mouth daily.   atorvastatin  (LIPITOR ) 80 MG tablet Take 1 tablet (80 mg total) by mouth at bedtime.   carvedilol  (COREG ) 3.125 MG tablet Take 1 tablet (3.125 mg total) by mouth 2 (two) times daily.   clopidogrel  (PLAVIX ) 75 MG tablet Take 1 tablet (75 mg total) by mouth daily.   triamcinolone  (KENALOG ) 0.025 % ointment Apply 1 Application topically 2 (two) times daily.   [DISCONTINUED] Evolocumab  (REPATHA  SURECLICK) 140 MG/ML SOAJ Inject 140 mg into the skin every 14 (fourteen) days.     Allergies  Allergen Reactions   Codeine Other (See Comments)    Migraine  Other Reaction(s): Migraine Headache    Social History   Socioeconomic History   Marital status: Divorced    Spouse name: Not on file   Number of children: Not on file   Years of education: Not on file   Highest education level: Not on file  Occupational History   Occupation: [employer] doctor  Tobacco Use   Smoking status: Former    Average packs/day: 1 pack/day for 30.0 years (30.0 ttl pk-yrs)    Types: Cigarettes    Start date: 06/06/1985    Quit date: 06/07/2015    Years since quitting: 8.3   Smokeless tobacco: Former    Types: Snuff   Tobacco comments:    used snuff in my teens  Vaping Use   Vaping status: Former  Substance and Sexual Activity   Alcohol use: Not Currently    Comment: 06/06/2015 might have  1 beer/month   Drug use: No   Sexual activity: Not Currently  Other Topics Concern   Not on file  Social History Narrative   Not on file   Social Drivers of Health   Financial Resource Strain: High Risk (12/12/2019)   Overall Financial Resource Strain (CARDIA)    Difficulty of Paying Living Expenses: Very hard  Food Insecurity: No Food Insecurity (09/30/2023)   Hunger Vital Sign    Worried About Running Out of Food in the Last Year: Never true    Ran Out of Food in the Last Year: Never true  Transportation Needs: No Transportation Needs (09/30/2023)   PRAPARE - Administrator, Civil Service (Medical): No    Lack of Transportation (Non-Medical): No  Physical Activity: Not on file  Stress: No Stress Concern Present (09/13/2023)   [employer] of Occupational Health - Occupational Stress Questionnaire    Feeling of Stress: Not at all  Social Connections: Not on file  Intimate Partner Violence: Not At Risk (09/30/2023)  Humiliation, Afraid, Rape, and Kick questionnaire    Fear of Current or Ex-Partner: No    Emotionally Abused: No    Physically Abused: No    Sexually Abused: No     Review of Systems: General: negative for chills, fever, night sweats or weight changes.  Cardiovascular: negative for chest pain, dyspnea on exertion, edema, orthopnea, palpitations, paroxysmal nocturnal dyspnea or shortness of breath Dermatological: negative for rash Respiratory: negative for cough or wheezing Urologic: negative for hematuria Abdominal: negative for nausea, vomiting, diarrhea, bright red blood per rectum, melena, or hematemesis Neurologic: negative for visual changes, syncope, or dizziness All other systems reviewed and are otherwise negative except as noted above.    Blood pressure 126/72, pulse 62, height 5' 9 (1.753 m), weight 184 lb 3.2 oz (83.6 kg), SpO2 98%.  General appearance: alert and no distress Neck: no adenopathy, no carotid bruit, no JVD, supple,  symmetrical, trachea midline, and thyroid  not enlarged, symmetric, no tenderness/mass/nodules Lungs: clear to auscultation bilaterally Heart: regular rate and rhythm, S1, S2 normal, no murmur, click, rub or gallop Extremities: extremities normal, atraumatic, no cyanosis or edema Pulses: 2+ and symmetric Skin: Skin color, texture, turgor normal. No rashes or lesions Neurologic: Grossly normal  EKG not performed today      ASSESSMENT AND PLAN:   Tobacco dependence Long history of tobacco use having recently discontinued smoking at my request  HLD (hyperlipidemia) History of hyperlipidemia on high-dose statin therapy and Repatha  which she has not been taking.  His most recent lipid profile performed 10/01/2023 revealed a total cholesterol 154, LDL of 92 and HDL of 21.  I have urged him to begin his Repatha  every 2 weeks as prescribed.  Will recheck a lipid liver profile in 3 months.  PAD (peripheral artery disease) History of PAD status post multiple lower extremity interventions with bilateral iliac interventions by Dr. Ladona 05/16/13.  By angiogram 1010/20/19 I stented his right common iliac artery with a balloon expandable stent the previous stent in his left leg that I placed was widely patent.  I performed angiography on him 05/29/2019 revealing high-grade disease in his right common iliac artery both of which have been stented.  He had a VBX stent placed in his right common iliac artery and a nitinol stent in his right external iliac artery.  His right ABI increased from 0.7-0.98.  Because of recurrent claudication he underwent repeat angiography 09/30/2023 revealing a patent left iliac stent, 40% in-stent restenosis within the right common iliac stent and a 90% fairly focal right external iliac artery stenosis.  I predilated with a 5 mm balloon which resulted in a significantly near dissection which I ultimately stented with an 8 mm x 60 mm long Abbott absolute Pro nitinol self-expanding stent.   He is discharged home the following day.  He has follow-up Dopplers performed 10/05/2024 did continue to show elevated velocities in his right external iliac artery however his ABI increased from 0.78 up to 1.03 and his claudication has resolved.     Dorn DOROTHA Lesches MD FACP,FACC,FAHA, [facility] 10/11/2023 3:33 PM

## 2023-10-18 ENCOUNTER — Other Ambulatory Visit: Payer: Self-pay

## 2023-11-04 ENCOUNTER — Ambulatory Visit: Payer: Self-pay | Admitting: Student

## 2023-11-04 ENCOUNTER — Encounter: Payer: Self-pay | Admitting: Student

## 2023-11-04 ENCOUNTER — Other Ambulatory Visit (HOSPITAL_COMMUNITY): Payer: Self-pay

## 2023-11-04 ENCOUNTER — Other Ambulatory Visit: Payer: Self-pay

## 2023-11-04 VITALS — BP 137/79 | HR 66 | Ht 69.0 in | Wt 184.8 lb

## 2023-11-04 DIAGNOSIS — M545 Low back pain, unspecified: Secondary | ICD-10-CM | POA: Diagnosis not present

## 2023-11-04 DIAGNOSIS — G8929 Other chronic pain: Secondary | ICD-10-CM

## 2023-11-04 MED ORDER — METHOCARBAMOL 500 MG PO TABS
500.0000 mg | ORAL_TABLET | Freq: Four times a day (QID) | ORAL | 2 refills | Status: AC | PRN
  Filled 2023-11-04: qty 60, 15d supply, fill #0

## 2023-11-04 MED ORDER — LIDOCAINE 5 % EX PTCH
1.0000 | MEDICATED_PATCH | CUTANEOUS | 0 refills | Status: AC
  Filled 2023-11-04: qty 30, 30d supply, fill #0
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
## 2023-11-04 NOTE — Assessment & Plan Note (Signed)
 Lumbar area bilaterally, but not midline or bony and no radiculopathy or emergency neurologic symptoms. Upper mid back between shoulder blades. Duration is several months. No inciting factor or trauma. No sudden changes. Got an MRI in march with concern of radiculopathy, showed now nerve involvement, just mild degenerative disk disease. Self treatment with OTC methods not helpful. 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  Suspect overuse/arthritis. Not something amenable to injections or surgery. Will start with PT and muscle relaxers.  - Ref to PT - Robaxin  500 q6 prn - Lidocaine  patches - Rec aerobic activity and movement which is good for back pain - He is going to consider chiropractic

## 2023-11-04 NOTE — Patient Instructions (Signed)
 Expect a call for physical therapy.  Use robaxin , a muscle relaxer, as needed.  Use tylenol  for additional relief. Some people find that tylenol  helps more alongside other medicines like robaxin  vs when taken alone.  Try to move around. Aerobic exercise is one of the best treatments for back pain.

## 2023-11-05 ENCOUNTER — Encounter (HOSPITAL_COMMUNITY): Payer: Self-pay

## 2023-11-05 ENCOUNTER — Other Ambulatory Visit (HOSPITAL_COMMUNITY): Payer: Self-pay

## 2023-11-07 NOTE — Progress Notes (Signed)
 Internal Medicine Clinic Attending  Case discussed with the resident at the time of the visit.  We reviewed the resident's history and exam and pertinent patient test results.  I agree with the assessment, diagnosis, and plan of care documented in the resident's note.

## 2023-11-10 ENCOUNTER — Other Ambulatory Visit: Payer: Self-pay

## 2023-11-10 ENCOUNTER — Other Ambulatory Visit (HOSPITAL_COMMUNITY): Payer: Self-pay

## 2023-11-10 ENCOUNTER — Other Ambulatory Visit: Payer: Self-pay | Admitting: Internal Medicine

## 2023-11-10 DIAGNOSIS — I25810 Atherosclerosis of coronary artery bypass graft(s) without angina pectoris: Secondary | ICD-10-CM

## 2023-11-10 MED FILL — Carvedilol Tab 3.125 MG: ORAL | 30 days supply | Qty: 60 | Fill #0 | Status: CN

## 2023-11-12 ENCOUNTER — Other Ambulatory Visit (HOSPITAL_COMMUNITY): Payer: Self-pay

## 2023-11-12 ENCOUNTER — Other Ambulatory Visit: Payer: Self-pay | Admitting: Internal Medicine

## 2023-11-12 DIAGNOSIS — I25810 Atherosclerosis of coronary artery bypass graft(s) without angina pectoris: Secondary | ICD-10-CM

## 2023-11-16 ENCOUNTER — Other Ambulatory Visit (HOSPITAL_COMMUNITY): Payer: Self-pay

## 2023-11-22 ENCOUNTER — Other Ambulatory Visit (HOSPITAL_COMMUNITY): Payer: Self-pay

## 2023-11-22 ENCOUNTER — Encounter: Payer: Self-pay | Admitting: Radiology

## 2023-12-08 ENCOUNTER — Other Ambulatory Visit (HOSPITAL_COMMUNITY): Payer: Self-pay

## 2023-12-08 MED FILL — Carvedilol Tab 3.125 MG: ORAL | 30 days supply | Qty: 60 | Fill #0 | Status: AC

## 2024-03-30 ENCOUNTER — Encounter (HOSPITAL_COMMUNITY)
